# Patient Record
Sex: Female | Born: 1935 | Race: White | Hispanic: No | Smoking: Never smoker
Health system: Southern US, Community
[De-identification: ages and names within clinical notes are randomized; demographics above are authoritative.]

## PROBLEM LIST (undated history)

## (undated) DIAGNOSIS — E43 Unspecified severe protein-calorie malnutrition: Secondary | ICD-10-CM

## (undated) DIAGNOSIS — Z8719 Personal history of other diseases of the digestive system: Secondary | ICD-10-CM

## (undated) DIAGNOSIS — N289 Disorder of kidney and ureter, unspecified: Secondary | ICD-10-CM

## (undated) DIAGNOSIS — S32009A Unspecified fracture of unspecified lumbar vertebra, initial encounter for closed fracture: Secondary | ICD-10-CM

## (undated) DIAGNOSIS — K219 Gastro-esophageal reflux disease without esophagitis: Secondary | ICD-10-CM

## (undated) DIAGNOSIS — M4854XA Collapsed vertebra, not elsewhere classified, thoracic region, initial encounter for fracture: Secondary | ICD-10-CM

## (undated) DIAGNOSIS — K5900 Constipation, unspecified: Secondary | ICD-10-CM

## (undated) DIAGNOSIS — E039 Hypothyroidism, unspecified: Secondary | ICD-10-CM

## (undated) DIAGNOSIS — M545 Low back pain, unspecified: Secondary | ICD-10-CM

## (undated) DIAGNOSIS — S32020D Wedge compression fracture of second lumbar vertebra, subsequent encounter for fracture with routine healing: Secondary | ICD-10-CM

## (undated) DIAGNOSIS — D649 Anemia, unspecified: Secondary | ICD-10-CM

## (undated) DIAGNOSIS — M549 Dorsalgia, unspecified: Secondary | ICD-10-CM

## (undated) DIAGNOSIS — E871 Hypo-osmolality and hyponatremia: Secondary | ICD-10-CM

## (undated) DIAGNOSIS — E785 Hyperlipidemia, unspecified: Secondary | ICD-10-CM

## (undated) DIAGNOSIS — R4020 Unspecified coma: Secondary | ICD-10-CM

## (undated) DIAGNOSIS — N183 Chronic kidney disease, stage 3 (moderate): Secondary | ICD-10-CM

## (undated) DIAGNOSIS — F329 Major depressive disorder, single episode, unspecified: Secondary | ICD-10-CM

## (undated) DIAGNOSIS — I10 Essential (primary) hypertension: Secondary | ICD-10-CM

## (undated) DIAGNOSIS — I4891 Unspecified atrial fibrillation: Secondary | ICD-10-CM

## (undated) DIAGNOSIS — R55 Syncope and collapse: Secondary | ICD-10-CM

## (undated) DIAGNOSIS — I5041 Acute combined systolic (congestive) and diastolic (congestive) heart failure: Secondary | ICD-10-CM

## (undated) DIAGNOSIS — F419 Anxiety disorder, unspecified: Secondary | ICD-10-CM

## (undated) DIAGNOSIS — R109 Unspecified abdominal pain: Secondary | ICD-10-CM

## (undated) HISTORY — PX: ABDOMINAL HYSTERECTOMY: SHX81

## (undated) HISTORY — DX: Disorder of kidney and ureter, unspecified: N28.9

## (undated) HISTORY — DX: Chronic kidney disease, stage 3 (moderate): N18.3

## (undated) HISTORY — DX: Collapsed vertebra, not elsewhere classified, thoracic region, initial encounter for fracture: M48.54XA

## (undated) HISTORY — PX: CATARACT EXTRACTION W/ INTRAOCULAR LENS  IMPLANT, BILATERAL: SHX1307

## (undated) HISTORY — DX: Personal history of other diseases of the digestive system: Z87.19

## (undated) HISTORY — DX: Unspecified abdominal pain: R10.9

## (undated) HISTORY — DX: Unspecified severe protein-calorie malnutrition: E43

## (undated) HISTORY — DX: Unspecified atrial fibrillation: I48.91

## (undated) HISTORY — DX: Acute combined systolic (congestive) and diastolic (congestive) heart failure: I50.41

## (undated) HISTORY — DX: Constipation, unspecified: K59.00

## (undated) HISTORY — DX: Major depressive disorder, single episode, unspecified: F32.9

## (undated) HISTORY — PX: TONSILLECTOMY AND ADENOIDECTOMY: SHX28

## (undated) HISTORY — PX: BREAST LUMPECTOMY: SHX2

## (undated) HISTORY — DX: Wedge compression fracture of second lumbar vertebra, subsequent encounter for fracture with routine healing: S32.020D

---

## 1985-04-15 HISTORY — PX: ABDOMINAL HYSTERECTOMY: SHX81

## 1997-12-23 ENCOUNTER — Other Ambulatory Visit: Admission: RE | Admit: 1997-12-23 | Discharge: 1997-12-23 | Payer: Self-pay | Admitting: *Deleted

## 1998-05-03 ENCOUNTER — Inpatient Hospital Stay (HOSPITAL_COMMUNITY): Admission: EM | Admit: 1998-05-03 | Discharge: 1998-05-05 | Payer: Self-pay | Admitting: Emergency Medicine

## 1998-05-03 ENCOUNTER — Encounter: Payer: Self-pay | Admitting: Cardiology

## 1998-05-04 ENCOUNTER — Encounter: Payer: Self-pay | Admitting: Cardiology

## 1998-10-11 ENCOUNTER — Emergency Department (HOSPITAL_COMMUNITY): Admission: EM | Admit: 1998-10-11 | Discharge: 1998-10-11 | Payer: Self-pay | Admitting: Emergency Medicine

## 1999-02-09 ENCOUNTER — Other Ambulatory Visit: Admission: RE | Admit: 1999-02-09 | Discharge: 1999-02-09 | Payer: Self-pay | Admitting: *Deleted

## 1999-11-19 ENCOUNTER — Encounter: Admission: RE | Admit: 1999-11-19 | Discharge: 1999-11-19 | Payer: Self-pay | Admitting: Cardiology

## 1999-11-19 ENCOUNTER — Encounter: Payer: Self-pay | Admitting: Cardiology

## 2000-01-30 ENCOUNTER — Other Ambulatory Visit: Admission: RE | Admit: 2000-01-30 | Discharge: 2000-01-30 | Payer: Self-pay | Admitting: *Deleted

## 2000-04-22 ENCOUNTER — Emergency Department (HOSPITAL_COMMUNITY): Admission: EM | Admit: 2000-04-22 | Discharge: 2000-04-22 | Payer: Self-pay | Admitting: Emergency Medicine

## 2000-06-19 ENCOUNTER — Ambulatory Visit (HOSPITAL_COMMUNITY): Admission: RE | Admit: 2000-06-19 | Discharge: 2000-06-19 | Payer: Self-pay | Admitting: Gastroenterology

## 2000-09-12 ENCOUNTER — Encounter: Payer: Self-pay | Admitting: Gastroenterology

## 2000-09-12 ENCOUNTER — Encounter: Admission: RE | Admit: 2000-09-12 | Discharge: 2000-09-12 | Payer: Self-pay | Admitting: Gastroenterology

## 2000-09-29 ENCOUNTER — Ambulatory Visit (HOSPITAL_COMMUNITY): Admission: RE | Admit: 2000-09-29 | Discharge: 2000-09-29 | Payer: Self-pay | Admitting: Gastroenterology

## 2000-12-03 ENCOUNTER — Encounter: Admission: RE | Admit: 2000-12-03 | Discharge: 2000-12-03 | Payer: Self-pay | Admitting: Cardiology

## 2000-12-03 ENCOUNTER — Encounter: Payer: Self-pay | Admitting: Cardiology

## 2001-02-23 ENCOUNTER — Other Ambulatory Visit: Admission: RE | Admit: 2001-02-23 | Discharge: 2001-02-23 | Payer: Self-pay | Admitting: *Deleted

## 2001-03-22 ENCOUNTER — Emergency Department (HOSPITAL_COMMUNITY): Admission: EM | Admit: 2001-03-22 | Discharge: 2001-03-22 | Payer: Self-pay | Admitting: Emergency Medicine

## 2001-04-15 HISTORY — PX: COLONOSCOPY: SHX174

## 2001-12-04 ENCOUNTER — Encounter: Admission: RE | Admit: 2001-12-04 | Discharge: 2001-12-04 | Payer: Self-pay | Admitting: Cardiology

## 2001-12-04 ENCOUNTER — Encounter: Payer: Self-pay | Admitting: Cardiology

## 2001-12-09 ENCOUNTER — Encounter: Admission: RE | Admit: 2001-12-09 | Discharge: 2001-12-09 | Payer: Self-pay | Admitting: Cardiology

## 2001-12-09 ENCOUNTER — Encounter: Payer: Self-pay | Admitting: Cardiology

## 2002-08-25 ENCOUNTER — Other Ambulatory Visit: Admission: RE | Admit: 2002-08-25 | Discharge: 2002-08-25 | Payer: Self-pay

## 2002-12-14 ENCOUNTER — Encounter: Admission: RE | Admit: 2002-12-14 | Discharge: 2002-12-14 | Payer: Self-pay | Admitting: Cardiology

## 2002-12-14 ENCOUNTER — Encounter: Payer: Self-pay | Admitting: Cardiology

## 2003-05-10 ENCOUNTER — Ambulatory Visit (HOSPITAL_COMMUNITY): Admission: RE | Admit: 2003-05-10 | Discharge: 2003-05-10 | Payer: Self-pay | Admitting: Gastroenterology

## 2003-09-02 ENCOUNTER — Emergency Department (HOSPITAL_COMMUNITY): Admission: EM | Admit: 2003-09-02 | Discharge: 2003-09-02 | Payer: Self-pay | Admitting: Emergency Medicine

## 2003-12-16 ENCOUNTER — Encounter: Admission: RE | Admit: 2003-12-16 | Discharge: 2003-12-16 | Payer: Self-pay | Admitting: Internal Medicine

## 2005-01-01 ENCOUNTER — Encounter: Admission: RE | Admit: 2005-01-01 | Discharge: 2005-01-01 | Payer: Self-pay | Admitting: Cardiology

## 2006-02-04 ENCOUNTER — Inpatient Hospital Stay (HOSPITAL_COMMUNITY): Admission: EM | Admit: 2006-02-04 | Discharge: 2006-02-06 | Payer: Self-pay | Admitting: Emergency Medicine

## 2006-03-12 ENCOUNTER — Encounter: Admission: RE | Admit: 2006-03-12 | Discharge: 2006-03-12 | Payer: Self-pay | Admitting: Cardiology

## 2007-07-19 ENCOUNTER — Emergency Department (HOSPITAL_COMMUNITY): Admission: EM | Admit: 2007-07-19 | Discharge: 2007-07-19 | Payer: Self-pay | Admitting: Emergency Medicine

## 2007-10-16 ENCOUNTER — Inpatient Hospital Stay (HOSPITAL_COMMUNITY): Admission: EM | Admit: 2007-10-16 | Discharge: 2007-10-26 | Payer: Self-pay | Admitting: Emergency Medicine

## 2007-10-19 ENCOUNTER — Ambulatory Visit: Payer: Self-pay | Admitting: Vascular Surgery

## 2007-10-19 ENCOUNTER — Encounter (INDEPENDENT_AMBULATORY_CARE_PROVIDER_SITE_OTHER): Payer: Self-pay | Admitting: Internal Medicine

## 2007-10-20 ENCOUNTER — Encounter (INDEPENDENT_AMBULATORY_CARE_PROVIDER_SITE_OTHER): Payer: Self-pay | Admitting: Internal Medicine

## 2007-10-21 ENCOUNTER — Encounter (INDEPENDENT_AMBULATORY_CARE_PROVIDER_SITE_OTHER): Payer: Self-pay | Admitting: Gastroenterology

## 2010-05-05 ENCOUNTER — Encounter: Payer: Self-pay | Admitting: Cardiology

## 2010-07-27 ENCOUNTER — Inpatient Hospital Stay (HOSPITAL_COMMUNITY): Payer: Medicare Other

## 2010-07-27 ENCOUNTER — Emergency Department (HOSPITAL_COMMUNITY): Payer: Medicare Other

## 2010-07-27 ENCOUNTER — Inpatient Hospital Stay (HOSPITAL_COMMUNITY)
Admission: EM | Admit: 2010-07-27 | Discharge: 2010-07-31 | DRG: 065 | Disposition: A | Discharge status: Home / Self Care | Payer: Medicare Other | Attending: Internal Medicine | Admitting: Internal Medicine

## 2010-07-27 DIAGNOSIS — I6350 Cerebral infarction due to unspecified occlusion or stenosis of unspecified cerebral artery: Principal | ICD-10-CM | POA: Diagnosis present

## 2010-07-27 DIAGNOSIS — R209 Unspecified disturbances of skin sensation: Secondary | ICD-10-CM

## 2010-07-27 DIAGNOSIS — Z7902 Long term (current) use of antithrombotics/antiplatelets: Secondary | ICD-10-CM

## 2010-07-27 DIAGNOSIS — E785 Hyperlipidemia, unspecified: Secondary | ICD-10-CM | POA: Diagnosis present

## 2010-07-27 DIAGNOSIS — E876 Hypokalemia: Secondary | ICD-10-CM | POA: Diagnosis not present

## 2010-07-27 DIAGNOSIS — I10 Essential (primary) hypertension: Secondary | ICD-10-CM | POA: Diagnosis present

## 2010-07-27 DIAGNOSIS — E236 Other disorders of pituitary gland: Secondary | ICD-10-CM | POA: Diagnosis present

## 2010-07-27 DIAGNOSIS — K219 Gastro-esophageal reflux disease without esophagitis: Secondary | ICD-10-CM | POA: Diagnosis present

## 2010-07-27 DIAGNOSIS — Z8673 Personal history of transient ischemic attack (TIA), and cerebral infarction without residual deficits: Secondary | ICD-10-CM

## 2010-07-27 DIAGNOSIS — Z7982 Long term (current) use of aspirin: Secondary | ICD-10-CM

## 2010-07-27 LAB — URINALYSIS, ROUTINE W REFLEX MICROSCOPIC
Bilirubin Urine: NEGATIVE
Nitrite: NEGATIVE
Specific Gravity, Urine: 1.015 (ref 1.005–1.030)
Urobilinogen, UA: 1 mg/dL (ref 0.0–1.0)

## 2010-07-27 LAB — POCT CARDIAC MARKERS: Myoglobin, poc: 117 ng/mL (ref 12–200)

## 2010-07-27 LAB — COMPREHENSIVE METABOLIC PANEL
AST: 22 U/L (ref 0–37)
BUN: 14 mg/dL (ref 6–23)
CO2: 28 mEq/L (ref 19–32)
Chloride: 88 mEq/L — ABNORMAL LOW (ref 96–112)
Creatinine, Ser: 0.84 mg/dL (ref 0.4–1.2)
GFR calc non Af Amer: >60 mL/min (ref >60)
Total Bilirubin: 0.8 mg/dL (ref 0.3–1.2)

## 2010-07-27 LAB — PROTIME-INR
INR: 1.06 (ref 0.00–1.49)
Prothrombin Time: 14 seconds (ref 11.6–15.2)

## 2010-07-27 LAB — URINE MICROSCOPIC-ADD ON

## 2010-07-27 LAB — CBC
MCHC: 35.8 g/dL (ref 30.0–36.0)
Platelets: 307 10*3/uL (ref 150–400)
RDW: 13.2 % (ref 11.5–15.5)

## 2010-07-27 LAB — HEMOGLOBIN A1C: Hgb A1c MFr Bld: 5.6 % (ref <5.7)

## 2010-07-28 LAB — BASIC METABOLIC PANEL
CO2: 29 mEq/L (ref 19–32)
Calcium: 8.7 mg/dL (ref 8.4–10.5)
Creatinine, Ser: 0.72 mg/dL (ref 0.4–1.2)
GFR calc Af Amer: >60 mL/min (ref >60)
GFR calc non Af Amer: >60 mL/min (ref >60)

## 2010-07-28 LAB — GLUCOSE, CAPILLARY
Glucose-Capillary: 164 mg/dL — ABNORMAL HIGH (ref 70–99)
Glucose-Capillary: 98 mg/dL (ref 70–99)
Glucose-Capillary: 113 mg/dL — ABNORMAL HIGH (ref 70–99)
Glucose-Capillary: 100 mg/dL — ABNORMAL HIGH (ref 70–99)

## 2010-07-28 LAB — CBC
MCH: 31.6 pg (ref 26.0–34.0)
MCHC: 35.1 g/dL (ref 30.0–36.0)
Platelets: 257 10*3/uL (ref 150–400)
RDW: 13.1 % (ref 11.5–15.5)

## 2010-07-28 LAB — LIPID PANEL
Cholesterol: 111 mg/dL (ref 0–200)
HDL: 29 mg/dL — ABNORMAL LOW (ref >39)
LDL Cholesterol: 56 mg/dL (ref 0–99)
Total CHOL/HDL Ratio: 3.8 RATIO
Triglycerides: 128 mg/dL (ref <150)
VLDL: 26 mg/dL (ref 0–40)

## 2010-07-29 LAB — GLUCOSE, CAPILLARY
Glucose-Capillary: 93 mg/dL (ref 70–99)
Glucose-Capillary: 93 mg/dL (ref 70–99)

## 2010-07-29 LAB — CBC
MCH: 31.6 pg (ref 26.0–34.0)
MCV: 89.8 fL (ref 78.0–100.0)
Platelets: 286 10*3/uL (ref 150–400)
RBC: 3.74 MIL/uL — ABNORMAL LOW (ref 3.87–5.11)

## 2010-07-29 LAB — BASIC METABOLIC PANEL
BUN: 12 mg/dL (ref 6–23)
CO2: 28 mEq/L (ref 19–32)
Chloride: 95 mEq/L — ABNORMAL LOW (ref 96–112)
Creatinine, Ser: 0.64 mg/dL (ref 0.4–1.2)

## 2010-07-29 LAB — DIFFERENTIAL
Eosinophils Absolute: 0.1 10*3/uL (ref 0.0–0.7)
Lymphs Abs: 2.3 10*3/uL (ref 0.7–4.0)
Monocytes Absolute: 1.1 10*3/uL — ABNORMAL HIGH (ref 0.1–1.0)
Monocytes Relative: 13 % — ABNORMAL HIGH (ref 3–12)
Neutrophils Relative %: 58 % (ref 43–77)

## 2010-07-30 DIAGNOSIS — I359 Nonrheumatic aortic valve disorder, unspecified: Secondary | ICD-10-CM

## 2010-07-30 LAB — GLUCOSE, CAPILLARY: Glucose-Capillary: 104 mg/dL — ABNORMAL HIGH (ref 70–99)

## 2010-07-30 LAB — BASIC METABOLIC PANEL
CO2: 26 mEq/L (ref 19–32)
Chloride: 97 mEq/L (ref 96–112)
GFR calc Af Amer: >60 mL/min (ref >60)
Sodium: 131 mEq/L — ABNORMAL LOW (ref 135–145)

## 2010-07-30 LAB — CBC
Hemoglobin: 11.2 g/dL — ABNORMAL LOW (ref 12.0–15.0)
RBC: 3.54 MIL/uL — ABNORMAL LOW (ref 3.87–5.11)

## 2010-07-31 LAB — GLUCOSE, CAPILLARY
Glucose-Capillary: 96 mg/dL (ref 70–99)
Glucose-Capillary: 84 mg/dL (ref 70–99)

## 2010-07-31 LAB — BASIC METABOLIC PANEL
BUN: 12 mg/dL (ref 6–23)
Chloride: 95 mEq/L — ABNORMAL LOW (ref 96–112)
Creatinine, Ser: 0.57 mg/dL (ref 0.4–1.2)
GFR calc non Af Amer: >60 mL/min (ref >60)

## 2010-08-08 NOTE — Consult Note (Signed)
[name], [name]            ACCOUNT NO.:  [id number]  MEDICAL RECORD NO.:  [id number]           PATIENT TYPE:  I  LOCATION:  3009                         FACILITY:  [facility]  PHYSICIAN:  Thana Farr, MD    DATE OF BIRTH:  [birth year]  DATE OF CONSULTATION:  07/28/2010 DATE OF DISCHARGE:                                CONSULTATION   HISTORY:  [name] is an 77 year old female who reports that on July 25, 2010, she had 1 episode of numbness involving her left hand and left forearm, lasting a few minutes.  She then had a recurrent episode on July 26, 2010.  On July 27, 2010, the patient had 2 episodes.  They were all similar.  All lasted very short periods of time.  The patient presented for evaluation at that time.  Consult called for further recommendations.  PAST MEDICAL HISTORY:  TIA, lacunar infarct, hypertension, SIADH, hypercholesterolemia, ischemic colitis, GERD, arrhythmia.  MEDICATIONS:  Vitamin D, lorazepam, hydralazine, simvastatin, aspirin, paroxetine, amlodipine, Aciphex, digoxin, lisinopril, metoprolol.  ALLERGIES:  CODEINE.  SOCIAL HISTORY:  The patient is widowed.  She lives at assisted living facility.  She has no history of alcohol, tobacco, or illicit drug abuse.  PHYSICAL EXAMINATION:  VITAL SIGNS:  Blood pressure 154/64, heart rate 61, respiratory rate 16, T-max 97.7, O2 sat 94% on room air. NEUROLOGIC:  On mental status testing, the patient is alert and oriented x3.  Speech is fluent without aphasia.  The patient follow commands without difficulty.  Cranial nerve testing; II, visual fields grossly intact; III, IV, VI, extraocular movements intact; V and VII, smile symmetric; VIII, grossly intact;  IX and X positive gag; XI, bilateral shoulder shrug; XII, midline tongue extension.  On motor exam, the patient is 5/5 throughout.  There is normal tone and bulk.  Sensory, pinprick and light touch are intact bilaterally.  Deep tendon reflexes are  1+ throughout.  Plantars are downgoing bilaterally.  On cerebellar testing, finger-to-nose and heel-to-shin intact.  LABORATORY DATA:  Shows a cholesterol of 111, LDL of 56.  Sodium 127, potassium 4.0, chloride 88, bicarb 28, BUN and creatinine 14 and 0.84 respectively, glucose 114.  White blood cell count 10, platelet count 307, hemoglobin and hematocrit 13.9 and 38.8 respectively.  PT, INR, and PTT 14.0, 1.06, and 25 respectively.  Total bili 0.8, alk phos 73, SGOT and SGPT 22 and 15 respectively.  Protein 8.2, albumin 4.4, calcium 9.6. MRI imaging shows a punctate acute right parietal white matter infarct. MRA shows diffuse atherosclerotic changes, most prominent in the right vertebral artery.  ASSESSMENT:  [name] is an 77 year old female with an acute small right parietal infarct.  She has multiple risk factors of some small vessel disease.  This is likely the etiology of her infarct.  She presented outside the TPA window and therefore she is appropriate at this point for medical management.  PLAN: 1. We will change aspirin to Plavix 75 mg p.o. daily to use for long-     term stroke prophylaxis. 2. Consider long-term cardiac monitoring to make sure that the patient     is not intermittently having arrhythmias.  3. Echocardiogram pending.          ______________________________ Thana Farr, MD     LR/MEDQ  D:  07/28/2010  T:  07/28/2010  Job:  [id number]  Electronically Signed by Thana Farr MD on 08/08/2010 06:16:30 PM

## 2010-08-08 NOTE — Discharge Summary (Signed)
[name]            ACCOUNT NO.:  [id number]  MEDICAL RECORD NO.:  [id number]           PATIENT TYPE:  I  LOCATION:  3009                         FACILITY:  [facility]  PHYSICIAN:  Ramiro Harvest, MD    DATE OF BIRTH:  [birth year]  DATE OF ADMISSION:  07/27/2010 DATE OF DISCHARGE:                        DISCHARGE SUMMARY - REFERRING   PRIMARY CARE PHYSICIAN:  [name]. [name], MD  DISCHARGE DIAGNOSES: 1. Acute small right parietal cerebrovascular accident. 2. Hypokalemia, resolved. 3. Chronic hyponatremia felt secondary to SIADH, stable. 4. Hypertension. 5. Gastroesophageal reflux disease. 6. Hyperlipidemia. 7. History of atherosclerosis. 8. History of transient ischemic attacks. 9. Old lacunar strokes. 10.Status post hysterectomy. 11.History of ischemic colitis in July 2009. 12.History of tachycardic arrhythmia of unclear etiology for which the     patient is on digoxin. 13.History of severe hypertension.14.History of diverticulosis with colonoscopy of June 19, 2000. 15.Status post breast lump biopsy, which was benign. 16.Status post cataract surgery. 17.Status post tonsillectomy and adenoidectomy. 18.Status post total abdominal hysterectomy and bilateral salpingo-     oophorectomy.  DISCHARGE MEDICATIONS: 1. Plavix 75 mg p.o. daily. 2. Aspirin 81 mg p.o. daily x1 week, then stop. 3. Crestor 10 mg p.o. at bedtime. 4. Aciphex 20 mg p.o. daily. 5. Norvasc 2.5 mg p.o. daily. 6. Digoxin 0.125 mg p.o. daily. 7. Hydralazine 25 mg p.o. t.i.d. 8. Lisinopril 20 mg p.o. daily. 9. Ativan 0.5 mg p.o. daily p.r.n. 10.Metoprolol 100 mg p.o. t.i.d. 11.Paxil 20 mg p.o. daily. 12.Vitamin D over-the-counter 1 tablet p.o. b.i.d.  DISPOSITION AND FOLLOWUP:  The patient will be discharged home.  The patient is to follow up with PCP in 1-2 weeks post discharge and follow- up 2-D echo done during this hospitalization will need to be followed up upon.  The patient will need a  repeat BMET done to follow up on electrolytes and renal function.  The patient has been switched from aspirin to her Plavix for her stroke.  She is to continue aspirin for one more week with her Plavix overlapping and stop her aspirin.  CONSULTATIONS DONE:  A Neurology consultation was done.  The patient was seen in consultation by Dr. Thad Ranger on July 28, 2010.  PROCEDURES PERFORMED: 1. MRI, MRA of the head was done July 27, 2010 that showed acute     infarct in the right parietal white matter, atrophy and extensive     chronic small-vessel disease elsewhere throughout the brain,     diffuse intracranial medium to small vessel sclerotic change within     the branch vessels.  No correctable proximal stenosis in the     anterior circulation markedly abnormal flow in the right vertebral     artery.  This vessel either shows minimal thready antegrade flow or     there is retrograde flow in the distal small vessel.  A 2-D echo     was done on July 30, 2010, results were pending at the time of     discharge.  Carotid Dopplers were done July 27, 2010 that showed     no significant extracranial carotid artery stenosis demonstrated on     the  left.  Right ICA demonstrated 40%-59% stenosis.  Vertebrals are     patent with antegrade flow.  BRIEF ADMISSION HISTORY AND PHYSICAL:  [name] is an 77- year-old Caucasian female with history of TIA, old lacunar strokes who presented with complaints of left hand numbness beginning around 1 p.m. The patient then apparently presented to [facility] where she is sent to the ED for further evaluation.  MRI was performed in the ED that showed punctured acute infarctions in the right parietal white matter atrophy and extensive chronic small-vessel disease elsewhere throughout the brain, both internal carotid arteries were widely pain and into the brain and in the anterior middle cerebral vessels were patent without proximal  stenosis, aneurysm or vascular malformation.  There was markedly abnormal fall in the right vertebral artery vessel either shows minimal antegrade flow.  There is retrograde flow in the small distal vessel.  The patient was admitted for stroke workup.  For rest of admission history and physical, please see H and P dictated per Dr. Selena Batten of job [id number].  HOSPITAL COURSE: 1. Acute small right parietal cerebrovascular accident.  The patient     was admitted with acute stroke.  A stroke workup was undertaken.     Carotid Dopplers were obtained with results as stated above which     did show a 40%-59% right ICA stenosis.  A 2-D echo was done on     July 30, 2010 and was pending at the time of discharge and this     will need to be followed up upon per PCP.  A neurology consultation     was obtained.  The patient was seen in consultation by Dr. Thana Farr of Neurology on July 28, 2010.  At that time, it was     recommended to change the patient's aspirin to Plavix for long-term     stroke prophylaxis and the patient was monitored.  The patient     improved clinically.  During the hospitalization, she was seen by PT and OT, remained in stable condition, was maintained on aspirin.     She will be continued on aspirin for 1 week which was subsequently     be discontinued and the patient will continue on Plavix.  The     patient will be discharged in stable and improved condition to     follow up with PCP as an outpatient. 2. Hypokalemia.  The patient was noted to be hypokalemic.  During the     hospitalization, the patient's potassium was repleted and     hypokalemia had resolved by day of discharge.  The rest of the     patient's chronic medical issues have remained stable throughout     the hospitalization.  The patient will be discharged in stable and     improved condition.  VITAL SIGNS:  On day of discharge, vital signs temperature 98.0, pulse of 62, respirations 20, blood  pressure 129/63, satting 96% on room air.  DISCHARGE LABS:  Sodium 129, potassium 3.9, chloride 95, bicarb 28, glucose 91, BUN 12, creatinine 0.57, calcium of 8.8.  It has been a pleasure taking care of [name].     Ramiro Harvest, MD     DT/MEDQ  D:  07/31/2010  T:  07/31/2010  Job:  [id number]  cc:   [name]. [name], M.D. Thana Farr, MD  Electronically Signed by Ramiro Harvest MD on 08/08/2010 05:57:16 PM

## 2010-08-28 NOTE — Discharge Summary (Signed)
[name], [name]            ACCOUNT NO.:  [id number]   MEDICAL RECORD NO.:  [id number]          PATIENT TYPE:  INP   LOCATION:  1432                         [facility]ACILITY[facility]   PHYSICIAN:  [name], M.D.       DATE OF BIRTH:  December 13, 1918   DATE OF ADMISSION:  10/16/2007  DATE OF DISCHARGE:  10/26/2007                               DISCHARGE SUMMARY   PRIMARY CARE PHYSICIAN:  Dr. Murray Hodgkins.   DISCHARGE DIAGNOSES:  1. Ischemic colitis.  2. Severe hypertension stage III.  3. Hypokalemia resolved.  4. Known atherosclerosis.  5. Transient ischemic attacks.  6. Gastroesophageal reflux disease.  7. Hyperlipidemia.  8. Old lacunar strokes.  9. Syndrome of inappropriate antidiuretic hormone secretion with      chronic mild hyponatremia.  10.Status post hysterectomy.   DISCHARGE MEDICATIONS:  1. Metoprolol 100 mg by mouth three times a day.  2. Lisinopril 20 mg daily.  3. Hydralazine 25 mg three times a day.  4. Ativan 0.5 mg twice a day.  5. Aciphex 20 mg daily.  6, Reglan 5 mg three times a day as needed for nausea.  1. Tylenol 650 mg by mouth every six hours as needed for pain.  2. Zocor 40 mg at bedtime.  3. Digoxin 0.125 mg daily.  4. Refusing Aspirin   CONDITION AT DISCHARGE:  [name] transferred to [facility] skilled  nursing facility for rehabilitation prior to discharge back to her  independent living apartment.   PROCEDURES DURING THIS ADMISSION:  1. On October 17, 2007 the patient underwent CT scan of abdomen and pelvis      with intravenous contrast.  Findings of colitis and extensive      hepatic cystic disease.  2. On October 21, 2007 the patient underwent sigmoidoscopy by Dr. Dorena Cookey with findings of segmental colitis.  Pathology confirming      ischemic colitis.   CONSULTATIONS DURING THIS ADMISSION:  The patient was seen in  consultation by the [facility] gastroenterology group.   History and Physical, refer to the dictated H&P which was done on  October 16, 2007 by Dr. Vania Rea.   HOSPITAL COURSE:  For the first week of hospitalization refer to the  brief Discharge Summary dictated by Dr. Toniann Fail on October 21, 2007.  For  the course of the hospitalization from July 9 until October 26, 2007, refer  to the current dictation.  Starting October 22, 2007  [name] has  continued her recovery from the episode of ischemic colitis.  She was  continued on ciprofloxacin and Flagyl and completed 10 days of these  antibiotics.  Her hypokalemia and hypomagnesemia have resolved.  She did  not have any tachyarrhythmias, and her digoxin and metoprolol were  continued at her home doses.  The patient was also evaluated for her  right elbow pain, and it was felt that she had a small avulsion of her  tendon from the medial epicondylus.  This will need rest, icing, and  physical therapy.  [name] was offered aspirin for her extensive atherosclerotic  disease, but she did refuse that treatment.  I have explained to the  patient the risk of heart attack, stroke or bowel infarct, but she  continued to refuse her aspirin.  Her primary care physician may want to  approach this issue again.  [name]' hyponatremia has been stable, and we would also recommend  free water restriction at the skilled nursing facility.  Following this prolonged hospitalization this frail elderly woman she  has become deconditioned and weak.  For this reason she is transferred  to a skilled nursing facility today, October 26, 2007, in stable condition.      [name], M.D.  Electronically Signed     SL/MEDQ  D:  10/26/2007  T:  10/26/2007  Job:  [id number]   cc:   [name]. [name], M.D.  Fax: [phone]

## 2010-08-28 NOTE — Op Note (Signed)
[name], [name]            ACCOUNT NO.:  [id number]   MEDICAL RECORD NO.:  [id number]          PATIENT TYPE:  INP   LOCATION:  1432                         [facility]ACILITY:  [facility]   PHYSICIAN:  [name], M.D.    DATE OF BIRTH:  February 25, 1919   DATE OF PROCEDURE:  10/21/2007  DATE OF DISCHARGE:                               OPERATIVE REPORT   PROCEDURE:  Flexible sigmoidoscopy with biopsy.   INDICATIONS FOR PROCEDURE:  Abdominal pain with segmental colitis  suggested on CT scan.   PROCEDURE:  The patient was placed in the left lateral decubitus  position and placed on the pulse monitor with continuous low-flow oxygen  delivered by nasal cannula.  She was sedated with 45 mcg IV fentanyl and  4 mg IV Versed.  The Olympus video colonoscope was inserted into the  rectum and advanced to about 70 cm.  From 70-50 cm the mucosa appeared  normal.  At that point there was transition into variable intensity of  colitis characterized by adherent exudate, submucosal hemorrhage, edema,  friability and granularity with some areas somewhat resembling  pseudomembranes but for the most part appearance overall most suggestive  of ischemic colitis.  Multiple biopsies were taken.  There was a  transition zone from 30-20 cm to normal mucosa and the rectum appeared  normal.  There were a few scattered diverticula seen.  No mass or  visible suspicion of neoplasm was appreciated.  The scope was then  withdrawn and the patient returned to the recovery room in stable  condition.  She tolerated the procedure well.  There were no immediate  complications.   IMPRESSION:  Segmental colitis most consistent with ischemic colitis  greater than pseudomembranous colitis.   PLAN:  Await biopsy results.  Continue present antibiotics including  Cipro and Flagyl.           ______________________________  Everardo All. Madilyn Fireman, M.D.     JCH/MEDQ  D:  10/21/2007  T:  10/21/2007  Job:  [id number]

## 2010-08-28 NOTE — H&P (Signed)
[name], [name]            ACCOUNT NO.:  [id number]   MEDICAL RECORD NO.:  [id number]          PATIENT TYPE:  INP   LOCATION:  0101                         FACILITY:  [facility]   PHYSICIAN:  Vania Rea, M.D. DATE OF BIRTH:  [birth year]   DATE OF ADMISSION:  10/16/2007  DATE OF DISCHARGE:                              HISTORY & PHYSICAL   PRIMARY CARE PHYSICIAN:  [name]. [name], M.D.   CHIEF COMPLAINT:  Sudden onset of abdominal pain and weakness.   HISTORY OF PRESENT ILLNESS:  This is an 77 year old Caucasian lady,  resident of an assisted living facility, with a past history of  hypertension, TIA and cardiac rhythm of unclear diagnosis, who was  brought into emergency room after the sudden onset of dull abdominal  pain which caused her to fall to the floor, using her walker, and after  she called for help, the people at the home where she lives brought her  to the emergency room.  The patient denies any nausea, vomiting, or  diarrhea.  She denies any previous similar episode.  There is no history  of constipation.  There is no previous history of syncope.   In the emergency room the patient asked for bedpan and passed a small  amount of bloody, brown stool.   PAST MEDICAL HISTORY:  Hypertension.  History of TIA.  Tachy rhythm of  unclear etiology for which she takes Digoxin, GERD, hyperlipidemia.   MEDICATIONS:  Metoprolol 100 mg 3 times daily, lisinopril 20 mg daily,  hydralazine 25 mg twice daily, digoxin 0.125 mg daily.  Aspirin 325 mg  daily.  Reglan 5 mg daily.  Aciphex 20 mg daily.  Simvastatin 40 mg at  bedtime.   REVIEW OF SYSTEMS:  The patient is anorexic for some time.  She  ambulates with a walker.   FAMILY HISTORY:  Significant for hypertension.  Negative for stroke.   SOCIAL HISTORY:  She is a resident of an assisted living facility.  Denies tobacco, alcohol, or illicit drug use.   ALLERGIES:  SHE IS ALLERGIC TO CODEINE.   PHYSICAL EXAMINATION:   GENERAL:  Pleasant, thin, elderly lady, seems  somewhat apprehensive, but is in no respiratory distress.  VITAL SIGNS:  Temp was 96.9.  Pulse 72 and regular.  Respirations 20.  Blood pressure 160/62.  HEENT:  Pupils are round and equal.  Mucous membranes are pink.  There  is no lymphadenopathy, no thyromegaly, no jugular venous distension.  No  carotid bruit.  CHEST:  Clear to auscultation bilaterally.  CARDIOVASCULAR:  Regular rhythm, without murmur.  ABDOMEN:  She is tender bilaterally in the lower quadrants, especially  in the left lower colon.  EXTREMITIES:  She has no edema, 1+ pulses bilaterally.  Toes are warm.  CENTRAL NERVOUS SYSTEM:  Cranial nerves 2-12 are grossly intact.  She  has no focal neurological deficits.   LABORATORY:  Her white count is 10.8.  Hemoglobin is 13.9.  Platelets  313.  Sodium 128, potassium 3.5, chloride 94, CO2 25, BUN 21, creatinine  0.85.  Her glucose is 139.  LFTs are normal.  PT, PTT and  INR are  normal.  Her digoxin level was 0.2.   ASSESSMENT:  1. Acute abdominal pain.  2. Acute lower gastrointestinal bleed.  3. Possibility of diverticulitis.  4. Possibility of ischemic bowel disease, especially given her      cardiovascular history.  5. Hypertension, uncontrolled.  6. History of syndrome of inappropriate antidiuretic hormone.  7. Mild hypoglycemia.  8. Dehydration as evidence by elevated BUN and creatinine ratio.   PLAN:  Will check her serum lactic acid.  Will hydrate her and get a CAT  scan of the abdomen and pelvis with and without contrast. Will monitor  serial ABGs and consult the GI service.  Dr. Ewing Schlein contacted.      Vania Rea, M.D.  Electronically Signed     LC/MEDQ  D:  10/16/2007  T:  10/16/2007  Job:  [id number]   cc:   [name]. [name], M.D.  Fax: [phone]   Petra Kuba, M.D.  Fax: [phone]

## 2010-08-28 NOTE — Discharge Summary (Signed)
[name], [name]            ACCOUNT NO.:  [id number]   MEDICAL RECORD NO.:  [id number]          PATIENT TYPE:  INP   LOCATION:  1432                         [facility]AC[facility]   PHYSICIAN:  Eduard Clos, MDDATE OF BIRTH:  May 20, 1918   DATE OF ADMISSION:  10/16/2007  DATE OF DISCHARGE:                               DISCHARGE SUMMARY   This is an interim discharge summary.   COURSE IN THE HOSPITAL:  An 77 year old female with a history of  hypertension, history of TIA, history of tachyarrhythmia,  hyperlipidemia, GERD presented to the ER complaining of abdominal pain  which was sudden in onset.  In the ER the patient had a bloody stool  movement.  The patient had a CAT scan of the abdomen and pelvis done  which showed severe colitis involving the distal transverse and  descending colon.  Differential diagnosis including ischemia,  inflammatory bowel, and infectious etiologies.  The patient was placed  on n.p.o. with IV fluids.  Gastroenterology consult was obtained.  The  patient was placed on empiric antibiotics.  After adequate bowel rest,  the patient underwent sigmoidoscopy.  As per the gastroenterologist, was  felt to be having features compatible with ischemic colitis.  Biopsies  have been sent now.  At this time the patient's blood pressure also was  found to be high, for which lisinopril dose was increased and Norvasc  added.  The patient has a known history of SIADH with hyponatremia.  Her  sodium is staying around 139 at this moment.  Urine osmolarity was high.  Presently we are waiting for the patient's progress with oral diet and  oral medications and pending biopsies.  Further recommendations and  discharge medication to be dictated at the time of discharge by the  discharging MD.   FINAL DIAGNOSES:  1. Probable ischemic colitis.  2. Hyponatremia from syndrome of inappropriate antidiuretic hormone      and dehydration.  3. Hypertension, uncontrolled.  4.  Hyperlipidemia.   PLAN:  Pending biopsies and to check with the progress of the patient as  she has been just started on an oral diet, and further recommendations  will be dictated at the time of discharge by the discharging MD.      Eduard Clos, MD  Electronically Signed     ANK/MEDQ  D:  10/21/2007  T:  10/21/2007  Job:  [id number]

## 2010-08-28 NOTE — Consult Note (Signed)
NAME:  [name], [name]            ACCOUNT NO.:  [id number]   MEDICAL RECORD NO.:  [id number]          PATIENT TYPE:  INP   LOCATION:  1432                         FACILITY:  [facility]   PHYSICIAN:  Petra Kuba, M.D.    DATE OF BIRTH:  February 26, 1919   DATE OF CONSULTATION:  10/17/2007  DATE OF DISCHARGE:                                 CONSULTATION   HISTORY:  The patient is seen at the request of the [facility] service  for abdominal pain and bright red blood per rectum, which came on  suddenly.  She does have chronic constipation, which she does not take  anything for and chronic nausea.  Her last colonoscopy based on the  computer was March 2002, by Dr. Randa Evens that showed some diverticular  disease.  She had an endo x2 in 2002 and 2005, without significant  findings.  She has had some chronic nausea as well.  The pain is new for  her and she has not seen any blood previous in the bowels.  This did  come on all of a sudden.  CAT scan done last night is compatible with  ischemic colitis, which is what I thought she had based on the  discussion with the [facility] doctor last night.   PAST MEDICAL HISTORY:  1. Hypertension.  2. TIAs.  3. Tachyarrhythmias.  4. Reflux.  5. Increased lipids.   HOME MEDICATIONS:  Lopressor, lisinopril, hydralazine, digoxin, aspirin,  Reglan, Aciphex and simvastatin.   REVIEW OF SYSTEMS:  Pertinent for no obvious fever, not having any  vomiting at home, although did vomit after a little bit of Jello this  morning.   FAMILY HISTORY:  Noncontributory.   SOCIAL HISTORY:  Does not smoke or drink.  Lives in assisted living.   ALLERGIES:  CODEINE.   PHYSICAL EXAMINATION:  GENERAL:  No acute distress, lying comfortably in  bed.  VITAL SIGNS:  Stable, afebrile.  ABDOMEN:  Pertinent for her abdomen being particularly tender, but she  is softer on the right, more tender on the left.  She does have  occasional positive bowel sounds.  No rebound tenderness,  very minimal  guarding.  EXTREMITIES:  No pedal edema.  Decreased but present pulses bilaterally.   LABORATORY DATA:  Pertinent for a normal white count, normal hemoglobin,  normal BUN and creatinine, normal liver tests, normal coags.  CT scan  reviewed compatible with ischemic colitis.   ASSESSMENT:  1. Multiple medical problems with history of significant vascular      disease.  2. Probable ischemic colitis.   PLAN:  Follow CBCs and exams.  Consider a flex-sig in a few days when  the pain is better to confirm the diagnosis and surgical consult if the  patient worsens.  May slowly advance diet when she is better over the  next few days.  Dr. Randa Evens will see the patient tomorrow.  Could use  Lovenox or subcu heparin in a few days when there is no signs of any  further active bleeding for prophylaxis or possibly even restart her  aspirin at that junction.  Will follow with you.  Thank you very much.           ______________________________  Petra Kuba, M.D.     MEM/MEDQ  D:  10/17/2007  T:  10/17/2007  Job:  [id number]   cc:   Fayrene Fearing L. Malon Kindle., M.D.  Fax: [phone]   [name]. [name], M.D.  Fax: [phone]

## 2010-08-30 NOTE — H&P (Signed)
[name], [name]            ACCOUNT NO.:  [id number]  MEDICAL RECORD NO.:  [id number]           PATIENT TYPE:  I  LOCATION:  3009                         FACILITY:  [facility]  PHYSICIAN:  Massie Maroon, MD        DATE OF BIRTH:  1919-03-28  DATE OF ADMISSION:  07/27/2010 DATE OF DISCHARGE:                             HISTORY & PHYSICAL   CHIEF COMPLAINT:  Left hand numbness.  HISTORY OF PRESENT ILLNESS:  An 77 year old female with a history of TIA, old lacunar strokes, apparently complains of the left hand numbness beginning about 1:00 p.m.Marland Kitchen  She then apparently went to Urgent Care at [facility] and was sent to the ED for further evaluation.  An MRI was performed in the ED and showed punctate acute infarctions in the right parietal white matter, atrophy and extensive chronic small vessel disease elsewhere throughout the brain.  Both internal carotid arteries were widely patent into the brain and in the anterior middle cerebral vessels were patent without proximal stenosis, aneurysm, or vascular malformation.  There was markedly abnormal  fall in the right vertebral artery.  This vessel either shows minimal antegrade flow or there is retrograde flow in the small distal vessel.  The patient will be admitted for workup of CVA.  PAST MEDICAL HISTORY: 1. Old lacunar strokes. 2. TIA. 3. Severe hypertension. 4. Hypokalemia. 5. 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 6. SIADH with chronic mild hyponatremia. 7. Hyperlipidemia. 8. Tachycardic arrhythmia of unclear etiology, for which he takes     digoxin in the past. 9. Ischemic colitis. 10.GERD. 11.Hysterectomy. 12.Colonoscopy, June 19, 2000, diverticulosis. 13.EGD, September 29, 2000, normal. 14.Flexible sigmoidoscopy, October 21, 2007, segmental colitis consistent     with ischemic colitis greater than pseudomembranous colitis.  PAST SURGICAL HISTORY: 1. Breast lump biopsy - benign. 2. Cataracts. 3. T and A. 4. TAH-BSO.  SOCIAL HISTORY:   The patient lives at assisted living in a friend's home.  She is widowed.  She has 1 daughter, [name].  She does not smoke or drink.  Elijah Birk is her contact person.  FAMILY HISTORY:  Mother is deceased of unknown causes.  Father died in his 30s of blood poisoning.  ALLERGIES:  CODEINE - nausea.  MEDICATIONS:  See med rec. 1. Vitamin D 1 p.o. b.i.d. 2. Lorazepam 0.5 mg p.o. daily p.r.n. 3. Hydralazine 25 mg p.o. t.i.d. 4. Simvastatin 40 mg p.o. nightly. 5. Enteric-coated aspirin 81 mg p.o. daily. 6. Paroxetine 20 mg p.o. daily. 7. Amlodipine 2.5 mg p.o. daily. 8. Aciphex 20 mg p.o. daily. 9. Digoxin 0.125 mg p.o. daily. 10.Lisinopril 20 mg p.o. daily. 11.Metoprolol 100 mg p.o. t.i.d.  REVIEW OF SYSTEMS:  Negative for all 10 organ systems except for pertinent positives as stated above.  PHYSICAL EXAMINATION:  VITAL SIGNS:  Temperature 97.5, pulse 69, blood pressure 176/68, pulse ox 96% on room air. HEENT:  Anicteric, EOMI.  No nystagmus.  Pupils 1.5 mm, symmetric, direct, consensual, near reflexes intact.  Mucous membranes moist. NECK:  No JVD, no bruit, no thyromegaly, no adenopathy. HEART:  Regular rate and rhythm.  S1, S2.  No murmurs, gallops, or rubs.  LUNGS:  Clear to auscultation bilaterally. ABDOMEN:  Soft, nontender, nondistended.  Positive bowel sounds. EXTREMITIES:  No cyanosis, clubbing or edema. SKIN:  No rashes. LYMPH NODES:  No adenopathy. NEUROLOGIC:  Nonfocal.  Cranial nerves II through XII intact.  Reflexes 2+, symmetric, diffuse with downgoing toes bilaterally, motor strength 5/5 in all 4 extremities, pinprick intact.  LABORATORY DATA:  WBC 10.0, hemoglobin 13.9, platelet count 307, PTT 25, troponin-I is less than 0.05.  Sodium 127, potassium 4.0, BUN 14, creatinine 0.84, AST 22, ALT 15.  Urinalysis, wbc's 3-6.  Hemoglobin A1c 5.6.  INR 1.06.  ASSESSMENT AND PLAN: 1. Cerebrovascular accident:  Check carotid ultrasound, cardiac 2-D      echo. 2. Hypertension.  Continue home blood pressure medication. 3. Gastroesophageal reflux disease.  Continue PPI. 4. Hyperlipidemia:  Continue simvastatin.    Massie Maroon, MD    JYK/MEDQ  D:  07/28/2010  T:  07/28/2010  Job:  [id number]  Electronically Signed by Pearson Grippe MD on 08/30/2010 06:50:29 AM

## 2010-08-31 NOTE — Consult Note (Signed)
[name], [name]            ACCOUNT NO.:  [id number]   MEDICAL RECORD NO.:  [id number]          PATIENT TYPE:  INP   LOCATION:  4714                         FACILITY:  [facility]   PHYSICIAN:  Michael L. Reynolds, M.D.DATE OF BIRTH:  07-Jun-1918   DATE OF CONSULTATION:  02/04/2006  DATE OF DISCHARGE:                                   CONSULTATION   REQUESTING PHYSICIAN:  Dr. Eda Paschal in [place].   PRIMARY CARE PHYSICIAN:  Dr. Frederik Pear and Dr. Ronny Flurry.   REASON FOR EVALUATION:  Possible stroke.   HISTORY OF PRESENT ILLNESS:  This is the initial inpatient consultation  evaluation of this 77 year old woman with past medical history which  includes hypertension.  The patient reports that she went to bed feeling  well last night.  She then got up at some point during the night, perhaps  around midnight, and felt that something was not exactly right.  The next  morning, she was noted to have some difficulty with speech.  The patient  says that her speech was not slurred so much as she had difficulty finding  words.  She did not notice any focal numbness, weakness, or paresthesias or  any visual change, any headache, or any other alteration in her normal  neurologic functioning.  She came to the emergency room this morning and was  subsequently admitted for further consideration.  She had a CT in the  emergency room, of which I personally reviewed, which did not show acute  findings.  The patient says that she is doing better at this time.  She  thinks that her difficulty with speech has improved, although it is not  entirely resolved.  She says she has never had any event like this before.   PAST MEDICAL HISTORY:  Remarkable for:  1. Hypertension under uncertain control.  2. She has a history of some rapid heart rhythms, for which she takes      digoxin.  3. She has a hiatal hernia, for which she takes medications in the      evening.   FAMILY HISTORY:  Specifically negative  for stroke.   SOCIAL HISTORY:  She resides at [facility].  She has no smoking history.   REVIEW OF SYSTEMS:  Per HPI and the admission nursing records, which I  reviewed.   MEDICATIONS:  Prior to admission, she was taking:  1. Metoprolol.  2. Lisinopril.  3. Reglan 5 mg q.h.s.  4. Digoxin  5. AcipHex.  6. She says she was on no antiplatelet therapies, including aspirin.   PHYSICAL EXAMINATION:  VITAL SIGNS:  Temperature 98.7, blood pressure 232/97  down to 181/91, pulse of 60, respirations 20.  GENERAL:  This is a healthy-appearing woman, supine, in no evident distress.  HEENT:  Cranium is normocephalic and atraumatic.  Oropharynx is benign.  NECK:  Supple without carotid or supraclavicular bruits.  HEART:  Regular rate and rhythm without murmurs.  NEUROLOGICAL:  Mental status:  She is awake, alert, and fully oriented to  time, place and person.  Recent and remote memory are intact.  Attention  span and  concentration are appropriate.  Her speech, with very rare  exceptions, is fluent and she is able to name objects and repeat phrases  without difficulty.  Cranial nerves:  Funduscopic exam is benign.  Pupils  are equal and briskly reactive.  Extraocular movements full without  nystagmus.  Visual fields are full to confrontation.  Hearing is intact to  conversational speech.  Face, tongue, and palate move normally and  symmetrically.  Shoulder shrug strength is normal.  Motor testing:  Normal  bulk and tone.  Normal strength, no tenseness of any muscles.  Sensation is  intact to light touch and double simultaneous stimulation in all  extremities.  Coordination:  Rapid movements are performed well.  Finger-to-  nose and heel-to-shin are performed well.  Gait:  She arises easily from a  chair and her stance is normal.  She is able to walk with minimal  assistance.  Reflexes:  2+ throughout, toes are downgoing bilaterally.   LABORATORY DATA:  CBC is unremarkable.  CMET is remarkable  for a low sodium  of 125, otherwise normal chemistries.  CT of the head is personally  reviewed, and the study demonstrates moderate atrophy and white matter  disease with an old lacune in the right caudate head, but no acute findings.   IMPRESSION:  Transient aphasia, now improved.  This is very suspicious for a  left brain transient ischemic attack or stroke event.  It it much less  likely that hyponatremia or another problem could have caused this.   RECOMMENDATIONS:  Given that she has not been on aspirin to this point,  would use aspirin for stroke prophylaxis.  She needs a workup including MRI,  MRA, carotid and transcranial Dopplers, a 2-D echocardiogram and stroke  labs.  Stroke service to follow.   Thank you for this consultation.      Michael L. Thad Ranger, M.D.  Electronically Signed     MLR/MEDQ  D:  02/04/2006  T:  02/05/2006  Job:  [id number]   cc:   [name]. [name], M.D.  Cassell Clement, M.D.

## 2010-08-31 NOTE — Discharge Summary (Signed)
NAMEMARLAYA, Bridges            ACCOUNT NO.:  [id number]   MEDICAL RECORD NO.:  [id number]          PATIENT TYPE:  INP   LOCATION:  4714                         FACILITY:  [facility]   PHYSICIAN:  Lonia Blood, M.D.       DATE OF BIRTH:  1918-12-21   DATE OF ADMISSION:  02/04/2006  DATE OF DISCHARGE:  02/06/2006                                 DISCHARGE SUMMARY   PRIMARY CARE PHYSICIAN:  Dr. Murray Hodgkins.   DISCHARGE DIAGNOSES:  1. Transient aphasia,  resolved.  2. Left internal carotid artery 50% stenosis; severe bilateral external      carotid arteries stenoses  3. Sixty-five percent stenosis of the right middle cerebral artery.  4. Severe hypertension.  5. Old lacunar infarct.  6. Hyperlipidemia.  7. Syndrome of inappropriate antidiuretic hormone.  8. Moderate hyponatremia on admission with a sodium level of 126, improved      to a sodium level of 131 on discharge.  9. Status post hysterectomy.   DISCHARGE MEDICATIONS:  1. Metoprolol 100 mg by mouth twice a day.  2. Lisinopril 20 mg daily.  3. Hydralazine 25 mg twice a day.  4. Digoxin 0.125 mg daily.  5. Aciphex 20 mg daily.  6. Aspirin 325 mg daily.  7. Zocor 40 mg at bedtime.   CONDITION ON DISCHARGE:  Mr. [name] was discharged in good condition.  At  the time of discharge, she was alert, oriented with stable vital signs and  controlled blood pressure levels.  She was discharged with a completely  normal neurological exam.  The patient was instructed to follow up with her  primary care physician, Dr. Murray Hodgkins.   PROCEDURES DURING THIS ADMISSION:  1. [facility] 1007, the patient underwent computed tomography scan of      the head with chronic ischemic change findings and no acute infarct      seen.  2. October24 2007, MRI/MRA with no acute infarct findings, scattered      lacunar infarcts, old in nature, small vessel-type disease and 65%      stenosis of the right middle cerebral artery as well as 50% stenosis of      the right internal carotid artery.  3. Echocardiogram, results of which are pending at the time my dictation.   CONSULTATION:  During this admission, the patient was seen in consultation  by Dr. Thad Ranger from Neurology.Marland Kitchen   HISTORY AND PHYSICAL:  For history and physical, refer to the dictated H&P  done by Dr. Denver Faster, 2007.  Briefly, [name] is an 77-year-  old woman with history of hypertension, who presented to [facility]  Emergency Room after new onset of aphasia.  By the time the patient arrived  to the emergency room, she was actually able to speak.  Upon arrival in the  emergency room, the patient had severe hypertension with blood pressure  level measured up to 232/97.  The patient was admitted with possible TIA,  for further workup and observation.   HOSPITAL COURSE:  PROBLEM #1 - APHASIA:  [name] had sudden-onset  aphasia on October23, 2007 in the morning.  This was extremely worrisome  for a possibility of transient ischemic attack.  The patient was admitted to  [facility] and she had her blood pressure medications adjusted.  She was also started on aspirin immediately.  The patient had a full  evaluation for the possibility of an acute stroke with carotid Dopplers,  transthoracic echocardiogram, MRI and MRA of the brain.  The findings though  of the above studies did not indicate that the patient sustained a TIA,  because her symptoms were on the left side of the brain and the findings on  the MRA were on the right side of the brain.  In any case, it is difficult  to explain the patient's symptoms, but she could have had symptoms related  to worsening hyponatremia versus symptoms related to hypertensive urgency.  A consultation was provided from Dr. Thad Ranger from Neurology and he agreed  with the above assessment.  For now, a recommendation was made to continue  tight blood pressure control and to start aspirin 325 mg daily.  During this   admission, the patient was also found to have elevated LDL levels with a  fasting lipid panel indicating a total cholesterol of 191 and LDL of 123.  For this reason, the patient was started on simvastatin at the 40 mg at  bedtime.Marland Kitchen   PROBLEM #2 - HYPONATREMIA:  [name] was felt to have hyponatremia  related to SIADH with a urine osmolality of 410.  The patient received  initially intravenous fluids, which helped to correct her sodium from a  level of 125 on the day of admission to a level of 131 on the day of  discharge.      Lonia Blood, M.D.  Electronically Signed     SL/MEDQ  D:  02/06/2006  T:  02/08/2006  Job:  [id number]   cc:   [name]. [name], M.D.

## 2010-08-31 NOTE — H&P (Signed)
NAME:  [name], [name]            ACCOUNT NO.:  02/05/2006   MEDICAL RECORD NO.:  [id number]          PATIENT TYPE:  INP   LOCATION:  4714                         FACILITY:  [facility]   PHYSICIAN:  Hind I Elsaid, MD      DATE OF BIRTH:  Jul 07, 1918   DATE OF ADMISSION:  02/04/2006  DATE OF DISCHARGE:                                HISTORY & PHYSICAL   PRIMARY CARE PHYSICIAN:  [name]. [name], M.D.   ADMITTING COMPLAINT:  Difficulty findings words with slurred speech that  started at 10:00 p.m. yesterday.   HISTORY OF PRESENT ILLNESS:  She is an 77 year old female with a history of  hypertension.  She presented to the emergency room today after she noted  yesterday, according to her she went to bed last night with good health  without any complaints and then she woke up around 10:00 p.m. with  difficulty finding a word and she noticed slurred speech too.  Today the  patient is seen by the nurse and family members and they document that the  patient has difficulty findings the words and instead of saying I would  like to talk to you she would say I would like to stalk to you and also  there is documentation here at the emergency room that the patient cannot  mention the nurse.  She says, I would like to talk to the sturn instead of  saying I would like to talk to the nurse.  The nephew at the bedside, he  also noted that the patient has difficulty finding the words.  She cannot  remember her granddaughter's name.  She knows their faces but she cannot say  the name of her daughter when she could say this on Friday.  The patient  denies any headache.  Denies any loss of bowel control.  Denies any shift in  her face or drooling of saliva.  The patient denies any motor or sensory  deficits at that time.  Denies any nausea or vomiting.  Denies any blurring  of vision.   PAST MEDICAL HISTORY:  History of hypertension.   ALLERGIES:  No known drug allergies.   MEDICATIONS AT HOME:  1.  Metoprolol 100 mg p.o. t.i.d.  2. Lisinopril 20 mg p.o. daily.  3. Reglan 5 mg p.o. at bedtime.  4. Digoxin 0.125 mg p.o. daily.  5. Aciphex 20 mg p.o. daily.   PAST SURGICAL HISTORY:  Hysterectomy.   FAMILY HISTORY:  Positive for stroke and history of hypertension.   PHYSICAL EXAMINATION:  VITAL SIGNS:  On examination in the emergency room  the patient's vital signs are temperature 98.7, blood pressure was really  high 232/97, heart rate of 67, respiratory rate of 18 and pulse oximetry 95%  on room air.  GENERAL:  On general examination of the patient, the patient looked to be  alert, oriented times 2-3.  She knows where she is.  She knows her nephew  and she knows me as her physician.  HEENT:  Normocephalic, atraumatic.  Extraocular muscle movement is  completely normal.  There is no nystagmus and I noticed  that the left eye is  smaller than the right eye but, according to her, this is an old finding and  she uses eye glasses.  On examination of the face, there is mild asymmetry  of the face mainly the left eye.  As I mentioned before the left eye looks  smaller than the right eye.  There is asymmetry of the nasal labia which is  very mildly appreciated at the nasal labia fold at the left side.  The  patient can smile.  I did not appreciate any change on her smile.  All her  cranial nerves from I-XII looked to be  intact except mild nasal labia asymmetry especially at the left side.  She  can move her neck.  She can move all her extremities.  5/5.  Sensation  intact all over the body.  Reflexes are 2+ and Babinski sign equivocal.  Gait was not done because of the patient lying on the bed.  She has a little  twitching of her hand and according to her this is an old movement.  LUNGS:  Normal, regular breathing.  Equal air entry and percussion was  normal.  HEART:  S1 and S2 within normal.  No murmur or gallop.  ABDOMEN:  Soft and nontender.  Bowel sounds are normal.  EXTREMITIES:   There is a peripheral pulse and there is no lower extremity  edema.  MUSCULOSKELETAL:  There is no joint swelling and there are no rashes.  There  is no area of point tenderness.   LAB FINDINGS:  The patient has a sodium of 125, potassium of 3.8, chloride  of 90, bicarb of 27, BUN of 12 and creatinine of 0.6.  Glucose of 99.  UA  was negative.  Myoglobin is 79.3. CK-MB 1.1 which is negative. Troponin is  0.05 and calcium of 9.  Hematocrit of 43% and hemoglobin of 14.6.  A pH  venous 7.49, pCO2 39 and bicarb 30.  The patient had a head CT scan which,  other than chronic ischemic changes, there are no masses or acute ischemic  changes and there is no bleeding.   ASSESSMENT/PLAN:  1. Slurred speech with difficulty findings words, most probably expressive      aphasia.  2. Uncontrolled hypertension.  3. Hypernatremia.  4. Mild metabolic alkalosis.   For the slurred speech with difficulty finding words, since the patient has  a blood pressure of 232/97, we cannot rule out ischemic change to the speech  area of the brain.  We are going to order an MRI of the brain.  MRA of the  neck and brain and carotid duplex echo.  We will start Aggrenox 1 tablet  p.o. b.i.d.  Neurology was consulted from [facility] neurology.  We are going  to admit the patient for neurological check up every four hours for the time  being.   Uncontrolled hypertension:  Our aim is to keep the blood pressure less than  or equal to 160/90.  We will stop the patient's home medication of  Lisinopril 20 mg p.o. daily.  We will decrease the dose of the metoprolol to  100 mg p.o. b.i.d. because of the heart rate around 67 and we will start  Hydralazine 25 mg p.o. b.i.d. to be titrated accordingly.   For hyponatremia, this could also contribute to the slurred speech.  The  cause is really unclear.  This could be due to stroke or could be due to  dehydration.  We will  get the serum osmolality, urine osmolality and  urine sodium.  We are going to start the patient on normal saline at 60 mL per  hour with a replacement of potassium.   Mild metabolic alkalosis with bicarb in the ER on the first set is 70 and on  the BMP is 27.  The pH is 7.49.  The cause could be due to medications or it  could be due to contracted metabolic alkalosis from dehydration.  We cannot  rule out hyperaldosteronism with a high blood pressure.  We are going to  repeat a BMP in the morning.  If this is still metabolic alkalosis we may  need to do aldosterone level to remain level.  Deep vein thrombosis and GIT  prophylaxis.  We will wait for the neurology consultation.      Hind Bosie Helper, MD  Electronically Signed     HIE/MEDQ  D:  02/04/2006  T:  02/05/2006  Job:  [id number]

## 2010-08-31 NOTE — Procedures (Signed)
[facility]  Patient:    [name], [name]                   MRN: [id number] Proc. Date: 09/29/00 Adm. Date:  [id number] Attending:  Orland Mustard CC:         Clovis Pu Patty Sermons, M.D.   Procedure Report  PROCEDURE:  Esophagogastroduodenoscopy.  ENDOSCOPIST:  Llana Aliment. Edwards, M.D.  MEDICATIONS:  Hurricaine spray, fentanyl 50 mcg, Versed 5 mg IV.  INDICATIONS:  Epigastric pain with a negative gallbladder ultrasound.  INFORMED CONSENT:  The procedure had been explained to the patient and consent obtained.  DESCRIPTION OF PROCEDURE:  With the patient in the left lateral decubitus position, the Olympus adult video endoscope was inserted blindly into the esophagus and advanced under direct visualization.  The stomach was entered, pylorus identified and passed.  The duodenum, including the bulb and second portion were seen well and were unremarkable.  The scope was withdrawn back into the stomach and port channel was normal.  The antrum and body were seen well and were normal.  The fundus and cardia seen in the reflex view were normal. There was no significant hiatal hernia and no esophagitis.  No abnormalities seen in the distal esophagus or at the cardia.  The scope was withdrawn.  The patient tolerated the procedure well.  ASSESSMENT:  Epigastric pain with no obvious cause on endoscopy.  PLAN:  Will continue patient on current medicines.  See back in the office in 6-8 weeks. DD:  09/29/00 TD:  09/30/00 Job: [id number]

## 2010-08-31 NOTE — Procedures (Signed)
[facility]  Patient:    PLESHETTE, TOMASINI                   MRN: [id number] Proc. Date: 06/19/00 Adm. Date:  [id number] Attending:  Orland Mustard CC:         Clovis Pu Patty Sermons, M.D.   Procedure Report  PROCEDURE:  Colonoscopy.  SCOPE:  Olympus pediatric colonoscope.  MEDICATIONS:  Fentanyl 50 mcg, Versed 5 mg IV.  INDICATIONS FOR PROCEDURE:  Previous history of adenomatous colon polyps that were not adenomatous and recent heme positive stool.  DESCRIPTION OF PROCEDURE:  The procedure had been explained to the patient and consent obtained. With the patient in the left lateral decubitus position, the Olympus pediatric video colonoscope was inserted and advanced under direct visualization. The prep was excellent. We were able to advance to the cecum using abdominal pressure and position changes. The ileocecal valve was seen. The scope was withdrawn and the cecum, ascending colon, hepatic flexure, transverse colon, splenic flexure, descending and sigmoid colon were seen well upon removal. No polyps were seen. The patient did have fairly marked diverticulosis in the sigmoid colon but no polyps were seen in this area. The scope was withdrawn. The patient tolerated the procedure well and was maintained on low flow oxygen and pulse oximeter throughout the procedure.  ASSESSMENT: 1. Heme positive stool with nothing really found on colonoscopy to explain it. 2. Diverticulosis.  PLAN:  Will have the follow-up with Dr. Ronny Flurry for future colonoscopy only for positive stools or some other significant finding. DD:  06/19/00 TD:  06/20/00 Job: [id number]

## 2010-08-31 NOTE — Op Note (Signed)
NAME:  [name], [name]                      ACCOUNT NO.:  [id number]   MEDICAL RECORD NO.:  [id number]                   PATIENT TYPE:  AMB   LOCATION:  ENDO                                 FACILITY:  [facility]   PHYSICIAN:  James L. Malon Kindle., M.D.          DATE OF BIRTH:  1919/02/03   DATE OF PROCEDURE:  05/10/2003  DATE OF DISCHARGE:                                 OPERATIVE REPORT   PROCEDURE:  Esophagogastroduodenoscopy.   MEDICATIONS:  Cetacaine spray, fentanyl 25 mcg, Versed 3 mg IV.   INDICATIONS FOR PROCEDURE:  The patient has had a history of chronic  esophageal reflux symptoms. She has been on amoxicillin for a cold, had  odynophagia, I did not see any thrush in her mouth. She was having worsening  reflux and severe odynophagia and for this reason, an endoscopy is  performed.   DESCRIPTION OF PROCEDURE:  The procedure had been explained to the office  and consent obtained. With the patient in the left lateral decubitus  position, the Olympus scope was inserted and advanced.  The esophagus was  entered, the esophagus was completely normal with no evidence of thrush, no  ulceration or inflammation.  The stomach was entered, pylorus identified and  passed. The duodenum including the bulb and second portion was seen well and  was normal.  The scope was withdrawn, no ulcers in the duodenum.  The  pyloric channel was completely normal as was the antrum and body of the  stomach. The fundus and cardia were seen well in the retroflexed view and  appeared to be normal. There was a 2 cm hiatal hernia with a red distal  esophagus with a patent GE junction in fact there might have been a slight  stricture there but freely patent junction with a reddened distal esophagus.  The proximal esophagus was normal, there was no evidence of thrush,  ulcerations, erosions, etc. throughout the entire esophagus.  The scope was  withdrawn, the patient tolerated the procedure well.   ASSESSMENT:   Gastroesophageal reflux disease with no evidence of esophagitis  or thrush, 530.81.   PLAN:  1. Will add Reglan 5 mg a.c. and h.s. and have her continue on the Aciphex.  2. Will give her a reflux sheet.  3. Will see her back in the office in 4-6 weeks.                                               James L. Malon Kindle., M.D.    Waldron Session  D:  05/10/2003  T:  05/10/2003  Job:  [id number]

## 2010-11-15 ENCOUNTER — Other Ambulatory Visit: Payer: Self-pay | Admitting: Internal Medicine

## 2010-11-15 DIAGNOSIS — R51 Headache: Secondary | ICD-10-CM

## 2010-11-16 ENCOUNTER — Ambulatory Visit
Admission: RE | Admit: 2010-11-16 | Discharge: 2010-11-16 | Disposition: A | Discharge status: Home / Self Care | Payer: Medicare Other | Source: Ambulatory Visit | Attending: Internal Medicine | Admitting: Internal Medicine

## 2010-11-16 DIAGNOSIS — R51 Headache: Secondary | ICD-10-CM

## 2010-11-30 ENCOUNTER — Encounter (HOSPITAL_COMMUNITY): Payer: Self-pay | Admitting: Radiology

## 2010-11-30 ENCOUNTER — Emergency Department (HOSPITAL_COMMUNITY): Payer: Medicare Other

## 2010-11-30 ENCOUNTER — Inpatient Hospital Stay (HOSPITAL_COMMUNITY)
Admission: EM | Admit: 2010-11-30 | Discharge: 2010-12-04 | DRG: 378 | Disposition: A | Discharge status: Nursing Home (Includes ICF) | Payer: Medicare Other | Source: Ambulatory Visit | Attending: Emergency Medicine | Admitting: Emergency Medicine

## 2010-11-30 DIAGNOSIS — Y9210 Unspecified residential institution as the place of occurrence of the external cause: Secondary | ICD-10-CM | POA: Diagnosis present

## 2010-11-30 DIAGNOSIS — W010XXA Fall on same level from slipping, tripping and stumbling without subsequent striking against object, initial encounter: Secondary | ICD-10-CM | POA: Diagnosis present

## 2010-11-30 DIAGNOSIS — E785 Hyperlipidemia, unspecified: Secondary | ICD-10-CM | POA: Diagnosis present

## 2010-11-30 DIAGNOSIS — Z79899 Other long term (current) drug therapy: Secondary | ICD-10-CM

## 2010-11-30 DIAGNOSIS — K5731 Diverticulosis of large intestine without perforation or abscess with bleeding: Principal | ICD-10-CM | POA: Diagnosis present

## 2010-11-30 DIAGNOSIS — Z8673 Personal history of transient ischemic attack (TIA), and cerebral infarction without residual deficits: Secondary | ICD-10-CM

## 2010-11-30 DIAGNOSIS — I10 Essential (primary) hypertension: Secondary | ICD-10-CM | POA: Diagnosis present

## 2010-11-30 DIAGNOSIS — S1093XA Contusion of unspecified part of neck, initial encounter: Secondary | ICD-10-CM | POA: Diagnosis present

## 2010-11-30 DIAGNOSIS — D649 Anemia, unspecified: Secondary | ICD-10-CM | POA: Diagnosis present

## 2010-11-30 DIAGNOSIS — Z7902 Long term (current) use of antithrombotics/antiplatelets: Secondary | ICD-10-CM

## 2010-11-30 DIAGNOSIS — K219 Gastro-esophageal reflux disease without esophagitis: Secondary | ICD-10-CM | POA: Diagnosis present

## 2010-11-30 DIAGNOSIS — I6529 Occlusion and stenosis of unspecified carotid artery: Secondary | ICD-10-CM | POA: Diagnosis present

## 2010-11-30 DIAGNOSIS — E876 Hypokalemia: Secondary | ICD-10-CM | POA: Diagnosis present

## 2010-11-30 DIAGNOSIS — S0003XA Contusion of scalp, initial encounter: Secondary | ICD-10-CM | POA: Diagnosis present

## 2010-11-30 DIAGNOSIS — E871 Hypo-osmolality and hyponatremia: Secondary | ICD-10-CM | POA: Diagnosis present

## 2010-11-30 HISTORY — DX: Essential (primary) hypertension: I10

## 2010-11-30 LAB — COMPREHENSIVE METABOLIC PANEL
Albumin: 4 g/dL (ref 3.5–5.2)
Alkaline Phosphatase: 144 U/L — ABNORMAL HIGH (ref 39–117)
BUN: 19 mg/dL (ref 6–23)
Potassium: 3.2 mEq/L — ABNORMAL LOW (ref 3.5–5.1)
Sodium: 127 mEq/L — ABNORMAL LOW (ref 135–145)
Total Protein: 7.1 g/dL (ref 6.0–8.3)

## 2010-11-30 LAB — URINALYSIS, ROUTINE W REFLEX MICROSCOPIC
Glucose, UA: NEGATIVE mg/dL
Leukocytes, UA: NEGATIVE
Nitrite: NEGATIVE
Specific Gravity, Urine: 1.009 (ref 1.005–1.030)
pH: 7.5 (ref 5.0–8.0)

## 2010-11-30 LAB — PROTIME-INR
INR: 0.93 (ref 0.00–1.49)
Prothrombin Time: 12.7 seconds (ref 11.6–15.2)

## 2010-11-30 LAB — CBC
Hemoglobin: 12.7 g/dL (ref 12.0–15.0)
MCHC: 36.2 g/dL — ABNORMAL HIGH (ref 30.0–36.0)
RBC: 3.78 MIL/uL — ABNORMAL LOW (ref 3.87–5.11)

## 2010-11-30 LAB — URINE MICROSCOPIC-ADD ON

## 2010-11-30 LAB — LIPASE, BLOOD: Lipase: 58 U/L (ref 11–59)

## 2010-11-30 LAB — POCT I-STAT, CHEM 8
HCT: 39 % (ref 36.0–46.0)
Hemoglobin: 13.3 g/dL (ref 12.0–15.0)
Potassium: 3.4 mEq/L — ABNORMAL LOW (ref 3.5–5.1)
Sodium: 127 mEq/L — ABNORMAL LOW (ref 135–145)

## 2010-11-30 MED ORDER — IOHEXOL 300 MG/ML  SOLN
80.0000 mL | Freq: Once | INTRAMUSCULAR | Status: AC | PRN
  Administered 2010-11-30: 80 mL via INTRAVENOUS

## 2010-11-30 NOTE — H&P (Signed)
[name], [name] NO.:  [id number]  MEDICAL RECORD NO.:  [id number]  LOCATION:  [facility]                         FACILITY:  [facility]  PHYSICIAN:  [name], M.D.  DATE OF BIRTH:  11/13/1918  DATE OF ADMISSION:  11/30/2010 DATE OF DISCHARGE:                             HISTORY & PHYSICAL   PRIMARY CARE PHYSICIAN:  Anne Curt. [name], MD  PRIMARY GASTROENTEROLOGIST:  [name], GI.  CHIEF COMPLAINT:  Hematochezia and fall in the a.m. of November 30, 2010.  HISTORY OF PRESENT ILLNESS:  This is an 77 year old female.  The patient is a poor historian, appears quite confused and is unable to provide history.  History is therefore obtained from ED MD.  The patient apparently, went to the bathroom this a.m. at the facility where she resides and while there, she passed blood in the stool.  It is difficult to quantify the amount from the patient, but according to her, she did not have diarrhea and as a matter of fact, she has been constipated lately. Denies any history of previous similar episodes.  Denies abdominal pain. On getting up from the toilet, the patient slipped on the bathroom floor, suffered a fall and bumped her face.  She was brought to the emergency department and was initially seen by the trauma team.  Acute bony injuries were ruled out.  She had no internal injuries; although, she did sustain a hematoma to the right side of her face.  She was then referred to the medical service.  PAST MEDICAL HISTORY: 1. Cerebrovascular disease, status post lacunar CVAs and TIAs, also     status post right parietal CVA, April 2012. 2. Hypertension. 3. SIADH. 4. Dyslipidemia. 5. History of tachyarrhythmia. 6. History of ischemic colitis on July 2009. 7. GERD. 8. Diverticulosis. 9. Status post total abdominal hysterectomy/bilateral salpingo-     oophorectomy. 10.Status post breast lump biopsy with benign findings. 11.Status post cataract surgery. 12.Status post  tonsillectomy and adenoidectomy. 13.A 40% to 50% right ICA stenosis on carotid duplex, April 2012.  ALLERGIES:  CODEINE.  MEDICATION:  This is not complete at the present time and the patient is unable to provide information; however, it appears that she is on, 1. Aciphex. 2. Digoxin. 3. Hydralazine. 4. Lisinopril. 5. Lorazepam. 6. Metoprolol tartrate. 7. Paroxetine. 8. Plavix, doses unknown.  REVIEW OF SYSTEMS:  Unobtainable.  However, the patient does not appear to have chest pain, shortness of breath, or fever, neither does she have a cough.  As far as we can determine, she had no vomiting or diarrhea.  SOCIAL HISTORY:  The patient is a resident of [facility].  She is a former [employer] fundraiser.  She is widowed, has 1 daughter. Nonsmoker and nondrinker, has no history of drug abuse.  FAMILY HISTORY:  As far as we can determine, the patient's mother died of unknown causes.  Her father died in his 30s of "poisoning."  PHYSICAL EXAMINATION:  VITAL SIGNS:  Temperature 97.8, pulse 61 per minute regular, respiratory rate 21, BP 175/81 mmHg, and pulse oximeter 98% to 100% on room air. GENERAL:  The patient did not appear to be in obvious acute discomfort at the time of this  evaluation.  Alert, communicative, and somewhat confused, not short of breath at rest. HEENT:  The patient has bruising and hematoma right side of the face, also extending down to the right sternomastoid muscle.  No clinical pallor or jaundice.  No conjunctival injection.  Throat is clear. Visible mucous membranes appear "dry." NECK:  Supple.  JVP not seen.  The patient has bruising along the course of the right sternomastoid muscle with a palpable hematoma in the upper one-third. CHEST:  Clinically, clear to auscultation.  No wheezes or crackles. HEART:  S1 and S2 heard normal, regular.  No murmurs. ABDOMEN:  Soft.  The patient appears to have diffuse discomfort.  No palpable organomegaly.  No  palpable masses.  Bowel sounds appear normal. EXTREMITIES:  Lower extremity exam showed no pitting edema.  Palpable peripheral pulses. MUSCULOSKELETAL:  Generalized osteoarthritic changes. CENTRAL NERVOUS SYSTEM:  No focal neurologic deficits on gross examination.  LABORATORY DATA:  Investigations, CBC, WBC 9.5, hemoglobin 13.3, hematocrit 39.0, and platelets 318.  Electrolytes, sodium 127, potassium 3.4, chloride 88, CO2 of 30, BUN 20, creatinine 1.0, and glucose 117. Alkaline phosphatase 144, AST 22, and ALT 17.  Albumin 4.0.  Calcium 9.6, ionized calcium 1.09, and INR 0.93.  Urinalysis shows, wbc 0 to 2, rbc 0 to 2, and bacteria rare.  Fecal occult blood testing is positive. Abdominal/pelvic CT scan November 30, 2010, showed multiple liver cysts. No acute or traumatic findings.  There was advanced atherosclerosis of the aorta and also diverticulosis.  Chest x-ray on November 30, 2010, shows patchy right upper lobe airspace disease.  X-ray of the pelvis November 30, 2010, shows osteoarthritis of both hips, but no fracture. Maxillofacial CT scan November 30, 2010, showed large hematoma overlying the right side of the face.  There was also mild chronic sinusitis, but no fractures.  Head CT scan on November 30, 2010, shows no acute intracranial findings.  There were chronic ischemic changes in the white matter.  ASSESSMENT AND PLAN: 1. Right upper lobe pneumonia.  This must be considered healthcare     associated.  We shall therefore place the patient on broad-spectrum     antibiotic coverage with the combination of Vancomycin and Zosyn.  Because of its     location, we will also have to wonder about aspiration.  The     patient will benefit from a swallow evaluation.  2. Mechanical fall.  The patient fortunately, sustained no bony     injuries.  She does have soft tissue facial, and right sternomastoid     trauma, with hematoma.  We shall observe for now, and place on     analgesics.  3.  Hyponatremia.  The patient has a known history of mild SIADH;     however, clinically she appears "dry" at this time.  We shall     therefore manage with intravenous infusion of normal saline and     follow electrolytes.  4. Hematochezia.  The patient has not had a recent colonoscopy.     Etiology is unclear.  She is hemodynamically stable and CBC is     normal.  We shall place on clear fluids for now and consult [facility].  She has been seen in the past by Dr. Carman Ching, Dr. Vida Rigger, and Dr. Dorena Cookey.  5. Hypertension.  BP is uncontrolled, partially secondary to pain.  We     will recommence the patient on preadmission antihypertensives, when  doses are known.  For now, we shall place her on lisinopril in an     initial dose of 40 mg p.o. daily, and utilize p.r.n hydralazine.  6. History of cerebrovascular disease.  Plavix will be held     temporarily, in view of hematoma and lower GI bleed.    Further management will depend on clinical course.      [name], M.D.     CO/MEDQ  D:  11/30/2010  T:  11/30/2010  Job:  [id number]  cc:   [name], M.D. [facility] Llana Aliment. Malon Kindle., M.D.  Electronically Signed by [name] M.D. on 11/30/2010 08:17:09 PM

## 2010-12-01 LAB — BASIC METABOLIC PANEL
BUN: 15 mg/dL (ref 6–23)
Chloride: 92 mEq/L — ABNORMAL LOW (ref 96–112)
Creatinine, Ser: 0.74 mg/dL (ref 0.50–1.10)
GFR calc Af Amer: >60 mL/min (ref >60)
GFR calc non Af Amer: >60 mL/min (ref >60)
Glucose, Bld: 106 mg/dL — ABNORMAL HIGH (ref 70–99)

## 2010-12-01 LAB — CBC
HCT: 31 % — ABNORMAL LOW (ref 36.0–46.0)
Hemoglobin: 11.1 g/dL — ABNORMAL LOW (ref 12.0–15.0)
MCHC: 35.8 g/dL (ref 30.0–36.0)
MCV: 92.5 fL (ref 78.0–100.0)
RDW: 13.3 % (ref 11.5–15.5)

## 2010-12-02 ENCOUNTER — Inpatient Hospital Stay (HOSPITAL_COMMUNITY): Payer: Medicare Other

## 2010-12-02 LAB — CBC
HCT: 30.7 % — ABNORMAL LOW (ref 36.0–46.0)
MCH: 33.6 pg (ref 26.0–34.0)
MCHC: 35.8 g/dL (ref 30.0–36.0)
RDW: 13.3 % (ref 11.5–15.5)

## 2010-12-02 LAB — BASIC METABOLIC PANEL
CO2: 27 mEq/L (ref 19–32)
Chloride: 94 mEq/L — ABNORMAL LOW (ref 96–112)
Creatinine, Ser: 0.55 mg/dL (ref 0.50–1.10)
Potassium: 2.9 mEq/L — ABNORMAL LOW (ref 3.5–5.1)
Sodium: 130 mEq/L — ABNORMAL LOW (ref 135–145)

## 2010-12-02 LAB — POTASSIUM
Potassium: 3.2 mEq/L — ABNORMAL LOW (ref 3.5–5.1)
Potassium: 3.9 mEq/L (ref 3.5–5.1)

## 2010-12-02 LAB — OSMOLALITY: Osmolality: 261 mOsm/kg — ABNORMAL LOW (ref 275–300)

## 2010-12-02 LAB — OSMOLALITY, URINE: Osmolality, Ur: 405 mOsm/kg (ref 390–1090)

## 2010-12-03 ENCOUNTER — Inpatient Hospital Stay (HOSPITAL_COMMUNITY): Payer: Medicare Other

## 2010-12-03 LAB — BASIC METABOLIC PANEL
Chloride: 92 mEq/L — ABNORMAL LOW (ref 96–112)
GFR calc Af Amer: >60 mL/min (ref >60)
GFR calc non Af Amer: >60 mL/min (ref >60)
Potassium: 3.6 mEq/L (ref 3.5–5.1)
Sodium: 127 mEq/L — ABNORMAL LOW (ref 135–145)

## 2010-12-03 LAB — CBC
MCHC: 35.2 g/dL (ref 30.0–36.0)
Platelets: 244 10*3/uL (ref 150–400)
RDW: 12.9 % (ref 11.5–15.5)
WBC: 9 10*3/uL (ref 4.0–10.5)

## 2010-12-03 MED ORDER — TECHNETIUM TC 99M SULFUR COLLOID
2.0000 | Freq: Once | INTRAVENOUS | Status: AC | PRN
  Administered 2010-12-03: 2 via INTRAVENOUS

## 2010-12-04 LAB — BASIC METABOLIC PANEL
CO2: 26 mEq/L (ref 19–32)
Chloride: 90 mEq/L — ABNORMAL LOW (ref 96–112)
GFR calc Af Amer: >60 mL/min (ref >60)
Sodium: 127 mEq/L — ABNORMAL LOW (ref 135–145)

## 2010-12-04 LAB — TYPE AND SCREEN
ABO/RH(D): O POS
Antibody Screen: NEGATIVE
Unit division: 0

## 2010-12-04 NOTE — Discharge Summary (Signed)
[name], SEDLAK NO.:  [id number]  MEDICAL RECORD NO.:  [id number]  LOCATION:  3703                         FACILITY:  [facility]  PHYSICIAN:  [name], MD  DATE OF BIRTH:  [birth year]  DATE OF ADMISSION:  11/30/2010 DATE OF DISCHARGE:  12/04/2010                        DISCHARGE SUMMARY - REFERRING   PRIMARY CARE PHYSICIAN:  [name], M.D.  CONSULTANTS INVOLVED IN THE CASE: 1. GI doctor, [name], M.D. 2. [facility] physician involved in the case are Anne Holts, MD and Anne Ranger, MD and Dr. [name] (the patient     seen by me for the very first time in this admission on December 04, 2010.)  DISCHARGE DIAGNOSES: 1. Fall - no fractures seen or any intracranial hemorrhage. 2. Painless hematochezia most likely secondary to diverticular disease     (diverticulosis.) 3. Questionable right upper lobe pneumonia, this is very very     doubtful.  The patient did not complain of chest pain or shortness     of breath, no cough, no systemic symptoms.  Hematological indices     was essentially unremarkable.  Antibiotics discontinued.  One     cannot treat pneumonia just by using questionable imaging study     finding. 4. Anemia.  H and H stable. 5. Hypertension. 6. Hypokalemia. 7. Hyponatremia, acute versus chronic.  Questionable secondary to     syndrome of inappropriate ADH secretion. 8. History of dyslipidemia. 9. History of tachyarrhythmia. 10.Gastroesophageal reflux disease. 11.History of ischemic colitis.  HISTORY OF PRESENT ILLNESS:  The patient is an 77 year old Caucasian female who was admitted to the hospital on November 30, 2010 by the Dr. Isidor Bridges, with history of accidental fall at the nursing facility.  There was no premonitory symptoms prior to the onset of fall. The patient denied any chest pain, denied any shortness of breath, denied any history of blurred vision.  At the same time, the  patient was said to have complained about seeing blood in stool.  There was no associated abdominal pain.  There was no pain during defecation.  There is no history of diarrhea.  There is no history of nausea or vomiting. No fever.  No chills.  No rigor.  No dysuria or hematuria.  However, fecal occult blood test done was said to be positive.  Because of fall, the patient was admitted to rule out any fractures or intracranial hemorrhage.  MEDICATIONS:  Her preadmission med without dosages include 1. AcipHex. 2. Digoxin. 3. Hydralazine. 4. Lisinopril. 5. Lorazepam. 6. Metoprolol tartrate. 7. Paroxetine. 8. Plavix.  ALLERGIES:  CODEINE.  PAST SURGICAL HISTORY: 1. Total abdominal hysterectomy with bilateral salpingo-oophorectomy,     indication unknown. 2. Breast lumpectomy. 3. Cataract surgery. 4. Tonsillectomy. 5. Adenoidectomy.  SOCIAL HISTORY:  Negative for alcohol or tobacco use.  She is a resident of the assisted living facility.  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  FAMILY HISTORY AND REVIEW OF SYSTEMS:  Essentially documented in the initial history and physical.  PHYSICAL EXAMINATION:  At time the patient was seen by the admitting physician, VITAL SIGNS:  Temperature 97.8, pulse 61, blood pressure 175/81,  pulse ox in room air was between 98% to 100%. HEENT:  The patient has periorbital ecchymosis as well as bruising around the neck.  No subconjunctival injection seen. NECK:  No jugular venous distention.  No carotid bruit.  No lymphadenopathy, but have some bruising around the neck. CHEST:  Clear to auscultation. HEART:  Sound 1 and 2. ABDOMEN:  Soft with diffuse tenderness.  Liver, spleen, and kidney not palpable.  Bowel sounds are positive. EXTREMITIES:  No pedal edema. NEUROLOGIC:  Nonfocal. MUSCULOSKELETAL:  Arthritic changes in the knees and feet. NEUROPSYCHIATRIC:  Evaluation was unremarkable.  LAB DATA:  Chem-8 data shows sodium of 127, potassium of  3.4, chloride of 88, glucose is 417, BUN is 20, creatinine is 1.00.  Lactic acid level is 2.1.  Comprehensive metabolic panel showed sodium of 127, potassium of 3.2, chloride of 86, bicarb 30, glucose is 415, BUN is 19, creatinine 0.79.  LFTs showed total bilirubin 0.4, alkaline phosphatase is 444, AST 22, ALT 17, lipase 58.  Urinalysis unremarkable.  Urine microscopy unremarkable.  Fecal occult blood test said to be positive.  A repeat complete blood count with no differential done on December 01, 2010 showed WBC of 13.2, hemoglobin 11.1, hematocrit 31.0, MCV of 92.5 with a platelet count of 260.  Repeat complete blood count with no differential done December 01, 2010 showed WBC of 9.2, hemoglobin 11.0, hematocrit 30.7, MCV 93.9 with a platelet count of 239.  Basic metabolic panel showed sodium of 130, potassium of 2.9, chloride of 94 with a bicarb of 27, glucose is 97, BUN is 8, creatinine 0.55.  Repeat complete blood count with no differential done on December 03, 2010 showed WBC of 9.0, hemoglobin 10.7, hematocrit 30.4, MCV of 95.0 with a platelet count of 244.  Basic metabolic panel done on December 03, 2010 showed sodium of 127, potassium of 3.6, chloride of 92, bicarb is 27, glucose is 79, BUN is 10, creatinine 2.54.  Basic metabolic panel done on December 04, 2010 showed sodium of 127, potassium of 3.2, chloride of 19, bicarb is 26, glucose is 88, BUN is 11, creatinine 0.53.  IMAGING STUDIES:  Chest x-ray showed patchy right upper lobe airspace disease, may represent pneumonia or contusion, neoplasm is less likely. X-ray of the pelvis showed osteoarthritis.  No fractures seen.  X-ray of the maxillofacial showed chronic ischemic changes in the white matter, no abnormalities seen.  CT of the head chronic ischemic changes, no acute abnormalities seen.  CT of the abdomen and pelvis with contrast showed diverticulosis.  Repeat chest x-ray done on August 19, showed a persistent right upper lobe  airspace opacification which appears unchanged from prior study, borderline cardiomegaly (very doubtful).  My assessment is minimal left bibasilar atelectasis, diffuse calcification along the abdominal aorta and multiple compression fracture at the mid thoracic spine.  Gastric emptying study normal.  CT of the chest with contrast showed patchy opacification in the right upper lobe suggestive of endobronchial spread of infection such as pneumonia, although TB could have a similar appearance.  HOSPITAL COURSE:  The patient was admitted to tele bed, started on normal saline with 40 mEq of KCl to go at rate of 75 mL an hour.  The patient was placed on SCDs boots for DVT prophylaxis and Zofran for nausea and pain control was done with Tylenol as well as morphine.  The patient was started on IV vancomycin as well as Zosyn, dosing done by pharmacy for questionable right upper lobe pneumonia and blood  pressure control was done with hydralazine.  Since the patient was found to be hypokalemic, he was also given KCl 50 mEq x1 dose of subsequent KCl p.o. IV x3 doses.  Also added to the patient's regimen was Avelox 100 mg p.o. daily.  Blood pressure control was done with hydralazine 75 mg p.o. t.i.d.  The patient was however seen by me for the very first time in this admission today which is December 04, 2010.  She denied any chest pain.  She denied any shortness of breath, denied any cough. Examination of the patient clinically showed clear lung with no adventitious sounds.  Vital Signs:  Blood pressure is 160/72, pulse 68, respiratory rate is 18, temperature is 97.9, medically stable.  Plan is for the patient to be discharged back to assisted living facility.  No indications for antibiotics.  The patient will be given KCl 20 mEq p.o. x1 dose subsequently to continue with KCl p.o. at the assisted living facility.  DISCHARGE MEDICATIONS:  Medication to be taken at the facility will include the  following, 1. Demeclocycline 150 mg p.o. b.i.d. 2. Potassium chloride 20 mEq p.o. b.i.d. 3. Tramadol 25 mg one p.o. t.i.d. 4. Clonidine 0.1 mg p.o. t.i.d. 5. Omeprazole 20mg  p.o daily 6. AcipHex 20 mg one p.o. daily. 7. Enteric-coated aspirin 325 mg p.o. daily. 8. Digoxin 0.125 mg p.o. daily. 9. Hydralazine 50 mg one p.o. daily. 10.Lisinopril 40 mg one p.o. daily. 11.Lorazepam 0.5 mg one p.o. q.12 p.r.n. for anxiety. 12.Metoprolol 100 mg one p.o. t.i.d. 13.Vitamin B12.1 tab p.o daily 14.Cyanocobalamin over-the-counter one p.o. daily. 15.Vitamin D over-the-counter one p.o. b.i.d.     [name], MD    CN/MEDQ  D:  12/04/2010  T:  12/04/2010  Job:  [id number]  cc:   [name], M.D.  Electronically Signed by [name]  on 12/04/2010 08:07:40 PM

## 2011-01-06 NOTE — Consult Note (Signed)
  [name], [name]            ACCOUNT NO.:  [id number]  MEDICAL RECORD NO.:  [id number]  LOCATION:                                 FACILITY:  PHYSICIAN:  Gideon Burstein C. Madilyn Fireman, M.D.    DATE OF BIRTH:  [birth year]  DATE OF CONSULTATION:  12/01/2010 DATE OF DISCHARGE:                                CONSULTATION   REASON FOR CONSULTATION:  GI bleeding.  HISTORY OF PRESENT ILLNESS:  The patient is an 77 year old white female who apparently fell in the bathroom and was observed passed blood in her stool, although documentation of this is not clear.  She also fell and bruised her face.  She has a mild hematoma on the right side of her face.  Initial hemoglobin was 13.1.  Stool was positive for blood, but I have not seen any mention of any gross hematochezia since arriving here. Hemoglobin was 13.3 yesterday and 11.1 today.  She denies any abdominal pain and denies having any had any bowel movement overnight.  Abdominal CT scan showed diverticulosis, I cannot absolutely rule out diverticulitis.  The patient has known left-sided diverticulosis by colonoscopy in 2005 and had a bout of apparent ischemic colitis by flexible sigmoidoscopy in 2009.  PAST MEDICAL HISTORY:  History of multiple CVAs, hypertension, syndrome of inappropriate antidiuretic hormone, dyslipidemia, tachy arrhythmia, history of ischemic colitis.  SURGERIES:  Hysterectomy, BSO, tonsillectomy, adenoidectomy, cataract surgery.  MEDICATIONS:  As known AcipHex, digoxin, hydralazine, lisinopril, lorazepam, metoprolol, Paxil, Plavix.  PHYSICAL EXAM:  GENERAL:  Elderly white female with ecchymosis under her right eye in no acute distress. HEART:  Regular rate and rhythm. ABDOMEN:  Soft, nondistended with normoactive bowel sounds.  No hepatosplenomegaly, mass, or guarding.  IMPRESSION:  Apparent gastrointestinal bleeding, suspect diverticular hemorrhage, hemodynamically stable.  PLAN:  We will monitor stools and  hemoglobin to determine degree of GI bleeding, pursue endoscopic evaluation only if hemodynamically significant or persistent bleeding.  We will follow with you.          ______________________________ Everardo All. Madilyn Fireman, M.D.     [facility]/MEDQ  D:  12/01/2010  T:  12/01/2010  Job:  [id number]  Electronically Signed by Dorena Cookey M.D. on 01/06/2011 11:39:46 AM

## 2011-01-08 LAB — CBC
HCT: 37
Hemoglobin: 13.1
MCHC: 35.4
Platelets: 284
RDW: 12.9

## 2011-01-08 LAB — COMPREHENSIVE METABOLIC PANEL
Albumin: 3.5
Alkaline Phosphatase: 102
BUN: 11
Calcium: 9
Glucose, Bld: 94
Potassium: 3.6
Total Protein: 6.5

## 2011-01-08 LAB — URINALYSIS, ROUTINE W REFLEX MICROSCOPIC
Glucose, UA: NEGATIVE
Ketones, ur: NEGATIVE
Nitrite: NEGATIVE
Protein, ur: NEGATIVE
pH: 7.5

## 2011-01-08 LAB — DIFFERENTIAL
Basophils Relative: 0
Lymphocytes Relative: 25
Lymphs Abs: 1.6
Monocytes Absolute: 0.7
Monocytes Relative: 11
Neutro Abs: 4
Neutrophils Relative %: 64

## 2011-01-08 LAB — POCT CARDIAC MARKERS
CKMB, poc: <1 — ABNORMAL LOW
Operator id: 3206
Troponin i, poc: <0.05

## 2011-01-10 LAB — CBC
HCT: 27.9 — ABNORMAL LOW
HCT: 32.6 — ABNORMAL LOW
HCT: 33 — ABNORMAL LOW
HCT: 33.3 — ABNORMAL LOW
HCT: 38.9
HCT: 40.5
Hemoglobin: 11.3 — ABNORMAL LOW
Hemoglobin: 13.2
Hemoglobin: 13.9
MCHC: 33.9
MCHC: 34
MCHC: 34
MCV: 94.6
MCV: 96.3
MCV: 96.6
MCV: 97.3
MCV: 97.5
MCV: 97.7
MCV: 98
Platelets: 194
Platelets: 217
Platelets: 219
Platelets: 291
Platelets: 298
Platelets: 313
Platelets: ADEQUATE
RBC: 2.85 — ABNORMAL LOW
RBC: 3.09 — ABNORMAL LOW
RBC: 3.35 — ABNORMAL LOW
RBC: 4.19
RDW: 13.5
RDW: 14.2
RDW: 14.4
WBC: 10.8 — ABNORMAL HIGH
WBC: 12.8 — ABNORMAL HIGH
WBC: 4.9
WBC: 8.8
WBC: 9.6

## 2011-01-10 LAB — COMPREHENSIVE METABOLIC PANEL
ALT: 16
ALT: 18
AST: 17
AST: 24
Albumin: 2.4 — ABNORMAL LOW
Albumin: 2.5 — ABNORMAL LOW
Albumin: 3 — ABNORMAL LOW
Albumin: 3.6
Alkaline Phosphatase: 106
Alkaline Phosphatase: 63
BUN: 18
BUN: 18
BUN: 21
CO2: 25
Calcium: 8.3 — ABNORMAL LOW
Chloride: 101
Chloride: 94 — ABNORMAL LOW
Creatinine, Ser: 0.66
Creatinine, Ser: 0.69
Creatinine, Ser: 0.85
GFR calc Af Amer: >60
GFR calc non Af Amer: >60
GFR calc non Af Amer: >60
Glucose, Bld: 129 — ABNORMAL HIGH
Potassium: 3.5
Potassium: 4.1
Sodium: 128 — ABNORMAL LOW
Sodium: 128 — ABNORMAL LOW
Total Bilirubin: 0.7
Total Bilirubin: 0.8
Total Bilirubin: 0.9
Total Protein: 5.1 — ABNORMAL LOW
Total Protein: 5.2 — ABNORMAL LOW
Total Protein: 6.1

## 2011-01-10 LAB — BASIC METABOLIC PANEL
BUN: 2 — ABNORMAL LOW
BUN: 3 — ABNORMAL LOW
BUN: 4 — ABNORMAL LOW
BUN: 6
BUN: 9
CO2: 23
Calcium: 8.1 — ABNORMAL LOW
Calcium: 8.2 — ABNORMAL LOW
Chloride: 100
Chloride: 93 — ABNORMAL LOW
Chloride: 95 — ABNORMAL LOW
Chloride: 97
Chloride: 98
Creatinine, Ser: 0.47
Creatinine, Ser: 0.48
Creatinine, Ser: 0.54
GFR calc Af Amer: >60
GFR calc Af Amer: >60
GFR calc Af Amer: >60
GFR calc Af Amer: >60
GFR calc non Af Amer: >60
GFR calc non Af Amer: >60
GFR calc non Af Amer: >60
Glucose, Bld: 99
Potassium: 3.2 — ABNORMAL LOW
Potassium: 3.2 — ABNORMAL LOW
Potassium: 3.5
Potassium: 4.2

## 2011-01-10 LAB — OCCULT BLOOD X 1 CARD TO LAB, STOOL: Fecal Occult Bld: POSITIVE

## 2011-01-10 LAB — SODIUM, URINE, RANDOM: Sodium, Ur: 26

## 2011-01-10 LAB — SAMPLE TO BLOOD BANK

## 2011-01-10 LAB — OSMOLALITY, URINE: Osmolality, Ur: 679

## 2011-01-10 LAB — DIFFERENTIAL
Basophils Absolute: 0.1
Basophils Relative: 1
Eosinophils Relative: 0
Monocytes Absolute: 0.8
Neutro Abs: 9 — ABNORMAL HIGH

## 2011-01-10 LAB — OSMOLALITY: Osmolality: 273 — ABNORMAL LOW

## 2011-01-10 LAB — DIGOXIN LEVEL: Digoxin Level: 0.2 — ABNORMAL LOW

## 2011-01-10 LAB — MAGNESIUM: Magnesium: 1.8

## 2011-01-10 LAB — CLOSTRIDIUM DIFFICILE EIA: C difficile Toxins A+B, EIA: NEGATIVE

## 2011-01-10 LAB — PROTIME-INR: INR: 1

## 2011-01-31 ENCOUNTER — Inpatient Hospital Stay (HOSPITAL_COMMUNITY): Payer: Medicare Other

## 2011-01-31 ENCOUNTER — Inpatient Hospital Stay (HOSPITAL_COMMUNITY)
Admission: EM | Admit: 2011-01-31 | Discharge: 2011-02-02 | DRG: 378 | Disposition: A | Discharge status: Nursing Home (Includes ICF) | Payer: Medicare Other | Attending: Internal Medicine | Admitting: Internal Medicine

## 2011-01-31 DIAGNOSIS — E871 Hypo-osmolality and hyponatremia: Secondary | ICD-10-CM | POA: Diagnosis present

## 2011-01-31 DIAGNOSIS — D62 Acute posthemorrhagic anemia: Secondary | ICD-10-CM | POA: Diagnosis present

## 2011-01-31 DIAGNOSIS — I10 Essential (primary) hypertension: Secondary | ICD-10-CM | POA: Diagnosis present

## 2011-01-31 DIAGNOSIS — E785 Hyperlipidemia, unspecified: Secondary | ICD-10-CM | POA: Diagnosis present

## 2011-01-31 DIAGNOSIS — D72829 Elevated white blood cell count, unspecified: Secondary | ICD-10-CM | POA: Diagnosis present

## 2011-01-31 DIAGNOSIS — K5731 Diverticulosis of large intestine without perforation or abscess with bleeding: Principal | ICD-10-CM | POA: Diagnosis present

## 2011-01-31 LAB — COMPREHENSIVE METABOLIC PANEL
AST: 26 U/L (ref 0–37)
Albumin: 3.8 g/dL (ref 3.5–5.2)
Alkaline Phosphatase: 105 U/L (ref 39–117)
BUN: 20 mg/dL (ref 6–23)
Creatinine, Ser: 0.94 mg/dL (ref 0.50–1.10)
Potassium: 4 mEq/L (ref 3.5–5.1)
Total Protein: 7.8 g/dL (ref 6.0–8.3)

## 2011-01-31 LAB — CBC
Hemoglobin: 12.8 g/dL (ref 12.0–15.0)
MCH: 32.1 pg (ref 26.0–34.0)
Platelets: 297 10*3/uL (ref 150–400)
RBC: 3.99 MIL/uL (ref 3.87–5.11)

## 2011-01-31 LAB — URINE MICROSCOPIC-ADD ON

## 2011-01-31 LAB — URINALYSIS, ROUTINE W REFLEX MICROSCOPIC
Nitrite: NEGATIVE
Specific Gravity, Urine: 1.012 (ref 1.005–1.030)
Urobilinogen, UA: 0.2 mg/dL (ref 0.0–1.0)
pH: 6 (ref 5.0–8.0)

## 2011-01-31 LAB — DIFFERENTIAL
Basophils Absolute: 0 10*3/uL (ref 0.0–0.1)
Basophils Relative: 0 % (ref 0–1)
Monocytes Relative: 10 % (ref 3–12)
Neutro Abs: 7.6 10*3/uL (ref 1.7–7.7)
Neutrophils Relative %: 59 % (ref 43–77)

## 2011-01-31 LAB — LIPASE, BLOOD: Lipase: 16 U/L (ref 11–59)

## 2011-01-31 LAB — LACTIC ACID, PLASMA: Lactic Acid, Venous: 1 mmol/L (ref 0.5–2.2)

## 2011-01-31 LAB — PROTIME-INR
INR: 0.95 (ref 0.00–1.49)
Prothrombin Time: 12.9 seconds (ref 11.6–15.2)

## 2011-01-31 LAB — HEMOGLOBIN AND HEMATOCRIT, BLOOD: HCT: 29.6 % — ABNORMAL LOW (ref 36.0–46.0)

## 2011-01-31 MED ORDER — IOHEXOL 300 MG/ML  SOLN
80.0000 mL | Freq: Once | INTRAMUSCULAR | Status: AC | PRN
  Administered 2011-01-31: 80 mL via INTRAVENOUS

## 2011-02-01 LAB — COMPREHENSIVE METABOLIC PANEL
ALT: 10 U/L (ref 0–35)
AST: 15 U/L (ref 0–37)
CO2: 25 mEq/L (ref 19–32)
Calcium: 7.8 mg/dL — ABNORMAL LOW (ref 8.4–10.5)
Chloride: 103 mEq/L (ref 96–112)
GFR calc non Af Amer: 75 mL/min — ABNORMAL LOW (ref >90)
Sodium: 136 mEq/L (ref 135–145)

## 2011-02-01 LAB — DIFFERENTIAL
Lymphocytes Relative: 30 % (ref 12–46)
Lymphs Abs: 2.2 10*3/uL (ref 0.7–4.0)
Neutro Abs: 4.1 10*3/uL (ref 1.7–7.7)
Neutrophils Relative %: 56 % (ref 43–77)

## 2011-02-01 LAB — CBC
HCT: 26.8 % — ABNORMAL LOW (ref 36.0–46.0)
Hemoglobin: 9.3 g/dL — ABNORMAL LOW (ref 12.0–15.0)
MCV: 93.7 fL (ref 78.0–100.0)
Platelets: 197 10*3/uL (ref 150–400)
RBC: 2.86 MIL/uL — ABNORMAL LOW (ref 3.87–5.11)
WBC: 7.4 10*3/uL (ref 4.0–10.5)

## 2011-02-01 LAB — MRSA PCR SCREENING: MRSA by PCR: NEGATIVE

## 2011-02-01 LAB — APTT: aPTT: 30 seconds (ref 24–37)

## 2011-02-01 LAB — HEMOGLOBIN AND HEMATOCRIT, BLOOD: Hemoglobin: 9.5 g/dL — ABNORMAL LOW (ref 12.0–15.0)

## 2011-02-02 LAB — CBC
MCV: 95.3 fL (ref 78.0–100.0)
Platelets: 263 10*3/uL (ref 150–400)
RBC: 3.37 MIL/uL — ABNORMAL LOW (ref 3.87–5.11)
WBC: 6.9 10*3/uL (ref 4.0–10.5)

## 2011-02-05 NOTE — H&P (Signed)
NAMEPORSCHA, AXLEY            ACCOUNT NO.:  [id number]  MEDICAL RECORD NO.:  [id number]  LOCATION:  1438                         FACILITY:  [facility]  PHYSICIAN:  Kathlen Mody, MD       DATE OF BIRTH:  03/28/1919  DATE OF ADMISSION:  01/31/2011 DATE OF DISCHARGE:                             HISTORY & PHYSICAL   PRIMARY CARE PHYSICIAN:  [name]. [name], M.D.  GASTROENTEROLOGY:  Bernette Redbird, M.D. from [facility].  CHIEF COMPLAINT:  Patient sent from [facility] for multiple episodes of melena and mild abdominal pain.  HISTORY OF PRESENT ILLNESS:  77 year old pleasant lady with a history of questionable diverticulosis, hypertension, hyperlipidemia, GERD, was sent from [facility] for multiple episodes of melena since yesterday and mild crampy lower abdominal pain.  As per the patient, she had 2 dark bowel movements yesterday associated with mild lower quadrant abdominal pain associated with nausea and 1 episode of vomiting yesterday afternoon.  Denies any fever, chills, cough, or shortness of breath or chest pain.  This morning, she had another episode of melena, not associated with any nausea or vomiting.  In ER, she was given pain medication and her abdominal pain has completely resolved.  The patient denies any other complaints.  Denies urinary complaints.  Denies any headache or blurry vision.  She was admitted in August 2012 for a fall and hematochezia, presumed GI bleed, presumed diverticulosis but she never had any endoscopy done at that time.  Dr. Matthias Hughs was called today for further evaluation and management of her GI bleed.  REVIEW OF SYSTEMS:  See HPI, otherwise negative.  PAST MEDICAL HISTORY: 1. GERD. 2. Hypertension. 3. Hyperlipidemia. 4. History of ischemic colitis.  PAST SURGICAL HISTORY: 1. Abdominal hysterectomy. 2. Lumpectomy. 3. Cataract surgery. 4. Tonsillectomy and adenoidectomy.  SOCIAL  HISTORY:  Lives at [facility].  Denies smoking, EtOH, or any recreational drug use.  HOME MEDICATIONS: 1. AcipHex. 2. Aspirin 325 mg. 3. Digoxin 0.125 mg every other day. 4. Lisinopril 40 mg daily. 5. Vitamin B12 1 tab daily. 6. Demeclocycline 150 mg 1 tab twice a day. 7. Lorazepam 0.5 mg twice a day. 8. Vitamin D3 one tab daily. 9. Clonidine 0.1 mg 1 tab t.i.d. 10.Mirtazapine 15 mg tab, 1 tab at bedtime. 11.Metoprolol 100 mg twice a day. 12.Hydralazine 50 mg b.i.d. 13.Mylanta q.4 hours p.r.n.  PHYSICAL EXAMINATION:  VITAL SIGNS:  She is afebrile.  Blood pressure 150/60, pulse of 69, respiratory rate 22, saturating 94% on room air. GENERAL:  She is alert, afebrile, comfortable, in no acute distress. HEENT EXAM:  Pupils reacting to light and accommodation.  Atraumatic and normocephalic.  No JVD.  No scleral icterus.  Dry mucous membranes. CARDIOVASCULAR:  S1, S2 heard.  No murmurs, rubs, or gallops. RESPIRATORY EXAM:  Chest clear to auscultation bilaterally.  No wheezing or rhonchi. ABDOMEN:  Soft, nontender, nondistended.  Bowel sounds are heard. EXTREMITIES:  No pedal edema, cyanosis, or clubbing. NEUROLOGICAL:  Alert and oriented x3.  No motor or sensory deficits.   EKG normal sinus rhythm at 65 with occasional PVCs.  PERTINENT LABORATORIES:  CBC showed a WBC count of 12.8,  hemoglobin of 12.8.  Her baseline hemoglobin is 10.7 and she looks dehydrated.  I would assume with IV fluids, her hemoglobin would drop at least 2 g. INR 0.95.  Lipase normal.  Comprehensive metabolic panel significant for a sodium of 133.  Lactic acid normal.  Urinalysis shows small leukocytes, rare bacteria.  DIAGNOSTIC STUDIES:  None.  ASSESSMENT AND PLAN: 1. This is an 77 year old lady with a history of hypertension,     hyperlipidemia, diverticular bleed in less than 2 months, comes     back for melena and mild lower quadrant abdominal pain.  The     patient will be  admitted to [facility] for closer monitoring.  Even     though her abdominal pain is resolved at this time, we will get a     CT abdomen and pelvis with contrast to evaluate her diverticular     disease.  We will put her on clear liquid diet.  Gastroenterology     consult from Dr. Matthias Hughs was already called.  We will put her on IV     Protonix 40 mg daily.  Can be changed it to p.o. later on tomorrow. 2. Monitor H and H more closely.  Start her on IV fluids normal saline     at 50 mL/h. 3. Hypertension.  Blood pressure is slightly elevated.  We will     restart her home medications in a.m. her medications are     lisinopril, clonidine, metoprolol, and hydralazine. 4. Hyponatremia.  The patient has a history of mild SIADH, but the     patient also appears to be slightly dehydrated.  We will hydrate     her gently and repeat electrolytes in a.m. 5. Leukocytosis.  We will get a chest x-ray.  The patient is afebrile.     I would not treat her with antibiotics just for     a WBC count of 12.8.  We will get a chest x-ray.  Her urine appears     to be clean.  We will get a CT abdomen and pelvis, and if she has     diverticulitis, we will start her on IV antibiotics as needed.  For     DVT prophylaxis, SCDs.          ______________________________ Kathlen Mody, MD     VA/MEDQ  D:  01/31/2011  T:  01/31/2011  Job:  [id number]  Electronically Signed by Kathlen Mody MD on 02/05/2011 03:03:30 PM

## 2011-02-05 NOTE — Discharge Summary (Signed)
[name], Bridges            ACCOUNT NO.:  [id number]  MEDICAL RECORD NO.:  [id number]  LOCATION:  1438                         FACILITY:  [facility]  PHYSICIAN:  Hollice Espy, M.D.DATE OF BIRTH:  07-22-1918  DATE OF ADMISSION:  01/31/2011 DATE OF DISCHARGE:  02/02/2011                              DISCHARGE SUMMARY   ATTENDING PHYSICIAN:  Hollice Espy, M.D.  PRIMARY CARE PHYSICIAN:  Dr. [name]. Green  CONSULTANTS ON CASE:  Dr. Shirley Friar, [facility].  DISCHARGE DIAGNOSES: 1. Diverticular bleeding. 2. Anemia secondary to acute blood loss, anemia of chronic disease. 3. Malignant hypertension. 4. Hyperlipidemia. 5. Leukocytosis felt to be stress margination, now normalized. 6. Dehydration. Please note that all diagnoses were present on admission.  DISCHARGE MEDICATIONS:  The patient will continue all of her previous medicines without any changes. 1. Aciphex 20 p.o. daily. 2. Aspirin 325 p.o. daily. 3. Clonidine 0.1 p.o. t.i.d. 4. Demeclocycline 150 p.o. b.i.d. 5. Digoxin 0.125 p.o. daily. 6. Hydralazine 50 p.o. b.i.d. 7. Lisinopril 40 p.o. daily. 8. Lorazepam 0.5 p.o. b.i.d. 9. Metoprolol 100 p.o. b.i.d. 10.Remeron 15 mg p.o. at q.h.s. 11.Vitamin B12 500 mcg p.o. daily. 12.Vitamin D3 1000 mg p.o. b.i.d.  HOSPITAL COURSE:  The patient is an 77 year old white female with past medical history of hypertension and previous diverticular bleed, who presented to the emergency room on January 31, 2011, with multiple episodes of melena and mild abdominal pain.  She was noted to have a mildly elevated white blood cell count, and a hemoglobin of 12.8.  She was slightly dehydrated and normally her baseline hemoglobin is around 10.7, some of this was felt to be secondary to hemoconcentration.  She was treated with IV fluids.  Her lactic acid level was normal.  This did not look to be a GI bleed.  Serial H and H were followed, all of which remained stable.   [facility] was consulted, who has seen the patient before in the past.  They felt likely that this is diverticular bleed requiring observation and no colonoscopy.  However, an EGD was performed, which was unremarkable and therefore, it was certainly felt to be diverticular in nature.  By hospital day 2, the patient was feeling better.  Her hemoglobin was stable.  Her blood pressures had started to elevate, came up to the 150s and then higher, until as high as 200.  The patient has a history of labile hypertension.  Once she was cleared from a bleeding standpoint, was restarted on her p.o. medications, and since then has been doing well.  She is currently going back to her baseline.  She has had no further episodes of rectal bleeding, and she has been [facility], 2012.  Her disposition is improved.  Activity was slowly increased.  DISCHARGE DIET:  Heart healthy diet.  She will follow up with Dr. Murray Hodgkins in 2 weeks' time.     Hollice Espy, M.D.     SKK/MEDQ  D:  02/02/2011  T:  02/02/2011  Job:  [id number]  cc:   Bernette Redbird, M.D. Fax: [phone]  [name]. [name], M.D. Fax: [phone]  Electronically Signed by  Virginia Rochester M.D. on 02/05/2011 10:21:25 AM

## 2011-02-14 NOTE — Consult Note (Signed)
NAMEKESSLER, KOPINSKI            ACCOUNT NO.:  [id number]  MEDICAL RECORD NO.:  [id number]  LOCATION:  1438                         [facility]ACILITY:  [facility]  PHYSICIAN:  Shirley Friar, MDDATE OF BIRTH:  1918/05/23  DATE OF CONSULTATION: DATE OF DISCHARGE:                                CONSULTATION   INDICATION:  GI bleed.  HISTORY OF PRESENT ILLNESS:  Ms. Bey is an 77 year old white female who was sent from assisted living today because of 2 days of dark stool. According to the ER report, was noted as black stool for 2 days.  She denies any black, tarry, or malodorous stool and states her stool is a lot darker than normal, but is unable to tell me any other details in that.  She denies any red blood appearance to it and denies any hematemesis.  She did have an episode of vomiting shortly after eating 2 days ago, but denies any hematemesis or coffee-ground emesis.  She does take a daily regular aspirin 325 mg, but denies any other NSAIDs.  She denies any abdominal pain.  Two days ago, she had several episodes of dark stool.  Yesterday, she had 1 or 2 episodes and today she had a small amount.  In August, she was in the hospital for rectal bleeding that was thought to be diverticular in origin.  She was managed with conservative care and a colonoscopy was not attempted.  Her bleeding was thought to be diverticular in origin and she responded to supportive care and the bleeding resolved.  PAST MEDICAL HISTORY: 1. Atrial fibrillation. 2. Acid reflux. 3. Angina. 4. Anxiety. 5. Depression. 6. Hiatal hernia. 7. Hypertension. 8. Stroke. 9. Scoliosis.  MEDICINES:  Home medications, Aciphex, aspirin, digoxin, lisinopril, vitamin B12, demeclocycline, lorazepam, vitamin D3, clonidine, mirtazapine, metoprolol, and hydralazine.  Doses listed in hospital record.  ALLERGIES:  CODEINE.  FAMILY HISTORY:  Noncontributory.  SOCIAL HISTORY:  Lives in assisted living.  Denies  alcohol, tobacco, or drugs.  REVIEW OF SYSTEMS:  Negative from a GI standpoint as stated above.  PHYSICAL EXAMINATION:  VITAL SIGNS:  Temperature 98.6, pulse 69, blood pressure 156/54. GENERAL:  Elderly, frail, in no acute distress, alert. ABDOMEN:  Generalized tenderness with guarding, mild distention, positive bowel sounds.  LABORATORY DATA:  White blood count 12.8, hemoglobin 12.8, platelet count 297.  INR 0.95.  Additional labs noted and reviewed in hospital records.  IMPRESSION:  An 77 year old white female with 2 days of dark stool.  She denies it being black, tarry, or malodorous.  Questionable whether this is a true GI blood loss or not.  Recommend supportive care, clear liquid diet, NPO after midnight, and plan to do upper endoscopy as planned with Dr. Matthias Hughs tomorrow.  I do not think she needs a colonoscopy based on this presentation.  If her abdominal pain persists, may need imaging of her abdomen.  Recommend following her hemoglobin and hematocrit closely and continue IV fluid hydration.     Shirley Friar, MD     VCS/MEDQ  D:  01/31/2011  T:  01/31/2011  Job:  [id number]  cc:   Bernette Redbird, M.D. Fax: [phone]  Electronically Signed by Charlott Rakes MD on 02/14/2011 10:48:11  AM

## 2011-04-12 ENCOUNTER — Other Ambulatory Visit: Payer: Self-pay | Admitting: Internal Medicine

## 2011-04-12 DIAGNOSIS — R51 Headache: Secondary | ICD-10-CM

## 2011-04-15 ENCOUNTER — Ambulatory Visit
Admission: RE | Admit: 2011-04-15 | Discharge: 2011-04-15 | Disposition: A | Discharge status: Home / Self Care | Payer: Medicare Other | Source: Ambulatory Visit | Attending: Internal Medicine | Admitting: Internal Medicine

## 2011-04-15 DIAGNOSIS — R51 Headache: Secondary | ICD-10-CM

## 2011-04-19 ENCOUNTER — Ambulatory Visit
Admission: RE | Admit: 2011-04-19 | Discharge: 2011-04-19 | Disposition: A | Discharge status: Home / Self Care | Payer: Medicare Other | Source: Ambulatory Visit | Attending: Internal Medicine | Admitting: Internal Medicine

## 2011-04-19 DIAGNOSIS — R51 Headache: Secondary | ICD-10-CM

## 2012-03-25 ENCOUNTER — Other Ambulatory Visit: Payer: Self-pay | Admitting: Internal Medicine

## 2012-03-25 DIAGNOSIS — M549 Dorsalgia, unspecified: Secondary | ICD-10-CM

## 2012-03-25 DIAGNOSIS — R110 Nausea: Secondary | ICD-10-CM

## 2012-03-25 DIAGNOSIS — R109 Unspecified abdominal pain: Secondary | ICD-10-CM

## 2012-03-30 ENCOUNTER — Ambulatory Visit
Admission: RE | Admit: 2012-03-30 | Discharge: 2012-03-30 | Disposition: A | Discharge status: Home / Self Care | Payer: Medicare Other | Source: Ambulatory Visit | Attending: Internal Medicine | Admitting: Internal Medicine

## 2012-03-30 DIAGNOSIS — R109 Unspecified abdominal pain: Secondary | ICD-10-CM

## 2012-03-30 DIAGNOSIS — R110 Nausea: Secondary | ICD-10-CM

## 2012-03-30 DIAGNOSIS — M549 Dorsalgia, unspecified: Secondary | ICD-10-CM

## 2012-03-30 MED ORDER — IOHEXOL 300 MG/ML  SOLN
100.0000 mL | Freq: Once | INTRAMUSCULAR | Status: AC | PRN
  Administered 2012-03-30: 100 mL via INTRAVENOUS

## 2012-04-06 ENCOUNTER — Encounter (HOSPITAL_COMMUNITY): Payer: Self-pay | Admitting: *Deleted

## 2012-04-06 ENCOUNTER — Emergency Department (HOSPITAL_COMMUNITY): Payer: Medicare Other

## 2012-04-06 ENCOUNTER — Other Ambulatory Visit: Payer: Self-pay

## 2012-04-06 ENCOUNTER — Inpatient Hospital Stay (HOSPITAL_COMMUNITY)
Admission: EM | Admit: 2012-04-06 | Discharge: 2012-04-11 | DRG: 070 | Disposition: A | Discharge status: Skilled Nursing Facility | Payer: Medicare Other | Attending: Internal Medicine | Admitting: Internal Medicine

## 2012-04-06 ENCOUNTER — Inpatient Hospital Stay (HOSPITAL_COMMUNITY): Payer: Medicare Other

## 2012-04-06 DIAGNOSIS — G9341 Metabolic encephalopathy: Principal | ICD-10-CM | POA: Diagnosis present

## 2012-04-06 DIAGNOSIS — I4891 Unspecified atrial fibrillation: Secondary | ICD-10-CM | POA: Diagnosis present

## 2012-04-06 DIAGNOSIS — Z66 Do not resuscitate: Secondary | ICD-10-CM | POA: Diagnosis present

## 2012-04-06 DIAGNOSIS — Z8719 Personal history of other diseases of the digestive system: Secondary | ICD-10-CM

## 2012-04-06 DIAGNOSIS — N189 Chronic kidney disease, unspecified: Secondary | ICD-10-CM | POA: Diagnosis present

## 2012-04-06 DIAGNOSIS — G9340 Encephalopathy, unspecified: Secondary | ICD-10-CM | POA: Diagnosis present

## 2012-04-06 DIAGNOSIS — K859 Acute pancreatitis without necrosis or infection, unspecified: Secondary | ICD-10-CM | POA: Diagnosis present

## 2012-04-06 DIAGNOSIS — N179 Acute kidney failure, unspecified: Secondary | ICD-10-CM | POA: Diagnosis present

## 2012-04-06 DIAGNOSIS — R4182 Altered mental status, unspecified: Secondary | ICD-10-CM | POA: Diagnosis present

## 2012-04-06 DIAGNOSIS — D72829 Elevated white blood cell count, unspecified: Secondary | ICD-10-CM | POA: Diagnosis present

## 2012-04-06 DIAGNOSIS — R9431 Abnormal electrocardiogram [ECG] [EKG]: Secondary | ICD-10-CM | POA: Diagnosis present

## 2012-04-06 DIAGNOSIS — I10 Essential (primary) hypertension: Secondary | ICD-10-CM | POA: Diagnosis present

## 2012-04-06 DIAGNOSIS — R4020 Unspecified coma: Secondary | ICD-10-CM

## 2012-04-06 DIAGNOSIS — E871 Hypo-osmolality and hyponatremia: Secondary | ICD-10-CM | POA: Diagnosis present

## 2012-04-06 DIAGNOSIS — R55 Syncope and collapse: Secondary | ICD-10-CM | POA: Diagnosis present

## 2012-04-06 DIAGNOSIS — E876 Hypokalemia: Secondary | ICD-10-CM | POA: Diagnosis present

## 2012-04-06 HISTORY — DX: Hypo-osmolality and hyponatremia: E87.1

## 2012-04-06 HISTORY — DX: Personal history of other diseases of the digestive system: Z87.19

## 2012-04-06 HISTORY — DX: Syncope and collapse: R55

## 2012-04-06 HISTORY — DX: Hypothyroidism, unspecified: E03.9

## 2012-04-06 HISTORY — DX: Unspecified fracture of unspecified lumbar vertebra, initial encounter for closed fracture: S32.009A

## 2012-04-06 HISTORY — DX: Low back pain, unspecified: M54.50

## 2012-04-06 HISTORY — DX: Unspecified coma: R40.20

## 2012-04-06 HISTORY — DX: Low back pain: M54.5

## 2012-04-06 HISTORY — DX: Unspecified atrial fibrillation: I48.91

## 2012-04-06 LAB — CBC WITH DIFFERENTIAL/PLATELET
Basophils Relative: 0 % (ref 0–1)
Eosinophils Absolute: 0 10*3/uL (ref 0.0–0.7)
HCT: 46.7 % — ABNORMAL HIGH (ref 36.0–46.0)
Hemoglobin: 15.8 g/dL — ABNORMAL HIGH (ref 12.0–15.0)
MCH: 30.9 pg (ref 26.0–34.0)
MCHC: 33.8 g/dL (ref 30.0–36.0)
Monocytes Absolute: 0.8 10*3/uL (ref 0.1–1.0)
Monocytes Relative: 6 % (ref 3–12)

## 2012-04-06 LAB — CBC
MCH: 30.2 pg (ref 26.0–34.0)
MCHC: 34.4 g/dL (ref 30.0–36.0)
RDW: 16.7 % — ABNORMAL HIGH (ref 11.5–15.5)

## 2012-04-06 LAB — DIGOXIN LEVEL: Digoxin Level: 1 ng/mL (ref 0.8–2.0)

## 2012-04-06 LAB — COMPREHENSIVE METABOLIC PANEL
AST: 64 U/L — ABNORMAL HIGH (ref 0–37)
Albumin: 3.1 g/dL — ABNORMAL LOW (ref 3.5–5.2)
Chloride: 95 mEq/L — ABNORMAL LOW (ref 96–112)
Creatinine, Ser: 1.65 mg/dL — ABNORMAL HIGH (ref 0.50–1.10)
Potassium: 4.6 mEq/L (ref 3.5–5.1)
Total Bilirubin: 0.5 mg/dL (ref 0.3–1.2)
Total Protein: 6.3 g/dL (ref 6.0–8.3)

## 2012-04-06 LAB — URINALYSIS, MICROSCOPIC ONLY
Glucose, UA: NEGATIVE mg/dL
Hgb urine dipstick: NEGATIVE
Ketones, ur: NEGATIVE mg/dL
Protein, ur: >300 mg/dL — AB

## 2012-04-06 LAB — CREATININE, SERUM
Creatinine, Ser: 2.05 mg/dL — ABNORMAL HIGH (ref 0.50–1.10)
GFR calc non Af Amer: 20 mL/min — ABNORMAL LOW (ref >90)

## 2012-04-06 LAB — MRSA PCR SCREENING: MRSA by PCR: NEGATIVE

## 2012-04-06 MED ORDER — DEXTROSE-NACL 5-0.45 % IV SOLN: INTRAVENOUS | Status: DC

## 2012-04-06 MED ORDER — ENOXAPARIN SODIUM 30 MG/0.3ML ~~LOC~~ SOLN
30.0000 mg | SUBCUTANEOUS | Status: DC
  Administered 2012-04-06 – 2012-04-07 (×2): 30 mg via SUBCUTANEOUS
  Filled 2012-04-06 (×3): qty 0.3

## 2012-04-06 MED ORDER — LORAZEPAM 0.5 MG PO TABS
0.5000 mg | ORAL_TABLET | Freq: Four times a day (QID) | ORAL | Status: DC | PRN
  Administered 2012-04-08: 0.5 mg via ORAL
  Filled 2012-04-06: qty 1

## 2012-04-06 MED ORDER — MAGNESIUM HYDROXIDE 400 MG/5ML PO SUSP: 30.0000 mL | Freq: Every day | ORAL | Status: DC | PRN

## 2012-04-06 MED ORDER — ACETAMINOPHEN 650 MG RE SUPP
650.0000 mg | Freq: Two times a day (BID) | RECTAL | Status: DC
  Administered 2012-04-06 – 2012-04-08 (×4): 650 mg via RECTAL
  Filled 2012-04-06 (×4): qty 1

## 2012-04-06 MED ORDER — ACETAMINOPHEN 325 MG PO TABS
650.0000 mg | ORAL_TABLET | Freq: Four times a day (QID) | ORAL | Status: DC | PRN
  Administered 2012-04-08: 650 mg via ORAL
  Filled 2012-04-06: qty 2

## 2012-04-06 MED ORDER — METOPROLOL TARTRATE 100 MG PO TABS
100.0000 mg | ORAL_TABLET | Freq: Two times a day (BID) | ORAL | Status: DC
  Administered 2012-04-06: 100 mg via ORAL
  Filled 2012-04-06 (×3): qty 1

## 2012-04-06 MED ORDER — SODIUM CHLORIDE 0.9 % IV SOLN: INTRAVENOUS | Status: DC

## 2012-04-06 MED ORDER — PANTOPRAZOLE SODIUM 40 MG IV SOLR
40.0000 mg | Freq: Two times a day (BID) | INTRAVENOUS | Status: DC
  Administered 2012-04-06 – 2012-04-09 (×6): 40 mg via INTRAVENOUS
  Filled 2012-04-06 (×7): qty 40

## 2012-04-06 MED ORDER — DEXTROSE-NACL 5-0.9 % IV SOLN
INTRAVENOUS | Status: DC
  Administered 2012-04-06 – 2012-04-07 (×2): via INTRAVENOUS

## 2012-04-06 MED ORDER — SODIUM CHLORIDE 0.9 % IJ SOLN
3.0000 mL | Freq: Two times a day (BID) | INTRAMUSCULAR | Status: DC
  Administered 2012-04-06 – 2012-04-11 (×6): 3 mL via INTRAVENOUS

## 2012-04-06 MED ORDER — ACETAMINOPHEN 650 MG RE SUPP
650.0000 mg | Freq: Four times a day (QID) | RECTAL | Status: DC | PRN
  Filled 2012-04-06: qty 1

## 2012-04-06 MED ORDER — DIGOXIN 0.0625 MG HALF TABLET
0.0625 mg | ORAL_TABLET | Freq: Every morning | ORAL | Status: DC
  Filled 2012-04-06: qty 1

## 2012-04-06 NOTE — Progress Notes (Signed)
CSW confirmed with [facility] that patient is a resident there. CSW unable to complete assessment with pt and pt caregiver at this time, pt currently receiving care from rn   .Catha Gosselin, Theresia Majors  [phone] .04/06/2012

## 2012-04-06 NOTE — ED Notes (Signed)
Per caregiver pt was at [facility] for a follow up when she siad she wanted to lay down and couldn't wait. Pt laid down and went unresponsive. Per caregiver pt had her eyes open but was not responding at all. Pt now is moaning and is asking for help.

## 2012-04-06 NOTE — ED Notes (Signed)
ZOX:[id number]<AV> Expected date:04/06/12<BR> Expected time:11:34 AM<BR> Means of arrival:Ambulance<BR> Comments:<BR> 89yoF/unresponsive/VS WNL

## 2012-04-06 NOTE — ED Provider Notes (Signed)
History     CSN: [id number]  Arrival date & time 04/06/12  1152   First MD Initiated Contact with Patient 04/06/12 1209      Chief Complaint  Patient presents with  . Altered Mental Status   level V caveat due to altered mental status.  (Consider location/radiation/quality/duration/timing/severity/associated sxs/prior treatment) Patient is a 78 y.o. female presenting with altered mental status. The history is provided by the patient.  Altered Mental Status This is a new problem.   patient was brought in for altered mental status. She was asked to further pedis for evaluation of her back pain. While there she is stating that she needed to light down and become less responsive. Now she is only moaning. Patient's caregiver thinks she may have given him. She is a DO NOT RESUSCITATE. She's not been eating as much recently. Unknown fevers. Patient's caregiver states that the patient's primary care Dr. thinks that the patient may have had a stroke on Friday and maybe another day.  Past Medical History  Diagnosis Date  . Hypertension   . Hypothyroidism   . Atrial fibrillation   . Hyponatremia     hx of  . Syncope     hx syncope and collapse per paper from [facility]  . Unconscious 04/06/12    per [facility] paper -hx unconscious episodes -pt asked not to be sent to hospital for these  . Fx lumbar vertebra-closed     hx [facility]  . Lower back pain     hx of    History reviewed. No pertinent past surgical history.  History reviewed. No pertinent family history.  History  Substance Use Topics  . Smoking status: Not on file  . Smokeless tobacco: Never Used  . Alcohol Use: No    OB History    Grav Para Term Preterm Abortions TAB SAB Ect Mult Living                  Review of Systems  Unable to perform ROS: Mental status change  Constitutional: Positive for activity change and appetite change.  Gastrointestinal: Negative for vomiting and  diarrhea.  Psychiatric/Behavioral: Positive for altered mental status.    Allergies  Codeine  Home Medications   Current Outpatient Rx  Name  Route  Sig  Dispense  Refill  . ACETAMINOPHEN 650 MG RE SUPP   Rectal   Place 650 mg rectally 2 (two) times daily. For pain and discomfort         . ASPIRIN EC 81 MG PO TBEC   Oral   Take 81 mg by mouth every morning.         Marland Kitchen CASCARA SAGRADA 450 MG PO CAPS   Oral   Take 450 mg by mouth daily as needed. For constipation         . VITAMIN D 2000 UNITS PO TABS   Oral   Take 2,000 Units by mouth every morning.         Marland Kitchen CLONIDINE HCL 0.1 MG PO TABS   Oral   Take 0.1 mg by mouth 3 (three) times daily.         . DEMECLOCYCLINE HCL 150 MG PO TABS   Oral   Take 150 mg by mouth 2 (two) times daily.         Marland Kitchen DIGOXIN 0.125 MG PO TABS   Oral   Take 0.0625 mg by mouth every morning.         Marland Kitchen  HYDRALAZINE HCL 50 MG PO TABS   Oral   Take 50 mg by mouth 2 (two) times daily.         . IBUPROFEN 200 MG PO CAPS   Oral   Take 400 mg by mouth every 6 (six) hours as needed. For pain         . LISINOPRIL 40 MG PO TABS   Oral   Take 40 mg by mouth every morning.         Marland Kitchen LORAZEPAM 0.5 MG PO TABS   Oral   Take 0.5 mg by mouth 2 (two) times daily.         Marland Kitchen LORAZEPAM 0.5 MG PO TABS   Oral   Take 0.5 mg by mouth every 6 (six) hours as needed. For restless or anxiety         . MAGNESIUM HYDROXIDE 400 MG/5ML PO SUSP   Oral   Take 30 mLs by mouth daily as needed. For constipation         . METOPROLOL TARTRATE 100 MG PO TABS   Oral   Take 100 mg by mouth 2 (two) times daily.         Marland Kitchen OMEPRAZOLE 20 MG PO CPDR   Oral   Take 20 mg by mouth every morning.         Marland Kitchen POTASSIUM CHLORIDE ER 10 MEQ PO TBCR   Oral   Take 10 mEq by mouth 2 (two) times daily.         Marland Kitchen VITAMIN B-12 500 MCG PO TABS   Oral   Take 500 mcg by mouth every morning.           BP 121/54  Pulse 72  Temp 96.5 F (35.8 C)  (Rectal)  Resp 24  SpO2 98%  Physical Exam  Constitutional: She appears well-developed.  HENT:  Head: Normocephalic.  Eyes:       Pupils are equal and react to light  Neck: Neck supple.  Cardiovascular: Normal rate.   Pulmonary/Chest: Effort normal. She has wheezes.       Harsh breath sounds throughout.  Abdominal: There is tenderness.       Diffuse tenderness to abdomen. No clear distention.  Neurological:       Patient will moan and is minimally verbal. She appears flaccid on the right. She still has some movement of her left upper extremity. Will squeeze hand to command on left. flacid lower extremity.  Skin:       Hands are cool    ED Course  Procedures (including critical care time)  Labs Reviewed  CBC WITH DIFFERENTIAL - Abnormal; Notable for the following:    WBC 13.2 (*)     Hemoglobin 15.8 (*)     HCT 46.7 (*)     RDW 17.0 (*)     Neutrophils Relative 83 (*)     Neutro Abs 11.0 (*)     Lymphocytes Relative 10 (*)     All other components within normal limits  URINALYSIS, MICROSCOPIC ONLY - Abnormal; Notable for the following:    Color, Urine AMBER (*)  BIOCHEMICALS MAY BE AFFECTED BY COLOR   APPearance TURBID (*)     Protein, ur >300 (*)     Casts HYALINE CASTS (*)  GRANULAR CAST   All other components within normal limits  COMPREHENSIVE METABOLIC PANEL - Abnormal; Notable for the following:    Sodium 132 (*)     Chloride 95 (*)  BUN 64 (*)     Creatinine, Ser 1.65 (*)     Albumin 3.1 (*)     AST 64 (*)     ALT 36 (*)     Alkaline Phosphatase 183 (*)     GFR calc non Af Amer 26 (*)     GFR calc Af Amer 30 (*)     All other components within normal limits  LIPASE, BLOOD - Abnormal; Notable for the following:    Lipase 203 (*)     All other components within normal limits  OCCULT BLOOD, POC DEVICE - Abnormal; Notable for the following:    Fecal Occult Bld POSITIVE (*)     All other components within normal limits  LACTIC ACID, PLASMA  DIGOXIN  LEVEL  TROPONIN I  URINALYSIS, ROUTINE W REFLEX MICROSCOPIC  OCCULT BLOOD X 1 CARD TO LAB, STOOL   Ct Head Wo Contrast  04/06/2012  *RADIOLOGY REPORT*  Clinical Data: Unresponsive.  Altered mental status.  CT HEAD WITHOUT CONTRAST  Technique:  Contiguous axial images were obtained from the base of the skull through the vertex without contrast.  Comparison: 04/15/2011 CT. 11/30/2010 CT.  11/16/2010 MR.  Findings: No intracranial hemorrhage.  Prominent small vessel disease type changes and remote right caudate head infarct without CT evidence of large acute infarct. Density of the basilar artery similar to prior exams.  No intracranial mass lesion detected on this unenhanced exam.  Global atrophy without hydrocephalus.  Vascular calcifications.  IMPRESSION: No intracranial hemorrhage.  Small vessel disease type changes and old right caudate infarct without CT evidence of large acute infarct.   Original Report Authenticated By: Lacy Duverney, M.D.    Dg Abd Acute W/chest  04/06/2012  *RADIOLOGY REPORT*  Clinical Data: Abdominal pain  ACUTE ABDOMEN SERIES (ABDOMEN 2 VIEW & CHEST 1 VIEW)  Comparison: CT 04/01/2012  Findings: Lungs are clear.  Heart size upper limits normal. Ectatic thoracic aorta.  No effusion.  No free air.  Extensive aortoiliac arterial calcifications.  Paucity of bowel gas. Residual oral contrast in the distal colon, opacifying multiple diverticula.  Spondylitic changes in the lumbar spine.  IMPRESSION:  1.  Nonobstructive bowel gas pattern. 2.  No free air. 3.  Sigmoid diverticula. 4.  No acute cardiopulmonary disease.   Original Report Authenticated By: D. Andria Rhein, MD      1. Pancreatitis   2. Altered mental status     Date: 04/06/2012  Rate: 72  Rhythm: normal sinus rhythm  QRS Axis: normal  Intervals: normal  ST/T Wave abnormalities: nonspecific ST/T changes  Conduction Disutrbances:LVH  Narrative Interpretation:   Old EKG Reviewed: unchanged     MDM  Patient  presents with altered mental status and syncope. His reported history of syncopal episodes. She is tender on her abdomen. Laboratory was a new pancreatitis. CT scan done in the last week showed some swelling of the pancreatic duct. No biliary problems. Patient continues to be somewhat altered. She'll be admitted to medicine.        Juliet Rude. Rubin Payor, MD 04/06/12 [id number]

## 2012-04-06 NOTE — ED Notes (Signed)
IV Team returned call- PICC line can not be placed until tomorrow.

## 2012-04-06 NOTE — H&P (Addendum)
[facility] History and Physical  [name] NWG:[id number] DOB: 1918/09/11 DOA: 04/06/2012  Referring physician:[place]. PCP: Kimber Relic, MD    Chief Complaint: AMS.  HPI: [name] is a 78 y.o. female was at orthopedic office for evaluation of back pain when patient wanted to lying down. Patient laid down and went unresponsive. History was obtain from ED records and care giver. Per care giver patient has had 2 episodes this past week where she become unresponsive. She has been mor weak and sleeping more during the days. Patient was transfer to SNF care from ALF due to deconditioning. Patient has decrease oral intake and has been complaining of back pain.  Patient answer some question. Denies abdominal pain. She doesn't know what happen. She has her eyes close. She is confuse. She denies chest pain, dyspnea.    Review of Systems: limited due to AMS.  Past Medical History  Diagnosis Date  . Hypertension   . Hypothyroidism   . Atrial fibrillation   . Hyponatremia     hx of  . Syncope     hx syncope and collapse per paper from [facility]  . Unconscious 04/06/12    per [facility] paper -hx unconscious episodes -pt asked not to be sent to hospital for these  . Fx lumbar vertebra-closed     hx of [facility]  . Lower back pain     hx of   History reviewed. No pertinent past surgical history. Social History:  does not have a smoking history on file. She has never used smokeless tobacco. She reports that she does not drink alcohol or use illicit drugs.   Allergies  Allergen Reactions  . Codeine Other (See Comments)    Unknown, MAR   Family History: Unable to provide family history.   Prior to Admission medications   Medication Sig Start Date End Date Taking? Authorizing Provider  acetaminophen (TYLENOL) 650 MG suppository Place 650 mg rectally 2 (two) times daily. For pain and discomfort   Yes Historical Provider, MD   aspirin EC 81 MG tablet Take 81 mg by mouth every morning.   Yes Historical Provider, MD  [place] 450 MG CAPS Take 450 mg by mouth daily as needed. For constipation   Yes Historical Provider, MD  Cholecalciferol (VITAMIN D) 2000 UNITS tablet Take 2,000 Units by mouth every morning.   Yes Historical Provider, MD  cloNIDine (CATAPRES) 0.1 MG tablet Take 0.1 mg by mouth 3 (three) times daily.   Yes Historical Provider, MD  demeclocycline (DECLOMYCIN) 150 MG tablet Take 150 mg by mouth 2 (two) times daily.   Yes Historical Provider, MD  digoxin (LANOXIN) 0.125 MG tablet Take 0.0625 mg by mouth every morning.   Yes Historical Provider, MD  hydrALAZINE (APRESOLINE) 50 MG tablet Take 50 mg by mouth 2 (two) times daily.   Yes Historical Provider, MD  Ibuprofen (MIDOL) 200 MG CAPS Take 400 mg by mouth every 6 (six) hours as needed. For pain   Yes Historical Provider, MD  lisinopril (PRINIVIL,ZESTRIL) 40 MG tablet Take 40 mg by mouth every morning.   Yes Historical Provider, MD  LORazepam (ATIVAN) 0.5 MG tablet Take 0.5 mg by mouth 2 (two) times daily.   Yes Historical Provider, MD  LORazepam (ATIVAN) 0.5 MG tablet Take 0.5 mg by mouth every 6 (six) hours as needed. For restless or anxiety   Yes Historical Provider, MD  magnesium hydroxide (MILK OF MAGNESIA) 400 MG/5ML suspension Take  30 mLs by mouth daily as needed. For constipation   Yes Historical Provider, MD  metoprolol (LOPRESSOR) 100 MG tablet Take 100 mg by mouth 2 (two) times daily.   Yes Historical Provider, MD  omeprazole (PRILOSEC) 20 MG capsule Take 20 mg by mouth every morning.   Yes Historical Provider, MD  potassium chloride (K-DUR) 10 MEQ tablet Take 10 mEq by mouth 2 (two) times daily.   Yes Historical Provider, MD  vitamin B-12 (CYANOCOBALAMIN) 500 MCG tablet Take 500 mcg by mouth every morning.   Yes Historical Provider, MD   Physical Exam: Filed Vitals:   04/06/12 1600 04/06/12 1616 04/06/12 1624 04/06/12 1724  BP: 121/54  121/54  153/52  Pulse: 65 72  71  Temp:   99.1 F (37.3 C) 97.6 F (36.4 C)  TempSrc:   Rectal Axillary  Resp:  24  19  Height:    5\' 5"  (1.651 m)  Weight:    87 kg (191 lb 12.8 oz)  SpO2: 96% 98%  98%    General Appearance:    Eyes close, answer some questions, no distress, appears stated age  Head:    Normocephalic, without obvious abnormality, atraumatic  Eyes:    PERRL, conjunctiva/corneas clear, EOM's intact    Ears:    Normal TM's and external ear canals, both ears  Nose:   Nares normal, septum midline, mucosa normal, no drainage    or sinus tenderness  Throat:   Lips, mucosa, and tongue dry;   Neck:   Supple, symmetrical, trachea midline, no adenopathy;    thyroid:  no enlargement/tenderness/nodules; no carotid   bruit or JVD     Lungs:     Clear to auscultation bilaterally, respirations unlabored  Chest Wall:    No tenderness or deformity   Heart:    Regular rate and rhythm, S1 and S2 normal, no murmur, rub   or gallop     Abdomen:     Soft, mild -tender, bowel sounds active all four quadrants,    no masses, no organomegaly        Extremities:   Extremities normal, atraumatic, no cyanosis or edema  Pulses:   2+ and symmetric all extremities  Skin:   Skin color, texture, turgor normal, no rashes or lesions     Neurologic:   CNII-XII intact, normal strength, sensation and reflexes    throughout    Labs on Admission:  Basic Metabolic Panel:  Lab 04/06/12 1610  NA 132*  K 4.6  CL 95*  CO2 24  GLUCOSE 95  BUN 64*  CREATININE 1.65*  CALCIUM 9.2  MG --  PHOS --   Liver Function Tests:  Lab 04/06/12 1448  AST 64*  ALT 36*  ALKPHOS 183*  BILITOT 0.5  PROT 6.3  ALBUMIN 3.1*    Lab 04/06/12 1448  LIPASE 203*  AMYLASE --   No results found for this basename: AMMONIA:5 in the last 168 hours CBC:  Lab 04/06/12 1225  WBC 13.2*  NEUTROABS 11.0*  HGB 15.8*  HCT 46.7*  MCV 91.4  PLT 235   Cardiac Enzymes:  Lab 04/06/12 1448  CKTOTAL --  CKMB  --  CKMBINDEX --  TROPONINI <0.30    BNP (last 3 results) No results found for this basename: PROBNP:3 in the last 8760 hours CBG: No results found for this basename: GLUCAP:5 in the last 168 hours  Radiological Exams on Admission: Ct Head Wo Contrast  04/06/2012  *RADIOLOGY REPORT*  Clinical Data: Unresponsive.  Altered mental status.  CT HEAD WITHOUT CONTRAST  Technique:  Contiguous axial images were obtained from the base of the skull through the vertex without contrast.  Comparison: 04/15/2011 CT. 11/30/2010 CT.  11/16/2010 MR.  Findings: No intracranial hemorrhage.  Prominent small vessel disease type changes and remote right caudate head infarct without CT evidence of large acute infarct. Density of the basilar artery similar to prior exams.  No intracranial mass lesion detected on this unenhanced exam.  Global atrophy without hydrocephalus.  Vascular calcifications.  IMPRESSION: No intracranial hemorrhage.  Small vessel disease type changes and old right caudate infarct without CT evidence of large acute infarct.   Original Report Authenticated By: Lacy Duverney, M.D.    Dg Abd Acute W/chest  04/06/2012  *RADIOLOGY REPORT*  Clinical Data: Abdominal pain  ACUTE ABDOMEN SERIES (ABDOMEN 2 VIEW & CHEST 1 VIEW)  Comparison: CT 04/01/2012  Findings: Lungs are clear.  Heart size upper limits normal. Ectatic thoracic aorta.  No effusion.  No free air.  Extensive aortoiliac arterial calcifications.  Paucity of bowel gas. Residual oral contrast in the distal colon, opacifying multiple diverticula.  Spondylitic changes in the lumbar spine.  IMPRESSION:  1.  Nonobstructive bowel gas pattern. 2.  No free air. 3.  Sigmoid diverticula. 4.  No acute cardiopulmonary disease.   Original Report Authenticated By: D. Andria Rhein, MD       Assessment/Plan Active Problems:  * No active hospital problems. *   1-Questionable Syncope/ AMS:  Admit to telemetry, cycle cardiac enzymes, EKG, CT head no acute  intracranial abnormalities.  2-AMS/Encephalopathy: In setting dehydration, mild uremia, metabolic abnormalities. IV fluids. Work up for infection. 3-Pancreatitis: Repeat lipase in am. Need to assess symptoms when patient able to provide better history. Will consider repeat CT abdomen. She had CT abdomen/pelvis 12-16 that show The pancreatic duct is somewhat more prominent than noted on CT of 2012, of questionable significance. No definite mass is seen. Mild increase LFT, check [place]. IV fluids.  4-Acute on chronic  Renal Failure: Last cr per records 0.7 2012. Likely pre renal, decrease volume, ibuprofen use, ACE. Hold nephrotoxic. IV fluids.  5-Leukocytosis: Work up for infection. Check Urine culture, blood culture. Probably related to mild pancreatitis.  Code Status: DNR. Family Communication: I spoke with daughter [name], and gave her updates. She confirm to me DNR status.  Disposition Plan: Admit for IV fluids, possible pancreatitis. Expect 3 to 4 days inpatient.   Time spent: More than 70 minutes.   Koty Anctil [facility] Pager [phone]  If 7PM-7AM, please contact night-coverage [email or web address] Password [facility] 04/06/2012, 6:22 PM

## 2012-04-06 NOTE — Progress Notes (Signed)
Clinical Social Work Department BRIEF PSYCHOSOCIAL ASSESSMENT 04/06/2012  Patient:  Anne, Bridges     Account Number:  [id number]     Admit date:  04/06/2012  Clinical Social Worker:  Doree Albee  Date/Time:  04/06/2012 04:30 PM  Referred by:  CSW  Date Referred:  04/06/2012 Referred for  SNF Placement   Other Referral:   Interview type:  Patient Other interview type:   patient private sitter    PSYCHOSOCIAL DATA Living Status:  FACILITY Admitted from facility:  [facility] Level of care:  Skilled Nursing Facility Primary support name:  [name] Primary support relationship to patient:  CHILD, ADULT Degree of support available:   moderate    [name], [id number]    [name], pt gdaughter: [phone]    CURRENT CONCERNS Current Concerns  Post-Acute Placement   Other Concerns:    SOCIAL WORK ASSESSMENT / PLAN CSW met with pt and pt private sitter at bedside to complete psychosocial assessment. Pt currently still only oriented to self. Patient is verbal however csw unable to understand patient, pt mostly moaning.    Pt private sitter states that patient has sitter every day during the day.    CSW spoke with [name] who confirmed that patient is a resident at [facility] SNF and will return when medically stable. [name] plans to visit pt in hosptial tomorrow along with pt granddaughter [name].    CSW confirmed with [facility] that patient is a resident there, and plans to return when medically stable. SNF confirmed that patient family provides private sitter for patient.    CSW will complete fl2 for MD signature and place in patient shadow chart.    Per chart review, pt is a DNR however a yellow dnr form will need to be completed by attending before patient returns to SNF. Pt only has a copy of DNR in patient chart from SNF.   Assessment/plan status:  Psychosocial Support/Ongoing  Assessment of Needs Other assessment/ plan:   Information/referral to community resources:   none identified at this time    PATIENT'S/FAMILY'S RESPONSE TO PLAN OF CARE: Pt family and pt private sitter thanked csw for concern and support. pt family are motivated for patient to return to [facility] when medically stable.      Catha Gosselin, LCSWA  [phone] .04/06/2012 1704pm

## 2012-04-06 NOTE — ED Notes (Signed)
Per EMS report pt was at [facility] where she felt bed, asked to lie down and has been unresponsive since. Per EMS pt only moans to painful stimuli. Per EMS pt is DNR.

## 2012-04-06 NOTE — ED Notes (Signed)
Patient more alert and frequently trying to pull bed linen off and get out of bed. Home sitter at the bedside.

## 2012-04-06 NOTE — Progress Notes (Signed)
Consulted with ED SW about pt Disposition is snf

## 2012-04-07 ENCOUNTER — Inpatient Hospital Stay (HOSPITAL_COMMUNITY): Payer: Medicare Other

## 2012-04-07 DIAGNOSIS — N179 Acute kidney failure, unspecified: Secondary | ICD-10-CM

## 2012-04-07 DIAGNOSIS — K859 Acute pancreatitis without necrosis or infection, unspecified: Secondary | ICD-10-CM

## 2012-04-07 DIAGNOSIS — N189 Chronic kidney disease, unspecified: Secondary | ICD-10-CM

## 2012-04-07 DIAGNOSIS — R4182 Altered mental status, unspecified: Secondary | ICD-10-CM

## 2012-04-07 DIAGNOSIS — G9340 Encephalopathy, unspecified: Secondary | ICD-10-CM

## 2012-04-07 LAB — COMPREHENSIVE METABOLIC PANEL
ALT: 29 U/L (ref 0–35)
CO2: 25 mEq/L (ref 19–32)
Calcium: 8.7 mg/dL (ref 8.4–10.5)
Chloride: 101 mEq/L (ref 96–112)
GFR calc Af Amer: 23 mL/min — ABNORMAL LOW (ref >90)
GFR calc non Af Amer: 20 mL/min — ABNORMAL LOW (ref >90)
Glucose, Bld: 125 mg/dL — ABNORMAL HIGH (ref 70–99)
Sodium: 137 mEq/L (ref 135–145)
Total Bilirubin: 0.4 mg/dL (ref 0.3–1.2)

## 2012-04-07 LAB — CBC
Hemoglobin: 12.9 g/dL (ref 12.0–15.0)
MCH: 30.1 pg (ref 26.0–34.0)
MCV: 87.4 fL (ref 78.0–100.0)
RBC: 4.28 MIL/uL (ref 3.87–5.11)

## 2012-04-07 LAB — VITAMIN B12: Vitamin B-12: >2000 pg/mL — ABNORMAL HIGH (ref 211–911)

## 2012-04-07 MED ORDER — SODIUM CHLORIDE 0.9 % IV SOLN
INTRAVENOUS | Status: DC
  Administered 2012-04-07 – 2012-04-08 (×4): via INTRAVENOUS
  Administered 2012-04-08 (×2): 150 mL/h via INTRAVENOUS

## 2012-04-07 MED ORDER — SODIUM CHLORIDE 0.9 % IV SOLN
Freq: Once | INTRAVENOUS | Status: AC
  Administered 2012-04-07: 08:00:00 via INTRAVENOUS

## 2012-04-07 MED ORDER — DIGOXIN 0.25 MG/ML IJ SOLN
0.0625 mg | Freq: Every day | INTRAMUSCULAR | Status: DC
  Administered 2012-04-07 – 2012-04-08 (×2): 0.0625 mg via INTRAVENOUS
  Administered 2012-04-09: 0.25 mg via INTRAVENOUS
  Filled 2012-04-07 (×3): qty 0.5

## 2012-04-07 MED ORDER — LIDOCAINE HCL (PF) 1 % IJ SOLN
INTRAMUSCULAR | Status: AC
  Filled 2012-04-07: qty 30

## 2012-04-07 MED ORDER — METOPROLOL TARTRATE 1 MG/ML IV SOLN
2.5000 mg | Freq: Four times a day (QID) | INTRAVENOUS | Status: DC
  Administered 2012-04-07 – 2012-04-08 (×4): 2.5 mg via INTRAVENOUS
  Filled 2012-04-07 (×7): qty 5

## 2012-04-07 MED ORDER — METOPROLOL TARTRATE 25 MG PO TABS
25.0000 mg | ORAL_TABLET | Freq: Two times a day (BID) | ORAL | Status: DC
Start: 2012-04-07 — End: 2012-04-07
  Filled 2012-04-07 (×2): qty 1

## 2012-04-07 NOTE — Procedures (Signed)
RIJV PICC 18 cm SVC RA

## 2012-04-07 NOTE — Progress Notes (Signed)
   CARE MANAGEMENT NOTE 04/07/2012  Patient:  [name], [name]   Account Number:  [id number]  Date Initiated:  04/07/2012  Documentation initiated by:  Jiles Crocker  Subjective/Objective Assessment:   ADMITTED WITH SYNCOPY     Action/Plan:   PCP: Kimber Relic, MD  PATIENT RESIDES IN A NURSING FACILITY; SOC WORKER REFERRAL PLACED   Anticipated DC Date:  04/14/2012   Anticipated DC Plan:  SKILLED NURSING FACILITY  In-house referral  Clinical Social Worker      DC Planning Services  CM consult            Status of service:  In process, will continue to follow Medicare Important Message given?  NA - LOS <3 / Initial given by admissions (If response is "NO", the following Medicare IM given date fields will be blank)  Per UR Regulation:  Reviewed for med. necessity/level of care/duration of stay  Comments:  04/07/2012- B Katoria Yetman RN,BSN,MHA

## 2012-04-07 NOTE — Progress Notes (Signed)
[facility] PROGRESS NOTE  AMEN DARGIS ZOX:[id number] DOB: 1918-08-29 DOA: 04/06/2012 PCP: Kimber Relic, MD  Assessment/Plan: 1-Questionable Syncope/ AMS:  Admit to telemetry,  cardiac enzymes are negative , EKG slightly abnormal on admission, will repeat one today. , CT head no acute intracranial abnormalities.   2-AMS/ metabolic Encephalopathy: In setting dehydration, mild uremia, metabolic abnormalities. IV fluids. Work up for infection pending. CXR and UA negative for infection. [place] abd doesn't show any acute abnormality. Blood cultures are pending.   3-Pancreatitis: Repeat lipase in am shows an increase from admission.  Will consider repeat CT abdomen. She had CT abdomen/pelvis 12-16 that show The pancreatic duct is somewhat more prominent than noted on CT of 2012, of questionable significance. No definite mass is seen. Mild increase LFT which are trending down., US abdomen doesn't show any acute abnormalities. Currently she is NPO with pain medications and IV fluids.   4-Acute on chronic Renal Failure: Last cr per records 0.7 2012. Likely pre renal and volume depletion, ibuprofen use, ACE. Hold nephrotoxic medications and [place] ABD does not show hydronephrosis. IV hydration with normal saline and will obtain urine electrolytes.  Urine shows hyaline casts.   5-Leukocytosis: Work up for infection negative.  Probably related to mild pancreatitis. Will get pro calcitonin levels.   6. Anemia : Her baseline H&H seems to be around 9 to 10. Her H&H is 12 .9 today probably from dehydration,. Will continue to monitor h&h.  Her stool for occult blood is positive, probably from extensive diverticulosis evident from last CT abdomen and plevis.   7. Atrial fibrillation: in sinus now on metoprolol and lanoxin.   8. H/o CVA in th past: on aspirin.    Code Status: DNR Family Communication: NONE AT BEDSIDE Disposition Plan: when medically stable in 2 to 3 days.    Consultants:  IR    Procedures: Pending IJ placement.  HPI/Subjective: Has low urine output overnight.   Objective: Filed Vitals:   04/06/12 2121 04/07/12 0233 04/07/12 0508 04/07/12 0700  BP: 148/59 168/59 148/60   Pulse: 72 74 68   Temp: 97.7 F (36.5 C) 97.7 F (36.5 C) 97.7 F (36.5 C)   TempSrc: Axillary Axillary Axillary   Resp: 20 18 18    Height:      Weight:    89.2 kg (196 lb 10.4 oz)  SpO2: 98% 95% 100%     Intake/Output Summary (Last 24 hours) at 04/07/12 1117 Last data filed at 04/07/12 8119  Gross per 24 hour  Intake   1200 ml  Output    200 ml  Net   1000 ml   Filed Weights   04/06/12 1724 04/07/12 0700  Weight: 87 kg (191 lb 12.8 oz) 89.2 kg (196 lb 10.4 oz)    Exam:   General:  Alert afebrile comfortable  Cardiovascular: s1s2 RRR, NO RMG  Respiratory: decreased at bases no wheezing or rhonchi  Abdomen: soft diffusely tender, NT BS+  Data Reviewed: Basic Metabolic Panel:  Lab 04/07/12 1478 04/06/12 2210 04/06/12 1448  NA 137 -- 132*  K 4.4 -- 4.6  CL 101 -- 95*  CO2 25 -- 24  GLUCOSE 125* -- 95  BUN 69* -- 64*  CREATININE 2.08* 2.05* 1.65*  CALCIUM 8.7 -- 9.2  MG -- -- --  PHOS -- -- --   Liver Function Tests:  Lab 04/07/12 0513 04/06/12 1448  AST 43* 64*  ALT 29 36*  ALKPHOS 172* 183*  BILITOT 0.4 0.5  PROT  5.9* 6.3  ALBUMIN 2.9* 3.1*    Lab 04/07/12 0513 04/06/12 1448  LIPASE 253* 203*  AMYLASE -- --   No results found for this basename: AMMONIA:5 in the last 168 hours CBC:  Lab 04/07/12 0513 04/06/12 2210 04/06/12 1225  WBC 12.7* 15.1* 13.2*  NEUTROABS -- -- 11.0*  HGB 12.9 13.0 15.8*  HCT 37.4 37.8 46.7*  MCV 87.4 87.7 91.4  PLT 243 268 235   Cardiac Enzymes:  Lab 04/07/12 0513 04/06/12 2210 04/06/12 1448  CKTOTAL -- -- --  CKMB -- -- --  CKMBINDEX -- -- --  TROPONINI <0.30 <0.30 <0.30   BNP (last 3 results) No results found for this basename: PROBNP:3 in the last 8760 hours CBG: No results found for this basename:  GLUCAP:5 in the last 168 hours  Recent Results (from the past 240 hour(s))  MRSA PCR SCREENING     Status: Normal   Collection Time   04/06/12  5:51 PM      Component Value Range Status Comment   MRSA by PCR NEGATIVE  NEGATIVE Final      Studies: Ct Head Wo Contrast  04/06/2012  *RADIOLOGY REPORT*  Clinical Data: Unresponsive.  Altered mental status.  CT HEAD WITHOUT CONTRAST  Technique:  Contiguous axial images were obtained from the base of the skull through the vertex without contrast.  Comparison: 04/15/2011 CT. 11/30/2010 CT.  11/16/2010 MR.  Findings: No intracranial hemorrhage.  Prominent small vessel disease type changes and remote right caudate head infarct without CT evidence of large acute infarct. Density of the basilar artery similar to prior exams.  No intracranial mass lesion detected on this unenhanced exam.  Global atrophy without hydrocephalus.  Vascular calcifications.  IMPRESSION: No intracranial hemorrhage.  Small vessel disease type changes and old right caudate infarct without CT evidence of large acute infarct.   Original Report Authenticated By: Lacy Duverney, M.D.    US Abdomen Complete  04/07/2012  *RADIOLOGY REPORT*  Clinical Data:  Elevated LFTs, increased lipase  COMPLETE ABDOMINAL ULTRASOUND  Comparison:  CT abdomen pelvis dated 03/30/2012  Findings:  Gallbladder:  No gallstones, gallbladder wall thickening, or pericholecystic fluid.  Negative sonographic Murphy's sign.  Common bile duct:  Poorly visualized.  Measures 3.5 mm.  Liver:  1.8 x 1.2 x 1.8 cm cyst in the left hepatic lobe. Heterogeneous hepatic parenchyma with multiple scattered hyperechoic foci.  IVC:  Appears normal.  Pancreas:  Poorly visualized due to overlying bowel gas.  Spleen:  Measures 6.5 cm.  Right Kidney:  Measures 10.3 cm.  Echogenic renal parenchyma, likely reflecting medical renal disease.  No mass or hydronephrosis.  Left Kidney:  Measures 9.8 cm.  Echogenic renal parenchyma, likely reflecting  medical renal disease.  No mass or hydronephrosis.  Abdominal aorta:  No aneurysm identified.  Atherosclerosis.  IMPRESSION: Pancreas is poorly visualized due to overlying bowel gas.  1.8 cm left hepatic lobe cyst.  Mildly heterogeneous hepatic parenchyma, nonspecific.  Echogenic renal parenchyma, suggesting medical renal disease.  No hydronephrosis.   Original Report Authenticated By: Charline Bills, M.D.    Dg Abd Acute W/chest  04/06/2012  *RADIOLOGY REPORT*  Clinical Data: Abdominal pain  ACUTE ABDOMEN SERIES (ABDOMEN 2 VIEW & CHEST 1 VIEW)  Comparison: CT 04/01/2012  Findings: Lungs are clear.  Heart size upper limits normal. Ectatic thoracic aorta.  No effusion.  No free air.  Extensive aortoiliac arterial calcifications.  Paucity of bowel gas. Residual oral contrast in the distal colon, opacifying multiple diverticula.  Spondylitic changes in the lumbar spine.  IMPRESSION:  1.  Nonobstructive bowel gas pattern. 2.  No free air. 3.  Sigmoid diverticula. 4.  No acute cardiopulmonary disease.   Original Report Authenticated By: D. Andria Rhein, MD     Scheduled Meds:   . acetaminophen  650 mg Rectal BID  . digoxin  0.0625 mg Intravenous Daily  . enoxaparin (LOVENOX) injection  30 mg Subcutaneous Q24H  . metoprolol  2.5 mg Intravenous Q6H  . pantoprazole (PROTONIX) IV  40 mg Intravenous Q12H  . sodium chloride  3 mL Intravenous Q12H   Continuous Infusions:   . sodium chloride 150 mL/hr at 04/07/12 1054    Active Problems:  Altered mental status  Pancreatitis  Acute on chronic renal failure  Encephalopathy       [facility]  [facility] Pager [phone]. If 8PM-8AM, please contact night-coverage at [email or web address], password [facility] 04/07/2012, 11:17 AM  LOS: 1 day

## 2012-04-07 NOTE — Progress Notes (Signed)
Pt had 1.68 pause and 1.73 pause on heart monitor, baseline rhythm NSR HR 70's. VSS. Pt resting, asymptomatic. NP notified via text page.  Will continue to monitor. Newman Nip [place]

## 2012-04-07 NOTE — Progress Notes (Signed)
INITIAL NUTRITION ASSESSMENT  DOCUMENTATION CODES Per approved criteria  -Obesity Unspecified   INTERVENTION: - Diet advancement per MD - RD to monitor plan of care  NUTRITION DIAGNOSIS: Inability to eat related to inability to eat as evidenced by NPO.   Goal: Advance diet as tolerated to regular diet  Monitor:  Weights, labs, diet advancement  Reason for Assessment: Nutrition risk   78 y.o. female  Admitting Dx: Altered mental status  ASSESSMENT: Pt asleep during visit, caregiver present at bedside. She reports pt has not eaten anything in the past 2 days. She reports even before then she was just consuming small amounts of food throughout the day and has tried giving her Ensure but she will only take small sips. She reports pt has not had an appetite in the past 2-3 weeks. She denies that pt has any problems chewing/swallowing. Caregiver not sure of pt's usual weight.   Height: Ht Readings from Last 1 Encounters:  04/06/12 5\' 5"  (1.651 m)    Weight: Wt Readings from Last 1 Encounters:  04/07/12 196 lb 10.4 oz (89.2 kg)    Ideal Body Weight: 125 lb  % Ideal Body Weight: 157  Wt Readings from Last 10 Encounters:  04/07/12 196 lb 10.4 oz (89.2 kg)    Usual Body Weight: Unable to assess  % Usual Body Weight: Unable to assess  BMI:  Body mass index is 32.72 kg/(m^2). Class I obesity  Estimated Nutritional Needs: Kcal: 1400-1600 Protein: 55-70g Fluid: > 1.6L  Skin: Stage 1 pressure ulcer on buttocks  Diet Order: NPO  EDUCATION NEEDS: -No education needs identified at this time   Intake/Output Summary (Last 24 hours) at 04/07/12 1106 Last data filed at 04/07/12 0728  Gross per 24 hour  Intake   1200 ml  Output    200 ml  Net   1000 ml    Last BM: 12/24  Labs:   Lab 04/07/12 0513 04/06/12 2210 04/06/12 1448  NA 137 -- 132*  K 4.4 -- 4.6  CL 101 -- 95*  CO2 25 -- 24  BUN 69* -- 64*  CREATININE 2.08* 2.05* 1.65*  CALCIUM 8.7 -- 9.2  MG --  -- --  PHOS -- -- --  GLUCOSE 125* -- 95    CBG (last 3)  No results found for this basename: GLUCAP:3 in the last 72 hours  Scheduled Meds:   . acetaminophen  650 mg Rectal BID  . digoxin  0.0625 mg Oral q morning - 10a  . enoxaparin (LOVENOX) injection  30 mg Subcutaneous Q24H  . metoprolol  25 mg Oral BID  . pantoprazole (PROTONIX) IV  40 mg Intravenous Q12H  . sodium chloride  3 mL Intravenous Q12H    Continuous Infusions:   . sodium chloride 150 mL/hr at 04/07/12 1054    Past Medical History  Diagnosis Date  . Hypertension   . Hypothyroidism   . Atrial fibrillation   . Hyponatremia     hx of  . Syncope     hx syncope and collapse per paper from [facility]  . Unconscious 04/06/12    per [facility] home paper -hx unconscious episodes -pt asked not to be sent to hospital for these  . Fx lumbar vertebra-closed     hx of [facility]  . Lower back pain     hx of    History reviewed. No pertinent past surgical history.   Levon Hedger MS, RD, LDN [phone] Pager [phone] After  Hours Pager

## 2012-04-08 LAB — LIPASE, BLOOD: Lipase: 230 U/L — ABNORMAL HIGH (ref 11–59)

## 2012-04-08 LAB — URINE CULTURE
Colony Count: NO GROWTH
Culture: NO GROWTH

## 2012-04-08 LAB — BASIC METABOLIC PANEL
Calcium: 8.2 mg/dL — ABNORMAL LOW (ref 8.4–10.5)
Creatinine, Ser: 0.97 mg/dL (ref 0.50–1.10)
GFR calc Af Amer: 57 mL/min — ABNORMAL LOW (ref >90)

## 2012-04-08 MED ORDER — HYDRALAZINE HCL 25 MG PO TABS
25.0000 mg | ORAL_TABLET | Freq: Three times a day (TID) | ORAL | Status: DC
  Administered 2012-04-08 – 2012-04-11 (×8): 25 mg via ORAL
  Filled 2012-04-08 (×11): qty 1

## 2012-04-08 MED ORDER — POTASSIUM CHLORIDE 20 MEQ/15ML (10%) PO LIQD
40.0000 meq | Freq: Once | ORAL | Status: AC
  Administered 2012-04-08: 40 meq via ORAL
  Filled 2012-04-08: qty 30

## 2012-04-08 MED ORDER — HYDRALAZINE HCL 20 MG/ML IJ SOLN
10.0000 mg | Freq: Once | INTRAMUSCULAR | Status: AC
  Administered 2012-04-08: 10 mg via INTRAVENOUS
  Filled 2012-04-08: qty 1

## 2012-04-08 MED ORDER — METOPROLOL TARTRATE 25 MG PO TABS
25.0000 mg | ORAL_TABLET | Freq: Two times a day (BID) | ORAL | Status: DC
  Administered 2012-04-08 – 2012-04-09 (×3): 25 mg via ORAL
  Filled 2012-04-08 (×5): qty 1

## 2012-04-08 MED ORDER — ENOXAPARIN SODIUM 40 MG/0.4ML ~~LOC~~ SOLN
40.0000 mg | SUBCUTANEOUS | Status: DC
  Administered 2012-04-08 – 2012-04-09 (×2): 40 mg via SUBCUTANEOUS
  Filled 2012-04-08 (×3): qty 0.4

## 2012-04-08 MED ORDER — HYDRALAZINE HCL 20 MG/ML IJ SOLN
10.0000 mg | Freq: Three times a day (TID) | INTRAMUSCULAR | Status: DC | PRN
  Administered 2012-04-08: 10 mg via INTRAVENOUS
  Filled 2012-04-08: qty 1

## 2012-04-08 MED ORDER — SODIUM CHLORIDE 0.9 % IJ SOLN
10.0000 mL | INTRAMUSCULAR | Status: DC | PRN
  Administered 2012-04-08 – 2012-04-09 (×3): 10 mL
  Administered 2012-04-10: 20 mL
  Administered 2012-04-10: 10 mL
  Administered 2012-04-11: 20 mL

## 2012-04-08 MED ORDER — HYDRALAZINE HCL 25 MG PO TABS: 25.0000 mg | ORAL_TABLET | Freq: Three times a day (TID) | ORAL | Status: DC

## 2012-04-08 MED ORDER — HYDRALAZINE HCL 20 MG/ML IJ SOLN: 10.0000 mg | Freq: Four times a day (QID) | INTRAMUSCULAR | Status: DC | PRN

## 2012-04-08 MED ORDER — SODIUM CHLORIDE 0.9 % IJ SOLN
10.0000 mL | Freq: Two times a day (BID) | INTRAMUSCULAR | Status: DC
  Administered 2012-04-11: 10 mL

## 2012-04-08 MED ORDER — AMLODIPINE BESYLATE 5 MG PO TABS
5.0000 mg | ORAL_TABLET | Freq: Every day | ORAL | Status: DC
  Administered 2012-04-08 – 2012-04-11 (×4): 5 mg via ORAL
  Filled 2012-04-08 (×4): qty 1

## 2012-04-08 NOTE — Progress Notes (Signed)
Patient saturated with urine twice during night shift.  Exchanged foley this am for a 16 french foley cath.  Genitale very swollen and difficult to place cath.  Tubing kept curling as if patient had a fistula.  After foley place urine still leaked around cath.  Cleaned patient and will continue to monitor patient.  Notified MD on call this am of patient's high blood pressure; 210/90 manual. Patient had no complaint's of pain or headache and has slept all night. New orders placed for Hydralazine.  Will continue to monitor patient.

## 2012-04-08 NOTE — Progress Notes (Signed)
[facility] PROGRESS NOTE  GENIYAH EISCHEID NWG:[id number] DOB: [birth year] DOA: 04/06/2012 PCP: Kimber Relic, MD  Assessment/Plan: 1-Questionable Syncope/ AMS:  Admit to telemetry,  cardiac enzymes are negative , EKG slightly abnormal on admission,, CT head no acute intracranial abnormalities.   2-AMS/ metabolic Encephalopathy: In setting dehydration, mild uremia, metabolic abnormalities. IV fluids. Work up for infection pending. CXR and UA negative for infection. [place] abd doesn't show any acute abnormality. Blood cultures are pending.   3-Pancreatitis: Repeat lipase in am shows an increase from admission.  Will consider repeat CT abdomen. She had CT abdomen/pelvis 12-16 that show The pancreatic duct is somewhat more prominent than noted on CT of 2012, of questionable significance. No definite mass is seen. Mild increase LFT which are trending down., US abdomen doesn't show any acute abnormalities. Repeat lipase levels ordered and if there is improvement wills tart her on clear liquid diet.  4-Acute on chronic Renal Failure: Last cr per records 0.7 2012. Likely pre renal and volume depletion, ibuprofen use, ACE. Hold nephrotoxic medications and [place] ABD does not show hydronephrosis. IV hydration with normal saline and will obtain urine electrolytes.  Urine shows hyaline casts.   5-Leukocytosis: Work up for infection negative.  Probably related to mild pancreatitis. Will get pro calcitonin levels.   6. Anemia : Her baseline H&H seems to be around 9 to 10. Her H&H is 12 .9 today probably from dehydration,. Will continue to monitor h&h.  Her stool for occult blood is positive, probably from extensive diverticulosis evident from last CT abdomen and plevis.   7. Atrial fibrillation: in sinus now on metoprolol and lanoxin.   8. H/o CVA in th past: on aspirin.    Code Status: DNR Family Communication: NONE AT BEDSIDE Disposition Plan: when medically stable in 2 to 3 days.     Consultants:  IR   Procedures: IJ placement.  HPI/Subjective: Has low urine output overnight.   Objective: Filed Vitals:   04/08/12 0500 04/08/12 0650 04/08/12 0703 04/08/12 0823  BP:  221/86 210/90 166/75  Pulse:  97  103  Temp:      TempSrc:      Resp:      Height:      Weight: 89.1 kg (196 lb 6.9 oz)     SpO2:        Intake/Output Summary (Last 24 hours) at 04/08/12 0925 Last data filed at 04/08/12 5784  Gross per 24 hour  Intake   3675 ml  Output    700 ml  Net   2975 ml   Filed Weights   04/06/12 1724 04/07/12 0700 04/08/12 0500  Weight: 87 kg (191 lb 12.8 oz) 89.2 kg (196 lb 10.4 oz) 89.1 kg (196 lb 6.9 oz)    Exam:   General:  Alert afebrile comfortable  Cardiovascular: s1s2 RRR, NO RMG  Respiratory: decreased at bases no wheezing or rhonchi  Abdomen: soft diffusely tender, NT BS+  Data Reviewed: Basic Metabolic Panel:  Lab 04/07/12 6962 04/06/12 2210 04/06/12 1448  NA 137 -- 132*  K 4.4 -- 4.6  CL 101 -- 95*  CO2 25 -- 24  GLUCOSE 125* -- 95  BUN 69* -- 64*  CREATININE 2.08* 2.05* 1.65*  CALCIUM 8.7 -- 9.2  MG -- -- --  PHOS -- -- --   Liver Function Tests:  Lab 04/07/12 0513 04/06/12 1448  AST 43* 64*  ALT 29 36*  ALKPHOS 172* 183*  BILITOT 0.4 0.5  PROT 5.9* 6.3  ALBUMIN 2.9* 3.1*    Lab 04/07/12 0513 04/06/12 1448  LIPASE 253* 203*  AMYLASE -- --   No results found for this basename: AMMONIA:5 in the last 168 hours CBC:  Lab 04/07/12 0513 04/06/12 2210 04/06/12 1225  WBC 12.7* 15.1* 13.2*  NEUTROABS -- -- 11.0*  HGB 12.9 13.0 15.8*  HCT 37.4 37.8 46.7*  MCV 87.4 87.7 91.4  PLT 243 268 235   Cardiac Enzymes:  Lab 04/07/12 0513 04/06/12 2210 04/06/12 1448  CKTOTAL -- -- --  CKMB -- -- --  CKMBINDEX -- -- --  TROPONINI <0.30 <0.30 <0.30   BNP (last 3 results) No results found for this basename: PROBNP:3 in the last 8760 hours CBG: No results found for this basename: GLUCAP:5 in the last 168 hours  Recent  Results (from the past 240 hour(s))  MRSA PCR SCREENING     Status: Normal   Collection Time   04/06/12  5:51 PM      Component Value Range Status Comment   MRSA by PCR NEGATIVE  NEGATIVE Final   CULTURE, BLOOD (ROUTINE X 2)     Status: Normal (Preliminary result)   Collection Time   04/06/12 10:10 PM      Component Value Range Status Comment   Specimen Description BLOOD LEFT ARM   Final    Special Requests BOTTLES DRAWN AEROBIC AND ANAEROBIC 1.5CC   Final    Culture  Setup Time 04/07/2012 01:21   Final    Culture     Final    Value:        BLOOD CULTURE RECEIVED NO GROWTH TO DATE CULTURE WILL BE HELD FOR 5 DAYS BEFORE ISSUING A FINAL NEGATIVE REPORT   Report Status PENDING   Incomplete   CULTURE, BLOOD (ROUTINE X 2)     Status: Normal (Preliminary result)   Collection Time   04/06/12 10:20 PM      Component Value Range Status Comment   Specimen Description BLOOD RIGHT HAND   Final    Special Requests BOTTLES DRAWN AEROBIC ONLY 1CC   Final    Culture  Setup Time 04/07/2012 01:21   Final    Culture     Final    Value:        BLOOD CULTURE RECEIVED NO GROWTH TO DATE CULTURE WILL BE HELD FOR 5 DAYS BEFORE ISSUING A FINAL NEGATIVE REPORT   Report Status PENDING   Incomplete      Studies: Ct Abdomen Pelvis Wo Contrast  04/07/2012  *RADIOLOGY REPORT*  Clinical Data: Pancreatitis with rising lipase.  CT ABDOMEN AND PELVIS WITHOUT CONTRAST  Technique:  Multidetector CT imaging of the abdomen and pelvis was performed following the standard protocol without intravenous contrast.  Comparison: 04/06/2012 ultrasound.  03/30/2012 CT.  Findings: Motion degraded exam.  Previously noted pancreatic duct prominence not appreciated on the current noncontrast motion degraded examination.  No obvious surrounding inflammation.  Evaluation of portions of bowel limited secondary to under distension.  This particularly limits evaluation of the colon and stomach.  Prominent sigmoid diverticula with muscular  hypertrophy.  Evaluation of solid abdominal viscera is limited by lack of IV contrast.  Taking this limitation into account no worrisome hepatic, splenic, renal or adrenal masses noted.  Prominent atherosclerotic type changes. Coronary artery calcification.  Calcification of the aorta with aneurysmal dilation and prominent ectasia unchanged. Calcified narrowed and ectatic common iliac arteries. Aortic branch vessel calcification.  Acute/subacute deformity L1 once again noted.  Remote compression fractures/Schmorl's node  deformities without change.  Foley catheter in place with decompressed bladder.  Cardiomegaly.  Minimal atelectatic changes dependent aspect of the lungs.  IMPRESSION: No inflammation seen surrounding the pancreas although evaluation limited by motion, artifact from overlying arms and lack of contrast.  Evaluation of portions of bowel limited secondary to under distension.  This particularly limits evaluation of the colon and stomach.  Prominent sigmoid diverticula with muscular hypertrophy.  Prominent atherosclerotic type changes aorta, aortic branch vessels and coronary arteries.  No significant change acute/subacute L1 compression fracture.  Mild retropulsion posterior-superior aspect.   Original Report Authenticated By: Lacy Duverney, M.D.    Ct Head Wo Contrast  04/06/2012  *RADIOLOGY REPORT*  Clinical Data: Unresponsive.  Altered mental status.  CT HEAD WITHOUT CONTRAST  Technique:  Contiguous axial images were obtained from the base of the skull through the vertex without contrast.  Comparison: 04/15/2011 CT. 11/30/2010 CT.  11/16/2010 MR.  Findings: No intracranial hemorrhage.  Prominent small vessel disease type changes and remote right caudate head infarct without CT evidence of large acute infarct. Density of the basilar artery similar to prior exams.  No intracranial mass lesion detected on this unenhanced exam.  Global atrophy without hydrocephalus.  Vascular calcifications.   IMPRESSION: No intracranial hemorrhage.  Small vessel disease type changes and old right caudate infarct without CT evidence of large acute infarct.   Original Report Authenticated By: Lacy Duverney, M.D.    US Abdomen Complete  04/07/2012  *RADIOLOGY REPORT*  Clinical Data:  Elevated LFTs, increased lipase  COMPLETE ABDOMINAL ULTRASOUND  Comparison:  CT abdomen pelvis dated 03/30/2012  Findings:  Gallbladder:  No gallstones, gallbladder wall thickening, or pericholecystic fluid.  Negative sonographic Murphy's sign.  Common bile duct:  Poorly visualized.  Measures 3.5 mm.  Liver:  1.8 x 1.2 x 1.8 cm cyst in the left hepatic lobe. Heterogeneous hepatic parenchyma with multiple scattered hyperechoic foci.  IVC:  Appears normal.  Pancreas:  Poorly visualized due to overlying bowel gas.  Spleen:  Measures 6.5 cm.  Right Kidney:  Measures 10.3 cm.  Echogenic renal parenchyma, likely reflecting medical renal disease.  No mass or hydronephrosis.  Left Kidney:  Measures 9.8 cm.  Echogenic renal parenchyma, likely reflecting medical renal disease.  No mass or hydronephrosis.  Abdominal aorta:  No aneurysm identified.  Atherosclerosis.  IMPRESSION: Pancreas is poorly visualized due to overlying bowel gas.  1.8 cm left hepatic lobe cyst.  Mildly heterogeneous hepatic parenchyma, nonspecific.  Echogenic renal parenchyma, suggesting medical renal disease.  No hydronephrosis.   Original Report Authenticated By: Charline Bills, M.D.    Ir Fluoro Guide Cv Line Right  04/07/2012  *RADIOLOGY REPORT*  Clinical Data: Infection.  Atrial fibrillation.  PICC LINE PLACEMENT WITH ULTRASOUND AND FLUOROSCOPIC  GUIDANCE  Fluoroscopy Time: 0.5 minutes.  The right neck was prepped with chlorhexidine, draped in the usual sterile fashion using maximum barrier technique (cap and mask, sterile gown, sterile gloves, large sterile sheet, hand hygiene and cutaneous antisepsis) and infiltrated locally with 1% Lidocaine.  Ultrasound demonstrated  patency of the right internal jugular vein, and this was documented with an image.  Under real-time ultrasound guidance, this vein was accessed with a 21 gauge micropuncture needle and image documentation was performed.  The needle was exchanged over a guidewire for a peel-away sheath through which a five [place] double lumen PICC trimmed to 18 cm was advanced, positioned with its tip at the lower SVC/right atrial junction. Fluoroscopy during the procedure and fluoro spot radiograph confirms  appropriate catheter position.  The catheter was flushed, secured to the skin with Prolene sutures, and covered with a sterile dressing.  Complications:  None  IMPRESSION: Successful right internal jugular PICC line placement with ultrasound and fluoroscopic guidance.  The catheter is ready for use.   Original Report Authenticated By: Jolaine Click, M.D.    Ir US Guide Vasc Access Right  04/07/2012  *RADIOLOGY REPORT*  Clinical Data: Infection.  Atrial fibrillation.  PICC LINE PLACEMENT WITH ULTRASOUND AND FLUOROSCOPIC  GUIDANCE  Fluoroscopy Time: 0.5 minutes.  The right neck was prepped with chlorhexidine, draped in the usual sterile fashion using maximum barrier technique (cap and mask, sterile gown, sterile gloves, large sterile sheet, hand hygiene and cutaneous antisepsis) and infiltrated locally with 1% Lidocaine.  Ultrasound demonstrated patency of the right internal jugular vein, and this was documented with an image.  Under real-time ultrasound guidance, this vein was accessed with a 21 gauge micropuncture needle and image documentation was performed.  The needle was exchanged over a guidewire for a peel-away sheath through which a five [place] double lumen PICC trimmed to 18 cm was advanced, positioned with its tip at the lower SVC/right atrial junction. Fluoroscopy during the procedure and fluoro spot radiograph confirms appropriate catheter position.  The catheter was flushed, secured to the skin with Prolene sutures,  and covered with a sterile dressing.  Complications:  None  IMPRESSION: Successful right internal jugular PICC line placement with ultrasound and fluoroscopic guidance.  The catheter is ready for use.   Original Report Authenticated By: Jolaine Click, M.D.    Dg Abd Acute W/chest  04/06/2012  *RADIOLOGY REPORT*  Clinical Data: Abdominal pain  ACUTE ABDOMEN SERIES (ABDOMEN 2 VIEW & CHEST 1 VIEW)  Comparison: CT 04/01/2012  Findings: Lungs are clear.  Heart size upper limits normal. Ectatic thoracic aorta.  No effusion.  No free air.  Extensive aortoiliac arterial calcifications.  Paucity of bowel gas. Residual oral contrast in the distal colon, opacifying multiple diverticula.  Spondylitic changes in the lumbar spine.  IMPRESSION:  1.  Nonobstructive bowel gas pattern. 2.  No free air. 3.  Sigmoid diverticula. 4.  No acute cardiopulmonary disease.   Original Report Authenticated By: D. Andria Rhein, MD     Scheduled Meds:    . acetaminophen  650 mg Rectal BID  . digoxin  0.0625 mg Intravenous Daily  . enoxaparin (LOVENOX) injection  30 mg Subcutaneous Q24H  . metoprolol  2.5 mg Intravenous Q6H  . pantoprazole (PROTONIX) IV  40 mg Intravenous Q12H  . sodium chloride  10-40 mL Intracatheter Q12H  . sodium chloride  3 mL Intravenous Q12H   Continuous Infusions:    . sodium chloride 150 mL/hr at 04/08/12 0415    Active Problems:  Altered mental status  Pancreatitis  Acute on chronic renal failure  Encephalopathy       [facility]  [facility] Pager [phone]. If 8PM-8AM, please contact night-coverage at [email or web address], password [facility] 04/08/2012, 9:25 AM  LOS: 2 days

## 2012-04-09 LAB — BASIC METABOLIC PANEL
BUN: 22 mg/dL (ref 6–23)
Chloride: 102 mEq/L (ref 96–112)
Creatinine, Ser: 0.7 mg/dL (ref 0.50–1.10)
GFR calc Af Amer: 85 mL/min — ABNORMAL LOW (ref >90)

## 2012-04-09 LAB — CBC
MCH: 30.4 pg (ref 26.0–34.0)
MCHC: 34.3 g/dL (ref 30.0–36.0)
Platelets: 266 10*3/uL (ref 150–400)

## 2012-04-09 LAB — MAGNESIUM: Magnesium: 1.2 mg/dL — ABNORMAL LOW (ref 1.5–2.5)

## 2012-04-09 MED ORDER — LORAZEPAM 0.5 MG PO TABS
0.5000 mg | ORAL_TABLET | Freq: Two times a day (BID) | ORAL | Status: DC
  Administered 2012-04-09 – 2012-04-11 (×5): 0.5 mg via ORAL
  Filled 2012-04-09 (×5): qty 1

## 2012-04-09 MED ORDER — MAGNESIUM SULFATE 40 MG/ML IJ SOLN
2.0000 g | Freq: Once | INTRAMUSCULAR | Status: AC
  Administered 2012-04-09: 2 g via INTRAVENOUS
  Filled 2012-04-09: qty 50

## 2012-04-09 MED ORDER — PANTOPRAZOLE SODIUM 40 MG PO TBEC
40.0000 mg | DELAYED_RELEASE_TABLET | Freq: Every day | ORAL | Status: DC
  Administered 2012-04-10 – 2012-04-11 (×2): 40 mg via ORAL
  Filled 2012-04-09 (×3): qty 1

## 2012-04-09 MED ORDER — POTASSIUM CHLORIDE 10 MEQ/100ML IV SOLN
10.0000 meq | INTRAVENOUS | Status: AC
  Administered 2012-04-09 (×4): 10 meq via INTRAVENOUS
  Filled 2012-04-09 (×4): qty 100

## 2012-04-09 MED ORDER — DIGOXIN 0.0625 MG HALF TABLET
0.0625 mg | ORAL_TABLET | Freq: Every day | ORAL | Status: DC
  Administered 2012-04-10 – 2012-04-11 (×2): 0.0625 mg via ORAL
  Filled 2012-04-09 (×3): qty 1

## 2012-04-09 NOTE — Progress Notes (Signed)
[facility] PROGRESS NOTE  LESTER PLATAS EXB:[id number] DOB: 01-31-1919 DOA: 04/06/2012 PCP: Kimber Relic, MD  Assessment/Plan: 1-Questionable Syncope/ AMS:  Admit to telemetry,  cardiac enzymes are negative , EKG slightly abnormal on admission,, CT head no acute intracranial abnormalities.   2-AMS/ metabolic Encephalopathy: In setting dehydration, mild uremia, metabolic abnormalities. IV fluids. Work up for infection pending. CXR and UA negative for infection. [place] abd doesn't show any acute abnormality. Blood cultures are pending.   3-Pancreatitis: Repeat lipase in am shows an increase from admission.  Will consider repeat CT abdomen. She had CT abdomen/pelvis 12-16 that show The pancreatic duct is somewhat more prominent than noted on CT of 2012, of questionable significance. No definite mass is seen. Mild increase LFT which are trending down., US abdomen doesn't show any acute abnormalities. Repeat lipase levels show an improvement , and we have started her on clear liquid diet, we will advance her diet as tolerated.  4-Acute on chronic Renal Failure: resolved. Last cr per records 0.7 2012. Likely pre renal and volume depletion, ibuprofen use, ACE. Hold nephrotoxic medications and [place] ABD does not show hydronephrosis. IV hydration with normal saline and will obtain urine electrolytes.  Urine shows hyaline casts.   5-Leukocytosis: Work up for infection negative.  Probably related to mild pancreatitis.   6. Anemia : Her baseline H&H seems to be around 9 to 10. Her H&H is 12 .9 today probably from dehydration,. Will continue to monitor h&h.  Her stool for occult blood is positive, probably from extensive diverticulosis evident from last CT abdomen and plevis.   7. Atrial fibrillation: in sinus now on metoprolol and lanoxin.   8. H/o CVA in th past: on aspirin.   9. Hypokalemia: replete as needed. Magnesium levels is low. Will be repleted as needed.   Code Status: DNR Family  Communication: daughter at bedside Disposition Plan: when medically stable in 2 to 3 days.    Consultants:  IR   Procedures: IJ placement.  HPI/Subjective: Comfortable, communicating, and reports her abd pain is resolved.   Objective: Filed Vitals:   04/08/12 1802 04/08/12 2112 04/09/12 0452 04/09/12 1427  BP: 190/70 158/64 154/75 152/63  Pulse: 85 100 110 105  Temp:  98 F (36.7 C) 97.5 F (36.4 C) 97.8 F (36.6 C)  TempSrc:  Oral Oral Oral  Resp:  20 18 18   Height:      Weight:   92.8 kg (204 lb 9.4 oz)   SpO2:  97% 97% 98%    Intake/Output Summary (Last 24 hours) at 04/09/12 1546 Last data filed at 04/09/12 1427  Gross per 24 hour  Intake  892.5 ml  Output   2375 ml  Net -1482.5 ml   Filed Weights   04/07/12 0700 04/08/12 0500 04/09/12 0452  Weight: 89.2 kg (196 lb 10.4 oz) 89.1 kg (196 lb 6.9 oz) 92.8 kg (204 lb 9.4 oz)    Exam:   General:  Alert afebrile comfortable  Cardiovascular: s1s2 RRR, NO RMG  Respiratory: decreased at bases no wheezing or rhonchi  Abdomen: soft NT, [place] BS+  Data Reviewed: Basic Metabolic Panel:  Lab 04/09/12 1027 04/09/12 0500 04/08/12 0857 04/07/12 0513 04/06/12 2210 04/06/12 1448  NA -- 139 143 137 -- 132*  K -- 2.9* 3.3* 4.4 -- 4.6  CL -- 102 107 101 -- 95*  CO2 -- 23 23 25  -- 24  GLUCOSE -- 71 78 125* -- 95  BUN -- 22 40* 69* -- 64*  CREATININE -- 0.70 0.97 2.08* 2.05* 1.65*  CALCIUM -- 8.3* 8.2* 8.7 -- 9.2  MG 1.2* -- -- -- -- --  PHOS -- -- -- -- -- --   Liver Function Tests:  Lab 04/07/12 0513 04/06/12 1448  AST 43* 64*  ALT 29 36*  ALKPHOS 172* 183*  BILITOT 0.4 0.5  PROT 5.9* 6.3  ALBUMIN 2.9* 3.1*    Lab 04/09/12 1130 04/08/12 0910 04/07/12 0513 04/06/12 1448  LIPASE 88* 230* 253* 203*  AMYLASE -- -- -- --   No results found for this basename: AMMONIA:5 in the last 168 hours CBC:  Lab 04/09/12 1130 04/07/12 0513 04/06/12 2210 04/06/12 1225  WBC 10.6* 12.7* 15.1* 13.2*  NEUTROABS -- -- --  11.0*  HGB 11.4* 12.9 13.0 15.8*  HCT 33.2* 37.4 37.8 46.7*  MCV 88.5 87.4 87.7 91.4  PLT 266 243 268 235   Cardiac Enzymes:  Lab 04/07/12 0513 04/06/12 2210 04/06/12 1448  CKTOTAL -- -- --  CKMB -- -- --  CKMBINDEX -- -- --  TROPONINI <0.30 <0.30 <0.30   BNP (last 3 results) No results found for this basename: PROBNP:3 in the last 8760 hours CBG: No results found for this basename: GLUCAP:5 in the last 168 hours  Recent Results (from the past 240 hour(s))  MRSA PCR SCREENING     Status: Normal   Collection Time   04/06/12  5:51 PM      Component Value Range Status Comment   MRSA by PCR NEGATIVE  NEGATIVE Final   CULTURE, BLOOD (ROUTINE X 2)     Status: Normal (Preliminary result)   Collection Time   04/06/12 10:10 PM      Component Value Range Status Comment   Specimen Description BLOOD LEFT ARM   Final    Special Requests BOTTLES DRAWN AEROBIC AND ANAEROBIC 1.5CC   Final    Culture  Setup Time 04/07/2012 01:21   Final    Culture     Final    Value:        BLOOD CULTURE RECEIVED NO GROWTH TO DATE CULTURE WILL BE HELD FOR 5 DAYS BEFORE ISSUING A FINAL NEGATIVE REPORT   Report Status PENDING   Incomplete   CULTURE, BLOOD (ROUTINE X 2)     Status: Normal (Preliminary result)   Collection Time   04/06/12 10:20 PM      Component Value Range Status Comment   Specimen Description BLOOD RIGHT HAND   Final    Special Requests BOTTLES DRAWN AEROBIC ONLY 1CC   Final    Culture  Setup Time 04/07/2012 01:21   Final    Culture     Final    Value:        BLOOD CULTURE RECEIVED NO GROWTH TO DATE CULTURE WILL BE HELD FOR 5 DAYS BEFORE ISSUING A FINAL NEGATIVE REPORT   Report Status PENDING   Incomplete   URINE CULTURE     Status: Normal   Collection Time   04/07/12  6:55 AM      Component Value Range Status Comment   Specimen Description URINE, CATHETERIZED   Final    Special Requests NONE   Final    Culture  Setup Time 04/07/2012 13:11   Final    Colony Count NO GROWTH   Final      Culture NO GROWTH   Final    Report Status 04/08/2012 FINAL   Final      Studies: No results found.  Scheduled Meds:    .  amLODipine  5 mg Oral Daily  . digoxin  0.0625 mg Oral Daily  . enoxaparin (LOVENOX) injection  40 mg Subcutaneous Q24H  . hydrALAZINE  25 mg Oral Q8H  . LORazepam  0.5 mg Oral BID  . magnesium sulfate 1 - 4 g bolus IVPB  2 g Intravenous Once  . metoprolol tartrate  25 mg Oral BID  . pantoprazole  40 mg Oral Q0600  . potassium chloride  10 mEq Intravenous Q1 Hr x 4  . sodium chloride  10-40 mL Intracatheter Q12H  . sodium chloride  3 mL Intravenous Q12H   Continuous Infusions:    . sodium chloride 75 mL/hr at 04/08/12 2147    Active Problems:  Altered mental status  Pancreatitis  Acute on chronic renal failure  Encephalopathy       [facility]  [facility] Pager [phone]. If 8PM-8AM, please contact night-coverage at [email or web address], password [facility] 04/09/2012, 3:46 PM  LOS: 3 days

## 2012-04-09 NOTE — Progress Notes (Signed)
Called on-call physician 2 times to request order for banana bag for patient. She cannot/refuses all po meds.

## 2012-04-10 DIAGNOSIS — I10 Essential (primary) hypertension: Secondary | ICD-10-CM

## 2012-04-10 DIAGNOSIS — I359 Nonrheumatic aortic valve disorder, unspecified: Secondary | ICD-10-CM

## 2012-04-10 DIAGNOSIS — R9431 Abnormal electrocardiogram [ECG] [EKG]: Secondary | ICD-10-CM | POA: Diagnosis present

## 2012-04-10 LAB — BASIC METABOLIC PANEL
Calcium: 8.9 mg/dL (ref 8.4–10.5)
Creatinine, Ser: 0.62 mg/dL (ref 0.50–1.10)
GFR calc Af Amer: 89 mL/min — ABNORMAL LOW (ref >90)

## 2012-04-10 LAB — PROCALCITONIN: Procalcitonin: 0.11 ng/mL

## 2012-04-10 MED ORDER — ENOXAPARIN SODIUM 30 MG/0.3ML ~~LOC~~ SOLN
30.0000 mg | SUBCUTANEOUS | Status: DC
  Administered 2012-04-10: 30 mg via SUBCUTANEOUS
  Filled 2012-04-10 (×2): qty 0.3

## 2012-04-10 MED ORDER — METOPROLOL TARTRATE 50 MG PO TABS
75.0000 mg | ORAL_TABLET | Freq: Two times a day (BID) | ORAL | Status: DC
  Administered 2012-04-10 – 2012-04-11 (×2): 75 mg via ORAL
  Filled 2012-04-10 (×3): qty 1

## 2012-04-10 MED ORDER — METOPROLOL TARTRATE 50 MG PO TABS
50.0000 mg | ORAL_TABLET | Freq: Two times a day (BID) | ORAL | Status: DC
  Administered 2012-04-10: 50 mg via ORAL
  Filled 2012-04-10 (×2): qty 1

## 2012-04-10 NOTE — Progress Notes (Signed)
[facility] PROGRESS NOTE  AARINI SLEE WUJ:[id number] DOB: Sep 11, 1918 DOA: 04/06/2012 PCP: Kimber Relic, MD  Assessment/Plan: 1-Questionable Syncope/ AMS:  Admit to telemetry,  cardiac enzymes are negative , EKG slightly abnormal on admission,,. repeat EKG show st t wave abnormalities.she is chest pain free and echo ordered. Cardiology consulted for further recommendations  CT head no acute intracranial abnormalities.   2-AMS/ metabolic Encephalopathy: In setting dehydration, mild uremia, metabolic abnormalities. IV fluids. Work up for infection pending. CXR and UA negative for infection. [place] abd doesn't show any acute abnormality. Blood cultures are pending.   3-Pancreatitis: Repeat lipase in am shows an increase from admission.  Will consider repeat CT abdomen. She had CT abdomen/pelvis 12-16 that show The pancreatic duct is somewhat more prominent than noted on CT of 2012, of questionable significance. No definite mass is seen. Mild increase LFT which are trending down., US abdomen doesn't show any acute abnormalities. Repeat lipase levels show an improvement , and we have started her on clear liquid diet, we will advance her diet as tolerated.  4-Acute on chronic Renal Failure: resolved. Last cr per records 0.7 2012. Likely pre renal and volume depletion, ibuprofen use, ACE. Hold nephrotoxic medications and [place] ABD does not show hydronephrosis. IV hydration with normal saline and will obtain urine electrolytes.  Urine shows hyaline casts.   5-Leukocytosis: Work up for infection negative.  Probably related to mild pancreatitis.   6. Anemia : Her baseline H&H seems to be around 9 to 10. Her H&H is 12 .9 today probably from dehydration,. Will continue to monitor h&h.  Her stool for occult blood is positive, probably from extensive diverticulosis evident from last CT abdomen and plevis.   7. Atrial fibrillation: in sinus now on metoprolol and lanoxin.   8. H/o CVA in th past: on  aspirin.   9. Hypokalemia: replete as needed. Magnesium levels is low. Will be repleted as needed.   Code Status: DNR Family Communication: daughter at bedside Disposition Plan: when medically stable in 2 to 3 days.    Consultants:  IR   Procedures: IJ placement.  HPI/Subjective: Comfortable, communicating, and reports her abd pain is resolved.   Objective: Filed Vitals:   04/09/12 2128 04/09/12 2243 04/10/12 0437 04/10/12 0500  BP: 158/72 161/70 154/75   Pulse: 114 104 103   Temp: 97.4 F (36.3 C)  97.5 F (36.4 C)   TempSrc: Oral  Oral   Resp: 18  18   Height:      Weight:    40.7 kg (89 lb 11.6 oz)  SpO2: 97%  97%     Intake/Output Summary (Last 24 hours) at 04/10/12 1242 Last data filed at 04/10/12 0900  Gross per 24 hour  Intake    280 ml  Output   1525 ml  Net  -1245 ml   Filed Weights   04/08/12 0500 04/09/12 0452 04/10/12 0500  Weight: 89.1 kg (196 lb 6.9 oz) 92.8 kg (204 lb 9.4 oz) 40.7 kg (89 lb 11.6 oz)    Exam:   General:  Alert afebrile comfortable  Cardiovascular: s1s2 RRR, NO RMG  Respiratory: decreased at bases no wheezing or rhonchi  Abdomen: soft NT, ND BS+  Data Reviewed: Basic Metabolic Panel:  Lab 04/10/12 9562 04/09/12 1130 04/09/12 0500 04/08/12 0857 04/07/12 0513 04/06/12 2210 04/06/12 1448  NA 138 -- 139 143 137 -- 132*  K 3.5 -- 2.9* 3.3* 4.4 -- 4.6  CL 100 -- 102 107 101 -- 95*  CO2 24 -- 23 23 25  -- 24  GLUCOSE 76 -- 71 78 125* -- 95  BUN 17 -- 22 40* 69* -- 64*  CREATININE 0.62 -- 0.70 0.97 2.08* 2.05* --  CALCIUM 8.9 -- 8.3* 8.2* 8.7 -- 9.2  MG -- 1.2* -- -- -- -- --  PHOS -- -- -- -- -- -- --   Liver Function Tests:  Lab 04/07/12 0513 04/06/12 1448  AST 43* 64*  ALT 29 36*  ALKPHOS 172* 183*  BILITOT 0.4 0.5  PROT 5.9* 6.3  ALBUMIN 2.9* 3.1*    Lab 04/10/12 0455 04/09/12 1130 04/08/12 0910 04/07/12 0513 04/06/12 1448  LIPASE 126* 88* 230* 253* 203*  AMYLASE -- -- -- -- --   No results found for this  basename: AMMONIA:5 in the last 168 hours CBC:  Lab 04/09/12 1130 04/07/12 0513 04/06/12 2210 04/06/12 1225  WBC 10.6* 12.7* 15.1* 13.2*  NEUTROABS -- -- -- 11.0*  HGB 11.4* 12.9 13.0 15.8*  HCT 33.2* 37.4 37.8 46.7*  MCV 88.5 87.4 87.7 91.4  PLT 266 243 268 235   Cardiac Enzymes:  Lab 04/07/12 0513 04/06/12 2210 04/06/12 1448  CKTOTAL -- -- --  CKMB -- -- --  CKMBINDEX -- -- --  TROPONINI <0.30 <0.30 <0.30   BNP (last 3 results) No results found for this basename: PROBNP:3 in the last 8760 hours CBG: No results found for this basename: GLUCAP:5 in the last 168 hours  Recent Results (from the past 240 hour(s))  MRSA PCR SCREENING     Status: Normal   Collection Time   04/06/12  5:51 PM      Component Value Range Status Comment   MRSA by PCR NEGATIVE  NEGATIVE Final   CULTURE, BLOOD (ROUTINE X 2)     Status: Normal (Preliminary result)   Collection Time   04/06/12 10:10 PM      Component Value Range Status Comment   Specimen Description BLOOD LEFT ARM   Final    Special Requests BOTTLES DRAWN AEROBIC AND ANAEROBIC 1.5CC   Final    Culture  Setup Time 04/07/2012 01:21   Final    Culture     Final    Value:        BLOOD CULTURE RECEIVED NO GROWTH TO DATE CULTURE WILL BE HELD FOR 5 DAYS BEFORE ISSUING A FINAL NEGATIVE REPORT   Report Status PENDING   Incomplete   CULTURE, BLOOD (ROUTINE X 2)     Status: Normal (Preliminary result)   Collection Time   04/06/12 10:20 PM      Component Value Range Status Comment   Specimen Description BLOOD RIGHT HAND   Final    Special Requests BOTTLES DRAWN AEROBIC ONLY 1CC   Final    Culture  Setup Time 04/07/2012 01:21   Final    Culture     Final    Value:        BLOOD CULTURE RECEIVED NO GROWTH TO DATE CULTURE WILL BE HELD FOR 5 DAYS BEFORE ISSUING A FINAL NEGATIVE REPORT   Report Status PENDING   Incomplete   URINE CULTURE     Status: Normal   Collection Time   04/07/12  6:55 AM      Component Value Range Status Comment    Specimen Description URINE, CATHETERIZED   Final    Special Requests NONE   Final    Culture  Setup Time 04/07/2012 13:11   Final    Colony Count NO GROWTH  Final    Culture NO GROWTH   Final    Report Status 04/08/2012 FINAL   Final      Studies: No results found.  Scheduled Meds:    . amLODipine  5 mg Oral Daily  . digoxin  0.0625 mg Oral Daily  . enoxaparin (LOVENOX) injection  30 mg Subcutaneous Q24H  . hydrALAZINE  25 mg Oral Q8H  . LORazepam  0.5 mg Oral BID  . metoprolol tartrate  50 mg Oral BID  . pantoprazole  40 mg Oral Q0600  . sodium chloride  10-40 mL Intracatheter Q12H  . sodium chloride  3 mL Intravenous Q12H   Continuous Infusions:    Active Problems:  Altered mental status  Pancreatitis  Acute on chronic renal failure  Encephalopathy       Jamee Pacholski  [facility] Pager [phone]. If 8PM-8AM, please contact night-coverage at [email or web address], password [email or web address] 04/10/2012, 12:42 PM  LOS: 4 days

## 2012-04-10 NOTE — Progress Notes (Signed)
Patient set to discharge back to [facility] tomorrow if medically stable. CSW sent preliminary D/C Summary to facility. Weekend CSW, Marchelle Folks (cell#: [phone]) to facilitate discharge tomorrow. Weekend contact @ [facility] is Duwayne Heck (ph#: [phone] extension [phone] or [phone]) for discharge & report.   Unice Bailey, LCSW [facility] Clinical Social Worker cell #: [phone]

## 2012-04-10 NOTE — Progress Notes (Signed)
  Echocardiogram 2D Echocardiogram has been performed.  [name] 04/10/2012, 1:26 PM

## 2012-04-10 NOTE — Discharge Summary (Signed)
Physician Discharge Summary  [name] ZOX:[id number] DOB: [birth year] DOA: 04/06/2012  PCP: Kimber Relic, MD  Admit date: 04/06/2012 Discharge date: 04/11/2012   Discharge Diagnoses:  Active Problems:  Altered mental status  Pancreatitis  Acute on chronic renal failure  Encephalopathy  Essential hypertension  Abnormal ECG     Diet recommendation: low sodium diet  Filed Weights   04/09/12 0452 04/10/12 0500 04/11/12 0503  Weight: 92.8 kg (204 lb 9.4 oz) 40.7 kg (89 lb 11.6 oz) 39.69 kg (87 lb 8 oz)    History of present illness:  [name] is a 78 y.o. female was at orthopedic office for evaluation of back pain when patient wanted to lying down. Patient laid down and went unresponsive. History was obtain from ED records and care giver. Per care giver patient has had 2 episodes this past week where she become unresponsive. She has been mor weak and sleeping more during the days. Patient was transfer to SNF care from ALF due to deconditioning. On arrival she was found to be in metabolic encephalopathy, acute pancreatitis, and acute renal failure.    Hospital Course:   1-Questionable Syncope/ AMS:  Admit to telemetry, cardiac enzymes are negative , EKG slightly abnormal on admission,, repeat EKG show st t wave abnormalities.she is chest pain free and echo done showed good LVEF, and grade 1 diastolic dysfunction. Cardiology consulted, recommended repleting the electrolytes and increasing the dose of the metoprolol and restarting her antihypertensives.  CT head no acute intracranial abnormalities.  2-AMS/ metabolic Encephalopathy: In setting dehydration, mild uremia, metabolic abnormalities. IV fluids. Work up for infection negative. CXR and UA negative for infection. [place] abd doesn't show any acute abnormality. Blood cultures are negative so far.   3-Pancreatitis: Repeat lipase in am shows an increase from admission.  She had CT abdomen/pelvis 12-16 that show The  pancreatic duct is somewhat more prominent than noted on CT of 2012, of questionable significance. No definite mass is seen. Mild increase LFT which are trending down., US abdomen doesn't show any acute abnormalities. Repeat lipase levels show an improvement , and we have started her on clear liquid diet, we will advance her diet as tolerated. Her ct abd does not show any acute abnormalities.  4-Acute on chronic Renal Failure: resolved. Last cr per records 0.7 2012. Likely pre renal and volume depletion, ibuprofen use, ACE. Hold nephrotoxic medications and [place] ABD does not show hydronephrosis. IV hydration with normal saline and will obtain urine electrolytes. Urine shows hyaline casts.  5-Leukocytosis: Work up for infection negative. Probably related to mild pancreatitis.  6. Anemia : Her baseline H&H seems to be around 9 to 10. Her H&H is 12 .9 today probably from dehydration,.Her stool for occult blood is positive, probably from extensive diverticulosis evident from last CT abdomen and plevis. We will hold aspirin  7. Atrial fibrillation: in sinus now on metoprolol and lanoxin.  8. H/o CVA in th past: on aspirin at home which is being held for anemia and for positive blood in stool.  9. Hypokalemia and hypomagnesemia will be repleted before discharge. Recommend checking bmp in am to make sure her electrolytes are within normal limits.    Procedures:  IJ placement  Consultations:  IR  cardiology  Discharge Exam: Filed Vitals:   04/10/12 2204 04/10/12 2249 04/11/12 0503 04/11/12 0635  BP: 139/67  152/70 165/76  Pulse: 106 108 86   Temp: 97.5 F (36.4 C)  97.4 F (36.3 C)   TempSrc:  Oral  Oral   Resp: 18  18   Height:      Weight:   39.69 kg (87 lb 8 oz)   SpO2: 97%  98%    General: Alert afebrile comfortable  Cardiovascular: s1s2 RRR, NO RMG  Respiratory: decreased at bases no wheezing or rhonchi  Abdomen: soft NT, ND BS+    Discharge Instructions      Discharge Orders     Future Orders Please Complete By Expires   Diet - low sodium heart healthy      Discharge instructions      Comments:   Please get a BMP in am to make sure potassium is within normal limits.  Follow upw ith  PCP as needed.   Activity as tolerated - No restrictions          Medication List     As of 04/11/2012 11:09 AM    STOP taking these medications         aspirin EC 81 MG tablet      lisinopril 40 MG tablet   Commonly known as: PRINIVIL,ZESTRIL      MIDOL 200 MG Caps   Generic drug: Ibuprofen      TAKE these medications         acetaminophen 650 MG suppository   Commonly known as: TYLENOL   Place 650 mg rectally 2 (two) times daily. For pain and discomfort      [place] 450 MG Caps   Take 450 mg by mouth daily as needed. For constipation      cloNIDine 0.1 MG tablet   Commonly known as: CATAPRES   Take 1 tablet (0.1 mg total) by mouth daily.      demeclocycline 150 MG tablet   Commonly known as: DECLOMYCIN   Take 150 mg by mouth 2 (two) times daily.      digoxin 0.125 MG tablet   Commonly known as: LANOXIN   Take 0.0625 mg by mouth every morning.      hydrALAZINE 50 MG tablet   Commonly known as: APRESOLINE   Take 50 mg by mouth 2 (two) times daily.      LORazepam 0.5 MG tablet   Commonly known as: ATIVAN   Take 0.5 mg by mouth 2 (two) times daily.      magnesium hydroxide 400 MG/5ML suspension   Commonly known as: MILK OF MAGNESIA   Take 30 mLs by mouth daily as needed. For constipation      metoprolol tartrate 25 MG tablet   Commonly known as: LOPRESSOR   Take 3 tablets (75 mg total) by mouth 2 (two) times daily.      omeprazole 20 MG capsule   Commonly known as: PRILOSEC   Take 20 mg by mouth every morning.      potassium chloride 10 MEQ tablet   Commonly known as: K-DUR   Take 10 mEq by mouth 2 (two) times daily.      vitamin B-12 500 MCG tablet   Commonly known as: CYANOCOBALAMIN   Take 500 mcg by mouth every morning.       Vitamin D 2000 UNITS tablet   Take 2,000 Units by mouth every morning.            The results of significant diagnostics from this hospitalization (including imaging, microbiology, ancillary and laboratory) are listed below for reference.    Significant Diagnostic Studies: Ct Abdomen Pelvis Wo Contrast  04/07/2012  *RADIOLOGY REPORT*  Clinical Data: Pancreatitis  with rising lipase.  CT ABDOMEN AND PELVIS WITHOUT CONTRAST  Technique:  Multidetector CT imaging of the abdomen and pelvis was performed following the standard protocol without intravenous contrast.  Comparison: 04/06/2012 ultrasound.  03/30/2012 CT.  Findings: Motion degraded exam.  Previously noted pancreatic duct prominence not appreciated on the current noncontrast motion degraded examination.  No obvious surrounding inflammation.  Evaluation of portions of bowel limited secondary to under distension.  This particularly limits evaluation of the colon and stomach.  Prominent sigmoid diverticula with muscular hypertrophy.  Evaluation of solid abdominal viscera is limited by lack of IV contrast.  Taking this limitation into account no worrisome hepatic, splenic, renal or adrenal masses noted.  Prominent atherosclerotic type changes. Coronary artery calcification.  Calcification of the aorta with aneurysmal dilation and prominent ectasia unchanged. Calcified narrowed and ectatic common iliac arteries. Aortic branch vessel calcification.  Acute/subacute deformity L1 once again noted.  Remote compression fractures/Schmorl's node deformities without change.  Foley catheter in place with decompressed bladder.  Cardiomegaly.  Minimal atelectatic changes dependent aspect of the lungs.  IMPRESSION: No inflammation seen surrounding the pancreas although evaluation limited by motion, artifact from overlying arms and lack of contrast.  Evaluation of portions of bowel limited secondary to under distension.  This particularly limits evaluation of the  colon and stomach.  Prominent sigmoid diverticula with muscular hypertrophy.  Prominent atherosclerotic type changes aorta, aortic branch vessels and coronary arteries.  No significant change acute/subacute L1 compression fracture.  Mild retropulsion posterior-superior aspect.   Original Report Authenticated By: Lacy Duverney, M.D.    Ct Head Wo Contrast  04/06/2012  *RADIOLOGY REPORT*  Clinical Data: Unresponsive.  Altered mental status.  CT HEAD WITHOUT CONTRAST  Technique:  Contiguous axial images were obtained from the base of the skull through the vertex without contrast.  Comparison: 04/15/2011 CT. 11/30/2010 CT.  11/16/2010 MR.  Findings: No intracranial hemorrhage.  Prominent small vessel disease type changes and remote right caudate head infarct without CT evidence of large acute infarct. Density of the basilar artery similar to prior exams.  No intracranial mass lesion detected on this unenhanced exam.  Global atrophy without hydrocephalus.  Vascular calcifications.  IMPRESSION: No intracranial hemorrhage.  Small vessel disease type changes and old right caudate infarct without CT evidence of large acute infarct.   Original Report Authenticated By: Lacy Duverney, M.D.    Ct Lumbar Spine Wo Contrast  03/30/2012  *RADIOLOGY REPORT*  Clinical Data: Back pain.  No history of surgery.  Severe back pain.  CT LUMBAR SPINE WITHOUT CONTRAST  Technique:  Multidetector CT imaging of the lumbar spine was performed without intravenous contrast administration. Multiplanar CT image reconstructions were also generated.  Comparison: CT 01/31/2011.  Findings: Incidental imaging of the abdomen appears unchanged compared to prior exam, better evaluated on prior abdominal CT. There is a levoconvex lumbar scoliosis with the apex at L4-L5. Dextroconvex curvature apex is at L1-L2.  Chronic L3 compression fracture is present with 50% loss of maximal central vertebral body height.  Chronic L2 compression fractures present  with 25% loss vertebral body height.  Minimal retropulsion of both L2 and L3.  Both of these fractures were present on the prior CT 01/31/2011.  There has been interval development of an L1 compression fracture with sclerosis of the vertebral body.  This may represent acute on chronic fracture as sclerosis is typically associated with healing. The fracture planes remain crisp and visible.  Paravertebral phlegmon is present, also supporting acute/subacute injury.  There is  no gross epidural hematoma.  There is mild retropulsion, measuring about 5 mm.  Similar to the level below, T12 is partially visualized but appears to demonstrate superior endplate compression fracture which is also new compared to the prior CT.  Paravertebral phlegmon is present, compatible with acute or subacute injury.  T12-L1:  Mild central stenosis associated with retropulsion of L1. Partially calcified disc.  Central canal and foramina appear adequately patent.  L1-L2:  Partially calcified disc.  Shallow broad-based posterior disc bulging.  Central canal appears adequately patent.  Foramina patent.  L2-L3:  Mild central stenosis associated with facet hypertrophy, broad-based posterior disc bulging and minimal retropulsion of L3. Foramina patent.  L3-L4:  Moderate bilateral facet arthrosis.  Mild central stenosis associated with broad-based posterior disc bulging.  Disc is degenerated and partially calcified.  Foramina patent.  L4-L5:  Disc degeneration and calcification.  Disc collapse is asymmetrically worse on the right.  Moderate right foraminal stenosis associated with endplate spurring and bulging disc.  Mild to moderate bilateral facet arthrosis.  Calcification of the ligamentum flavum.  Central stenosis is mild.  L5-S1:  Relatively preserved disc height.  Mild facet arthrosis. Central canal and foramina appear adequately patent.  IMPRESSION: 1.  Acute or subacute L1 compression fracture with about 30% loss of vertebral body height and  mild retropulsion.  Very mild central stenosis associated with retropulsion. 2.  T12 is partially visualized and also demonstrates loss of vertebral body height with paravertebral phlegmon compatible with acute / subacute compression fracture.  Loss of height is estimated at 25% although incompletely visualized. 3.  Degenerative disease as described above, most pronounced at L4- L5. 4.  Chronic to L2 and L3 the superior endplate compression fractures are unchanged compared to CT 01/31/2011.   Original Report Authenticated By: Andreas Newport, M.D.    US Abdomen Complete  04/07/2012  *RADIOLOGY REPORT*  Clinical Data:  Elevated LFTs, increased lipase  COMPLETE ABDOMINAL ULTRASOUND  Comparison:  CT abdomen pelvis dated 03/30/2012  Findings:  Gallbladder:  No gallstones, gallbladder wall thickening, or pericholecystic fluid.  Negative sonographic Murphy's sign.  Common bile duct:  Poorly visualized.  Measures 3.5 mm.  Liver:  1.8 x 1.2 x 1.8 cm cyst in the left hepatic lobe. Heterogeneous hepatic parenchyma with multiple scattered hyperechoic foci.  IVC:  Appears normal.  Pancreas:  Poorly visualized due to overlying bowel gas.  Spleen:  Measures 6.5 cm.  Right Kidney:  Measures 10.3 cm.  Echogenic renal parenchyma, likely reflecting medical renal disease.  No mass or hydronephrosis.  Left Kidney:  Measures 9.8 cm.  Echogenic renal parenchyma, likely reflecting medical renal disease.  No mass or hydronephrosis.  Abdominal aorta:  No aneurysm identified.  Atherosclerosis.  IMPRESSION: Pancreas is poorly visualized due to overlying bowel gas.  1.8 cm left hepatic lobe cyst.  Mildly heterogeneous hepatic parenchyma, nonspecific.  Echogenic renal parenchyma, suggesting medical renal disease.  No hydronephrosis.   Original Report Authenticated By: Charline Bills, M.D.    Ct Abdomen Pelvis W Contrast  03/30/2012  *RADIOLOGY REPORT*  Clinical Data: Severe back pain and abdomen pain, nausea, decreased appetite  CT  ABDOMEN AND PELVIS WITH CONTRAST  Technique:  Multidetector CT imaging of the abdomen and pelvis was performed following the standard protocol during bolus administration of intravenous contrast.  Contrast: OMNIPAQUE IOHEXOL 300 MG/ML  SOLN  Comparison: CT abdomen pelvis of 01/31/2011  Findings: Chronic changes at the lung bases are stable, and there is a calcified granuloma deep in the  posterior left lower lobe sulcus.  Low attenuation structures are scattered throughout both the right and left lobes of liver which appear stable and most consistent with hepatic cysts.  No intrahepatic ductal dilatation is seen.  The gallbladder is unremarkable with no calcified gallstones noted.  The pancreas is atrophic in appearance.  The pancreatic duct is somewhat more prominent than noted previously and a subtle pancreatic lesion would be difficult to exclude on the current study.  The pancreatic duct was minimally prominent on the prior CT from 2012.  The adrenal glands are stable with a probable incidental small left adrenal adenoma present, and the spleen is stable in size.  Multiple small renal cysts are noted scattered throughout both kidneys.  No definite solid renal lesion is seen. There is dense calcification of the abdominal aortic wall but no focal aneurysm is seen. The abdominal aorta is very ectatic.   No adenopathy is noted.  The urinary bladder is moderately urine distended with no abnormality noted.  The uterus has previously been resected.  No adnexal lesion is seen.  No fluid is noted within the pelvis. There are rectosigmoid colonic diverticula noted and there is a moderate amount of feces throughout the entire colon. There is diffuse degenerative change throughout the lumbar spine with partial compression deformity pole of L1 vertebral body of uncertain age.  IMPRESSION:  1.  No abdominal or pelvic mass or adenopathy. 2.  Multiple small hepatic and renal cysts. 3.  The pancreatic duct is somewhat more  prominent than noted on CT of 2012, of questionable significance.  No definite mass is seen. 4.  Rectosigmoid colonic diverticula with a moderate amount of feces throughout the entire colon. 5.  Osteopenia and partial compression deformity of the L1 vertebral body of uncertain age.   Original Report Authenticated By: Dwyane Dee, M.D.    Ir Fluoro Guide Cv Line Right  04/07/2012  *RADIOLOGY REPORT*  Clinical Data: Infection.  Atrial fibrillation.  PICC LINE PLACEMENT WITH ULTRASOUND AND FLUOROSCOPIC  GUIDANCE  Fluoroscopy Time: 0.5 minutes.  The right neck was prepped with chlorhexidine, draped in the usual sterile fashion using maximum barrier technique (cap and mask, sterile gown, sterile gloves, large sterile sheet, hand hygiene and cutaneous antisepsis) and infiltrated locally with 1% Lidocaine.  Ultrasound demonstrated patency of the right internal jugular vein, and this was documented with an image.  Under real-time ultrasound guidance, this vein was accessed with a 21 gauge micropuncture needle and image documentation was performed.  The needle was exchanged over a guidewire for a peel-away sheath through which a five [place] double lumen PICC trimmed to 18 cm was advanced, positioned with its tip at the lower SVC/right atrial junction. Fluoroscopy during the procedure and fluoro spot radiograph confirms appropriate catheter position.  The catheter was flushed, secured to the skin with Prolene sutures, and covered with a sterile dressing.  Complications:  None  IMPRESSION: Successful right internal jugular PICC line placement with ultrasound and fluoroscopic guidance.  The catheter is ready for use.   Original Report Authenticated By: Jolaine Click, M.D.    Ir US Guide Vasc Access Right  04/07/2012  *RADIOLOGY REPORT*  Clinical Data: Infection.  Atrial fibrillation.  PICC LINE PLACEMENT WITH ULTRASOUND AND FLUOROSCOPIC  GUIDANCE  Fluoroscopy Time: 0.5 minutes.  The right neck was prepped with  chlorhexidine, draped in the usual sterile fashion using maximum barrier technique (cap and mask, sterile gown, sterile gloves, large sterile sheet, hand hygiene and cutaneous antisepsis) and infiltrated locally  with 1% Lidocaine.  Ultrasound demonstrated patency of the right internal jugular vein, and this was documented with an image.  Under real-time ultrasound guidance, this vein was accessed with a 21 gauge micropuncture needle and image documentation was performed.  The needle was exchanged over a guidewire for a peel-away sheath through which a five [place] double lumen PICC trimmed to 18 cm was advanced, positioned with its tip at the lower SVC/right atrial junction. Fluoroscopy during the procedure and fluoro spot radiograph confirms appropriate catheter position.  The catheter was flushed, secured to the skin with Prolene sutures, and covered with a sterile dressing.  Complications:  None  IMPRESSION: Successful right internal jugular PICC line placement with ultrasound and fluoroscopic guidance.  The catheter is ready for use.   Original Report Authenticated By: Jolaine Click, M.D.    Dg Abd Acute W/chest  04/06/2012  *RADIOLOGY REPORT*  Clinical Data: Abdominal pain  ACUTE ABDOMEN SERIES (ABDOMEN 2 VIEW & CHEST 1 VIEW)  Comparison: CT 04/01/2012  Findings: Lungs are clear.  Heart size upper limits normal. Ectatic thoracic aorta.  No effusion.  No free air.  Extensive aortoiliac arterial calcifications.  Paucity of bowel gas. Residual oral contrast in the distal colon, opacifying multiple diverticula.  Spondylitic changes in the lumbar spine.  IMPRESSION:  1.  Nonobstructive bowel gas pattern. 2.  No free air. 3.  Sigmoid diverticula. 4.  No acute cardiopulmonary disease.   Original Report Authenticated By: D. Andria Rhein, MD     Microbiology: Recent Results (from the past 240 hour(s))  MRSA PCR SCREENING     Status: Normal   Collection Time   04/06/12  5:51 PM      Component Value Range Status  Comment   MRSA by PCR NEGATIVE  NEGATIVE Final   CULTURE, BLOOD (ROUTINE X 2)     Status: Normal (Preliminary result)   Collection Time   04/06/12 10:10 PM      Component Value Range Status Comment   Specimen Description BLOOD LEFT ARM   Final    Special Requests BOTTLES DRAWN AEROBIC AND ANAEROBIC 1.5CC   Final    Culture  Setup Time 04/07/2012 01:21   Final    Culture     Final    Value:        BLOOD CULTURE RECEIVED NO GROWTH TO DATE CULTURE WILL BE HELD FOR 5 DAYS BEFORE ISSUING A FINAL NEGATIVE REPORT   Report Status PENDING   Incomplete   CULTURE, BLOOD (ROUTINE X 2)     Status: Normal (Preliminary result)   Collection Time   04/06/12 10:20 PM      Component Value Range Status Comment   Specimen Description BLOOD RIGHT HAND   Final    Special Requests BOTTLES DRAWN AEROBIC ONLY 1CC   Final    Culture  Setup Time 04/07/2012 01:21   Final    Culture     Final    Value:        BLOOD CULTURE RECEIVED NO GROWTH TO DATE CULTURE WILL BE HELD FOR 5 DAYS BEFORE ISSUING A FINAL NEGATIVE REPORT   Report Status PENDING   Incomplete   URINE CULTURE     Status: Normal   Collection Time   04/07/12  6:55 AM      Component Value Range Status Comment   Specimen Description URINE, CATHETERIZED   Final    Special Requests NONE   Final    Culture  Setup Time 04/07/2012 13:11  Final    Colony Count NO GROWTH   Final    Culture NO GROWTH   Final    Report Status 04/08/2012 FINAL   Final      Labs: Basic Metabolic Panel:  Lab 04/11/12 1610 04/10/12 0455 04/09/12 1130 04/09/12 0500 04/08/12 0857 04/07/12 0513  NA 136 138 -- 139 143 137  K 3.1* 3.5 -- 2.9* 3.3* 4.4  CL 99 100 -- 102 107 101  CO2 27 24 -- 23 23 25   GLUCOSE 97 76 -- 71 78 125*  BUN 18 17 -- 22 40* 69*  CREATININE 0.61 0.62 -- 0.70 0.97 2.08*  CALCIUM 8.9 8.9 -- 8.3* 8.2* 8.7  MG -- -- 1.2* -- -- --  PHOS -- -- -- -- -- --   Liver Function Tests:  Lab 04/07/12 0513 04/06/12 1448  AST 43* 64*  ALT 29 36*  ALKPHOS 172*  183*  BILITOT 0.4 0.5  PROT 5.9* 6.3  ALBUMIN 2.9* 3.1*    Lab 04/10/12 0455 04/09/12 1130 04/08/12 0910 04/07/12 0513 04/06/12 1448  LIPASE 126* 88* 230* 253* 203*  AMYLASE -- -- -- -- --   No results found for this basename: AMMONIA:5 in the last 168 hours CBC:  Lab 04/09/12 1130 04/07/12 0513 04/06/12 2210 04/06/12 1225  WBC 10.6* 12.7* 15.1* 13.2*  NEUTROABS -- -- -- 11.0*  HGB 11.4* 12.9 13.0 15.8*  HCT 33.2* 37.4 37.8 46.7*  MCV 88.5 87.4 87.7 91.4  PLT 266 243 268 235   Cardiac Enzymes:  Lab 04/07/12 0513 04/06/12 2210 04/06/12 1448  CKTOTAL -- -- --  CKMB -- -- --  CKMBINDEX -- -- --  TROPONINI <0.30 <0.30 <0.30   BNP: BNP (last 3 results) No results found for this basename: PROBNP:3 in the last 8760 hours CBG: No results found for this basename: GLUCAP:5 in the last 168 hours     Signed:  Elaya Droege  [facility] 04/11/2012, 11:09 AM

## 2012-04-10 NOTE — Consult Note (Signed)
HPI:  This very nice lady was admitted with possible syncope, metabolic encephalopathy, and pancreatitis.  According to her granddaughter, she was going downhill a bit over a period of time and is markedly improved.  She has no prior cardiac history other than long standing hypertension for which she saw Dr. Patty Sermons.  She is the widow of Dr. 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  She denies any chest pain. She gets around at [facility] with a walker and cane.  She has refused to come to the hospital and has asked to remain at her facility in the past---until this admission.    Current Facility-Administered Medications  Medication Dose Route Frequency Provider Last Rate Last Dose  . acetaminophen (TYLENOL) tablet 650 mg  650 mg Oral Q6H PRN Belkys A Regalado, MD   650 mg at 04/08/12 1849   Or  . acetaminophen (TYLENOL) suppository 650 mg  650 mg Rectal Q6H PRN Belkys A Regalado, MD      . amLODipine (NORVASC) tablet 5 mg  5 mg Oral Daily Kathlen Mody, MD   5 mg at 04/10/12 1032  . digoxin (LANOXIN) tablet 0.0625 mg  0.0625 mg Oral Daily Kathlen Mody, MD   0.0625 mg at 04/10/12 1032  . enoxaparin (LOVENOX) injection 30 mg  30 mg Subcutaneous Q24H Christine E Shade, PHARMD      . hydrALAZINE (APRESOLINE) injection 10 mg  10 mg Intravenous Q6H PRN Kathlen Mody, MD      . hydrALAZINE (APRESOLINE) tablet 25 mg  25 mg Oral Q8H Kathlen Mody, MD   25 mg at 04/10/12 0612  . LORazepam (ATIVAN) tablet 0.5 mg  0.5 mg Oral BID Kathlen Mody, MD   0.5 mg at 04/10/12 1031  . magnesium hydroxide (MILK OF MAGNESIA) suspension 30 mL  30 mL Oral Daily PRN Belkys A Regalado, MD      . metoprolol (LOPRESSOR) tablet 50 mg  50 mg Oral BID Kathlen Mody, MD   50 mg at 04/10/12 1032  . pantoprazole (PROTONIX) EC tablet 40 mg  40 mg Oral Q0600 Kathlen Mody, MD   40 mg at 04/10/12 0612  . sodium chloride 0.9 % injection 10-40 mL  10-40 mL Intracatheter Q12H Kathlen Mody, MD      . sodium chloride  0.9 % injection 10-40 mL  10-40 mL Intracatheter PRN Kathlen Mody, MD   10 mL at 04/10/12 0454  . sodium chloride 0.9 % injection 3 mL  3 mL Intravenous Q12H Belkys A Regalado, MD   3 mL at 04/10/12 1032    Allergies  Allergen Reactions  . Codeine Other (See Comments)    Unknown, MAR    Past Medical History  Diagnosis Date  . Hypertension   . Hypothyroidism   . Atrial fibrillation   . Hyponatremia     hx of  . Syncope     hx syncope and collapse per paper from [facility]  . Unconscious 04/06/12    per [facility] home paper -hx unconscious episodes -pt asked not to be sent to hospital for these  . Fx lumbar vertebra-closed     hx of per friends home notes  . Lower back pain     hx of    History reviewed. No pertinent past surgical history.  Strong family history of resistant HTN.    History   Social History  . Marital Status: Widowed    Spouse Name: Gabriel Rung    Number  of Children: 1   . Years of Education: Retired Engineer, civil (consulting).  Worked with the [employer]   Occupational History  . Nurse--retired    Social History Main Topics  . Smoking status: Never Smoker   . Smokeless tobacco: Never Used  . Alcohol Use: No  . Drug Use: No  . Sexually Active:    Other Topics Concern  . Not on file   Social History Narrative  . No narrative on file    ROS: Please see the HPI.  All other systems reviewed and negative.  PHYSICAL EXAM:  BP 154/75  Pulse 103  Temp 97.5 F (36.4 C) (Oral)  Resp 18  Ht 5\' 5"  (1.651 m)  Wt 89 lb 11.6 oz (40.7 kg)  BMI 14.93 kg/m2  SpO2 97%  General: Elderly frail female laying in bed, alert, able to answer questions  (we have mutual friendships) Head:  Normocephalic and atraumatic. Neck: no JVD Lungs: Decrease BS in the bases Heart: Normal S1 and S2.  Minimal SEM.   Abdomen:  Normal bowel sounds; soft; non tender; no organomegaly Pulses: Pulses normal in all 4 extremities. Extremities: No clubbing or cyanosis. No edema. Neurologic:  Alert and oriented x 3.  EKG:  NSR. Possible old inferior MI  (prior tracings look like LAFB).  Delay R wave progression.  Cannot exclude old ASMI.  Lateral ST depression, non specific.    ASSESSMENT AND PLAN:  1.  Long standing hypertension on multiple meds prior to admission including higher dose beta blockade and clonidine 2.  Abnormal ECG but non specific 3.  Recent pancreatitis 4.  Metabolic encephalopathy now resovled.  5.  Hypokalemia yesterday   Recommendations:  1.  The ECG is abnormal but non specific.  It is being driven by a combination of metabolic issues   (K 2.9) yesterday, and sinus tachycardia secondary to beta blocker withdrawal  (and clonidine). She is not interested, nor is her granddaughter, in aggressive evaluation, which seems appropriate as she had asked not to be brought to the hospital.  I would favor increasing her metoprolol to 75mg  BID and watch the BP response and HR, and you could recheck the 12 lead again in the am.  I would not favor workup as she is not symptomatic, and she does not want.    We will follow with you.   Recommendations:

## 2012-04-11 LAB — BASIC METABOLIC PANEL
BUN: 18 mg/dL (ref 6–23)
Creatinine, Ser: 0.61 mg/dL (ref 0.50–1.10)
GFR calc Af Amer: 89 mL/min — ABNORMAL LOW (ref >90)
GFR calc non Af Amer: 77 mL/min — ABNORMAL LOW (ref >90)

## 2012-04-11 MED ORDER — CLONIDINE HCL 0.1 MG PO TABS: 0.1000 mg | ORAL_TABLET | Freq: Every day | ORAL | Status: DC

## 2012-04-11 MED ORDER — METOPROLOL TARTRATE 25 MG PO TABS: 75.0000 mg | ORAL_TABLET | Freq: Two times a day (BID) | ORAL | Status: AC

## 2012-04-11 MED ORDER — HYDRALAZINE HCL 50 MG PO TABS
50.0000 mg | ORAL_TABLET | Freq: Three times a day (TID) | ORAL | Status: DC
  Administered 2012-04-11: 50 mg via ORAL
  Filled 2012-04-11 (×3): qty 1

## 2012-04-11 MED ORDER — CLONIDINE HCL 0.1 MG PO TABS
0.1000 mg | ORAL_TABLET | Freq: Every day | ORAL | Status: DC
  Administered 2012-04-11: 0.1 mg via ORAL
  Filled 2012-04-11: qty 1

## 2012-04-11 MED ORDER — POTASSIUM CHLORIDE 10 MEQ/100ML IV SOLN
10.0000 meq | INTRAVENOUS | Status: AC
  Administered 2012-04-11 (×2): 10 meq via INTRAVENOUS
  Filled 2012-04-11 (×2): qty 100

## 2012-04-11 MED ORDER — POTASSIUM CHLORIDE CRYS ER 20 MEQ PO TBCR
40.0000 meq | EXTENDED_RELEASE_TABLET | Freq: Two times a day (BID) | ORAL | Status: DC
  Administered 2012-04-11: 40 meq via ORAL
  Filled 2012-04-11 (×2): qty 2

## 2012-04-11 NOTE — Progress Notes (Signed)
DL PICC removed from RIJ per order for d/c to SNF.  No bleeding noted and no signs of infection.  Family and sitter at bedside aware of signs of infection via teach back method.  Vaseline guaze dressing applied to site.  No ADN.

## 2012-04-11 NOTE — Progress Notes (Signed)
Spoke with Pt's cousin, Mr. Ingle.  Discussed with Mr. Ingle Pt's d/c today.    Mr. Ernestine Mcmurray questioned how CSW got his information, as he's not HCPOA.  CSW explained that his number is listed as Pt's emergency contact.  Mr. Ernestine Mcmurray stated that Pt's [place] [phone]), should be listed as Pt's emergency contact.    Mr. Ernestine Mcmurray is happy to assist with Pt but he stated that he is not the one who needs to be updated on Pt's condition.  CSW thanked Mr. Ingle for his time and the information provided.  Spoke with Mrs. Suttle.  Mrs. Suttle explained that she is actually the financial POA; her mother, Glennis Brink, is the HCPOA.  Mrs. Murelle's contact information is listed in Pt's chart.  Mrs. Suttle's cell number is [phone].  CSW thanked her for her time.  Providence Crosby, LCSWA Clinical Social Work [phone]

## 2012-04-11 NOTE — Progress Notes (Signed)
Per MD, Pt ready for d/c.  Notified RN, Pt, caretaker and facility.  Caretaker stated that she just spoke with family and notified them that Pt would be d/c'ing today.  Sent d/c summary.  Confirmed receipt of d/c summary.  Facility ready to receive Pt.  Arranged for transportation.  Providence Crosby, LCSWA Clinical Social Work [phone]

## 2012-04-13 LAB — CULTURE, BLOOD (ROUTINE X 2): Culture: NO GROWTH

## 2012-04-23 IMAGING — CT CT HEAD W/O CM
1 series · 1 of 1 positions shown · non-contrast
Comparison: 11/16/2010

CT HEAD

CLINICAL DATA: Fall

CT HEAD WITHOUT CONTRAST
CT MAXILLOFACIAL WITHOUT CONTRAST
TECHNIQUE: Multidetector CT imaging of the head and maxillofacial
structures were performed using the standard protocol without
intravenous contrast. Multiplanar CT image reconstructions of the
maxillofacial structures were also generated.

[Series 1: scout · coronal · 0.6mm · 0.98mm/px · 1 of 1 slices shown]
[im 1/1]
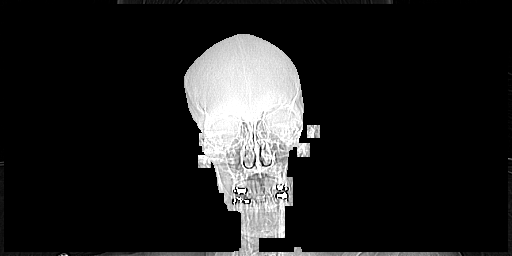

[1 of 1 positions shown; findings below may reference images not displayed]

FINDINGS: Age appropriate atrophy.  Chronic ischemia in the white
matter bilaterally.  No acute infarct.  Negative for hemorrhage or
mass lesion.  Negative for edema or fluid collection.  Negative for
skull fracture.
IMPRESSION: Chronic ischemic changes in the white matter.  No acute
abnormality.

CT MAXILLOFACIAL
FINDINGS: Large hematoma overlying the right face.

Negative for facial fracture.  Nasal bone and orbit are intact.
Mandible is intact.  Mild chronic sinusitis.

Image quality degraded by patient motion.
IMPRESSION: Large soft tissue hematoma right face.  Negative for facial
fracture.

## 2012-04-23 IMAGING — CR DG PORTABLE PELVIS
1 series · 1 of 1 positions shown · non-contrast
Comparison: None.

CLINICAL DATA: Fall

PORTABLE PELVIS

[view not recorded]
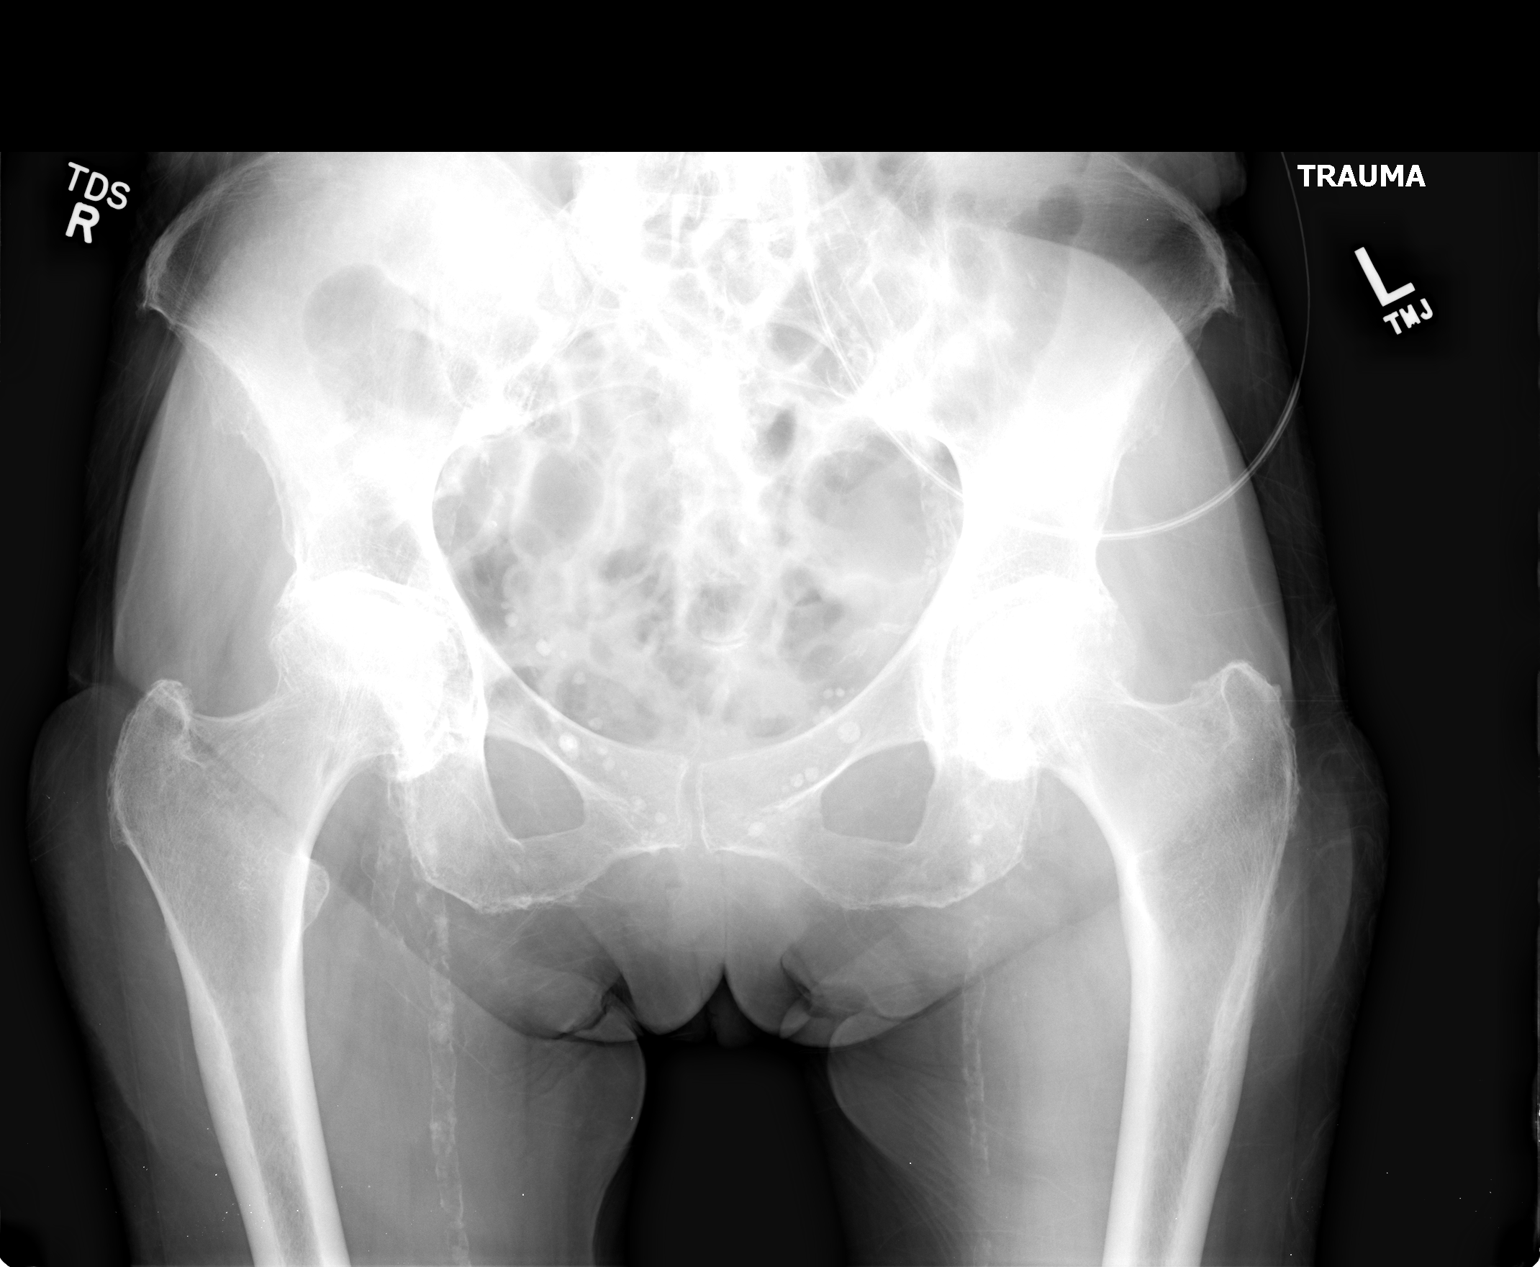

[1 of 1 positions shown; findings below may reference images not displayed]

FINDINGS: Moderate degenerative change of both hip joints with
joint space narrowing and spurring.

Negative for pelvic fracture.  Extensive atherosclerotic
calcification is present.
IMPRESSION: Osteoarthritis involving both hip joints.  Negative for pelvic
fracture.

## 2012-06-18 LAB — CBC AND DIFFERENTIAL: HCT: 28 % — AB (ref 36–46)

## 2012-06-18 LAB — HEPATIC FUNCTION PANEL
Alkaline Phosphatase: 89 U/L (ref 25–125)
Bilirubin, Total: 0.9 mg/dL

## 2012-06-18 LAB — BASIC METABOLIC PANEL
BUN: 24 mg/dL — AB (ref 4–21)
Creatinine: 0.9 mg/dL (ref 0.5–1.1)
Glucose: 104 mg/dL
Potassium: 4.1 mmol/L (ref 3.4–5.3)
Sodium: 136 mmol/L — AB (ref 137–147)

## 2012-08-17 LAB — CBC AND DIFFERENTIAL
Platelets: 200 10*3/uL (ref 150–399)
WBC: 8 10^3/mL

## 2012-08-17 LAB — BASIC METABOLIC PANEL
Creatinine: 1 mg/dL (ref 0.5–1.1)
Glucose: 111 mg/dL

## 2012-08-19 ENCOUNTER — Non-Acute Institutional Stay: Payer: Medicare Other | Admitting: Nurse Practitioner

## 2012-08-19 DIAGNOSIS — M549 Dorsalgia, unspecified: Secondary | ICD-10-CM | POA: Insufficient documentation

## 2012-08-19 DIAGNOSIS — F329 Major depressive disorder, single episode, unspecified: Secondary | ICD-10-CM

## 2012-08-19 DIAGNOSIS — I10 Essential (primary) hypertension: Secondary | ICD-10-CM

## 2012-08-19 DIAGNOSIS — K219 Gastro-esophageal reflux disease without esophagitis: Secondary | ICD-10-CM | POA: Insufficient documentation

## 2012-08-19 DIAGNOSIS — F32A Depression, unspecified: Secondary | ICD-10-CM

## 2012-08-19 DIAGNOSIS — K859 Acute pancreatitis without necrosis or infection, unspecified: Secondary | ICD-10-CM

## 2012-08-19 DIAGNOSIS — E871 Hypo-osmolality and hyponatremia: Secondary | ICD-10-CM

## 2012-08-19 HISTORY — DX: Depression, unspecified: F32.A

## 2012-08-19 NOTE — Progress Notes (Signed)
Patient ID: [name], female   DOB: [birth year], 79 y.o.   MRN: [id number]   Chief Complaint:  Chief Complaint  Patient presents with  . Medical Managment of Chronic Issues    hyponatremia, poor oral intake, nausea     HPI:   Alert and oriented, c/o poor appetite, mild nausea but no vomiting or diarrhea since 08/15/12 Problem List Items Addressed This Visit     ICD-9-CM   Pancreatitis     Hx of it, will repeat Amylase and Lipase, oral intake as tolerated for now.     Essential hypertension     Chronic elevated SBP--takes Clonidine 0.1mg  daily, Hydralazine 50mg  bid, Metoprolol 75mg  bid,     GERD (gastroesophageal reflux disease)   Back pain   Depression   Hyponatremia - Primary (Chronic)     Has been treated with Demeclocycline 150mg  bid with Na 136 06/18/12. N/V/D 08/15/12--none since then, but her oral intake remains poor Na 132 08/17/12. Will IVF D5 NS 2000cc at 75cc/hr--repeat BMP upon completion of IVF       Review of Systems: Review of Systems  Constitutional: Positive for malaise/fatigue. Negative for fever, chills, weight loss and diaphoresis.  HENT: Positive for hearing loss. Negative for congestion, sore throat, neck pain and ear discharge.   Eyes: Negative for pain, discharge and redness.  Respiratory: Negative for cough, sputum production and wheezing.   Cardiovascular: Negative for chest pain, palpitations, orthopnea, claudication, leg swelling and PND.  Gastrointestinal: Positive for nausea. Negative for heartburn, vomiting, abdominal pain, diarrhea, constipation and blood in stool.  Genitourinary: Negative for dysuria, urgency, frequency, hematuria and flank pain.  Musculoskeletal: Positive for back pain. Negative for myalgias and joint pain.  Skin: Negative for itching and rash.  Neurological: Positive for weakness. Negative for headaches.  Endo/Heme/Allergies: Negative for environmental allergies and polydipsia. Does not bruise/bleed easily.      Medications: Patient's Medications  New Prescriptions   No medications on file  Previous Medications   ACETAMINOPHEN (TYLENOL) 650 MG SUPPOSITORY    Place 650 mg rectally 2 (two) times daily. For pain and discomfort   CASCARA SAGRADA 450 MG CAPS    Take 450 mg by mouth daily as needed. For constipation   CHOLECALCIFEROL (VITAMIN D) 2000 UNITS TABLET    Take 2,000 Units by mouth every morning.   CLONIDINE (CATAPRES) 0.1 MG TABLET    Take 1 tablet (0.1 mg total) by mouth daily.   DEMECLOCYCLINE (DECLOMYCIN) 150 MG TABLET    Take 150 mg by mouth 2 (two) times daily.   DIGOXIN (LANOXIN) 0.125 MG TABLET    Take 0.0625 mg by mouth every morning.   HYDRALAZINE (APRESOLINE) 50 MG TABLET    Take 50 mg by mouth 2 (two) times daily.   LORAZEPAM (ATIVAN) 0.5 MG TABLET    Take 0.5 mg by mouth 2 (two) times daily.   MAGNESIUM HYDROXIDE (MILK OF MAGNESIA) 400 MG/5ML SUSPENSION    Take 30 mLs by mouth daily as needed. For constipation   METOPROLOL TARTRATE (LOPRESSOR) 25 MG TABLET    Take 3 tablets (75 mg total) by mouth 2 (two) times daily.   OMEPRAZOLE (PRILOSEC) 20 MG CAPSULE    Take 20 mg by mouth every morning.   POTASSIUM CHLORIDE (K-DUR) 10 MEQ TABLET    Take 10 mEq by mouth 2 (two) times daily.   VITAMIN B-12 (CYANOCOBALAMIN) 500 MCG TABLET    Take 500 mcg by mouth every morning.  Modified Medications   No medications  on file  Discontinued Medications   No medications on file     Physical Exam: Physical Exam  Constitutional: She is oriented to person, place, and time. She appears well-developed and well-nourished. No distress.  HENT:  Head: Normocephalic and atraumatic.  Mouth/Throat: No oropharyngeal exudate.  Eyes: Conjunctivae and EOM are normal. Pupils are equal, round, and reactive to light. Right eye exhibits no discharge. Left eye exhibits no discharge.  Neck: Normal range of motion. Neck supple. No JVD present. No thyromegaly present.  Cardiovascular: Normal rate, regular  rhythm and normal heart sounds.   No murmur heard. Pulmonary/Chest: Effort normal and breath sounds normal. No respiratory distress. She has no wheezes. She has no rales.  Abdominal: Soft. Bowel sounds are normal. She exhibits no distension. There is no tenderness.  Musculoskeletal: She exhibits no edema and no tenderness.  Lymphadenopathy:    She has no cervical adenopathy.  Neurological: She is alert and oriented to person, place, and time. She has normal reflexes. She displays normal reflexes. No cranial nerve deficit. She exhibits normal muscle tone. Coordination normal.  Skin: Skin is warm and dry. No rash noted. She is not diaphoretic. No erythema.  Psychiatric: Her mood appears not anxious. Her affect is not angry, not blunt, not labile and not inappropriate. Her speech is not rapid and/or pressured, not delayed, not tangential and not slurred. She is slowed. She is not agitated, not aggressive, not hyperactive, not withdrawn, not actively hallucinating and not combative. Thought content is not paranoid and not delusional. Cognition and memory are impaired. She does not exhibit a depressed mood. She is communicative. She exhibits abnormal recent memory.     Filed Vitals:   08/19/12 1138  BP: 186/78  Pulse: 80  Temp: 98 F (36.7 C)  TempSrc: Tympanic  Resp: 20      Labs reviewed: Basic Metabolic Panel:  Recent Labs  65/78/46 1448 04/06/12 2210  04/09/12 0500 04/09/12 1130 04/10/12 0455 04/11/12 0519 06/18/12 08/17/12  NA 132*  --   < > 139  --  138 136 136* 132*  K 4.6  --   < > 2.9*  --  3.5 3.1* 4.1 3.8  CL 95*  --   < > 102  --  100 99  --   --   CO2 24  --   < > 23  --  24 27  --   --   GLUCOSE 95  --   < > 71  --  76 97  --   --   BUN 64*  --   < > 22  --  17 18 24* 34*  CREATININE 1.65* 2.05*  < > 0.70  --  0.62 0.61 0.9 1.0  CALCIUM 9.2  --   < > 8.3*  --  8.9 8.9  --   --   MG  --   --   --   --  1.2*  --   --   --   --   TSH  --  2.335  --   --   --   --    --   --   --   < > = values in this interval not displayed.  Liver Function Tests:  Recent Labs  04/06/12 1448 04/07/12 0513 06/18/12  AST 64* 43* 16  ALT 36* 29 9  ALKPHOS 183* 172* 89  BILITOT 0.5 0.4  --   PROT 6.3 5.9*  --   ALBUMIN 3.1* 2.9*  --  CBC:  Recent Labs  04/06/12 1225 04/06/12 2210 04/07/12 0513 04/09/12 1130 06/18/12 08/17/12  WBC 13.2* 15.1* 12.7* 10.6* 5.6 8.0  NEUTROABS 11.0*  --   --   --   --   --   HGB 15.8* 13.0 12.9 11.4* 9.8* 11.1*  HCT 46.7* 37.8 37.4 33.2* 28* 33*  MCV 91.4 87.7 87.4 88.5  --   --   PLT 235 268 243 266 251 200    Anemia Panel:  Recent Labs  04/06/12 2210  VITAMINB12 >2000*    Significant Diagnostic Results:     Assessment/Plan Hyponatremia Has been treated with Demeclocycline 150mg  bid with Na 136 06/18/12. N/V/D 08/15/12--none since then, but her oral intake remains poor Na 132 08/17/12. Will IVF D5 NS 2000cc at 75cc/hr--repeat BMP upon completion of IVF  Pancreatitis Hx of it, will repeat Amylase and Lipase, oral intake as tolerated for now.   Essential hypertension Chronic elevated SBP--takes Clonidine 0.1mg  daily, Hydralazine 50mg  bid, Metoprolol 75mg  bid,       Family/ staff Communication: IVF and monitoring the patient, encourage oral fluid intake.    Goals of care: SN  Labs/tests ordered Amylase, Lipase, BMP

## 2012-08-19 NOTE — Assessment & Plan Note (Signed)
Chronic elevated SBP--takes Clonidine 0.1mg  daily, Hydralazine 50mg  bid, Metoprolol 75mg  bid,

## 2012-08-19 NOTE — Assessment & Plan Note (Signed)
Hx of it, will repeat Amylase and Lipase, oral intake as tolerated for now.

## 2012-08-19 NOTE — Assessment & Plan Note (Signed)
Has been treated with Demeclocycline 150mg  bid with Na 136 06/18/12. N/V/D 08/15/12--none since then, but her oral intake remains poor Na 132 08/17/12. Will IVF D5 NS 2000cc at 75cc/hr--repeat BMP upon completion of IVF

## 2012-08-20 LAB — BASIC METABOLIC PANEL
Creatinine: 0.8 mg/dL (ref 0.5–1.1)
Potassium: 3.9 mmol/L (ref 3.4–5.3)

## 2012-08-21 LAB — BASIC METABOLIC PANEL
BUN: 11 mg/dL (ref 4–21)
Glucose: 125 mg/dL
Potassium: 4.1 mmol/L (ref 3.4–5.3)

## 2012-08-25 LAB — BASIC METABOLIC PANEL
BUN: 16 mg/dL (ref 4–21)
Creatinine: 0.8 mg/dL (ref 0.5–1.1)
Sodium: 137 mmol/L (ref 137–147)

## 2012-08-27 ENCOUNTER — Non-Acute Institutional Stay: Payer: Medicare Other | Admitting: Nurse Practitioner

## 2012-08-27 DIAGNOSIS — I10 Essential (primary) hypertension: Secondary | ICD-10-CM

## 2012-08-27 DIAGNOSIS — M549 Dorsalgia, unspecified: Secondary | ICD-10-CM

## 2012-08-27 DIAGNOSIS — E871 Hypo-osmolality and hyponatremia: Secondary | ICD-10-CM

## 2012-08-27 DIAGNOSIS — K859 Acute pancreatitis without necrosis or infection, unspecified: Secondary | ICD-10-CM

## 2012-08-27 DIAGNOSIS — F329 Major depressive disorder, single episode, unspecified: Secondary | ICD-10-CM

## 2012-08-27 NOTE — Assessment & Plan Note (Signed)
Managed with Tylenol 650mg  bid and Tramadol 50mg  qam

## 2012-08-27 NOTE — Assessment & Plan Note (Signed)
Chronic elevated SBP--takes Clonidine 0.1mg daily, Hydralazine 50mg bid, Metoprolol 75mg bid,      

## 2012-08-27 NOTE — Assessment & Plan Note (Signed)
Has been treated with Demeclocycline 150mg  bid with Na 136 06/18/12. N/V/D 08/15/12--resolved,  oral intake has returned to her baseline now,  Na 132 08/17/12 and 137 after IVF D5 NS 2000cc at 75cc/hr

## 2012-08-27 NOTE — Assessment & Plan Note (Signed)
Mirtazapine 7.5mg  nightly helped.

## 2012-08-27 NOTE — Assessment & Plan Note (Signed)
Hx of it, repeated Amylase and Lipase wnl 08/20/12, resolved N/V/d

## 2012-08-27 NOTE — Progress Notes (Signed)
Patient ID: [name], female   DOB: 03-28-1919, 79 y.o.   MRN: [id number]  Chief Complaint:  Chief Complaint  Patient presents with  . Medical Managment of Chronic Issues    hyponatremia, diarrhea     HPI:  Problem List Items Addressed This Visit   Pancreatitis     Hx of it, repeated Amylase and Lipase wnl 08/20/12, resolved N/V/d      Back pain     Managed with Tylenol 650mg  bid and Tramadol 50mg  qam    Depression     Mirtazapine 7.5mg  nightly helped.     Essential hypertension (Chronic)     Chronic elevated SBP--takes Clonidine 0.1mg  daily, Hydralazine 50mg  bid, Metoprolol 75mg  bid,       Hyponatremia - Primary (Chronic)     Has been treated with Demeclocycline 150mg  bid with Na 136 06/18/12. N/V/D 08/15/12--resolved,  oral intake has returned to her baseline now,  Na 132 08/17/12 and 137 after IVF D5 NS 2000cc at 75cc/hr         Review of Systems: Review of Systems  Constitutional: Positive for malaise/fatigue. Negative for fever, chills, weight loss and diaphoresis.  HENT: Positive for hearing loss. Negative for congestion, sore throat, neck pain and ear discharge.   Eyes: Negative for pain, discharge and redness.  Respiratory: Negative for cough, sputum production and wheezing.   Cardiovascular: Negative for chest pain, palpitations, orthopnea, claudication, leg swelling and PND.  Gastrointestinal: Negative for heartburn, vomiting, abdominal pain, diarrhea, constipation and blood in stool.  Genitourinary: Negative for dysuria, urgency, frequency, hematuria and flank pain.  Musculoskeletal: Positive for back pain. Negative for myalgias and joint pain.  Skin: Negative for itching and rash.  Neurological: Positive for weakness. Negative for headaches.  Endo/Heme/Allergies: Negative for environmental allergies and polydipsia. Does not bruise/bleed easily.     Medications: Patient's Medications  New Prescriptions   No medications on file  Previous Medications    ACETAMINOPHEN (TYLENOL) 650 MG SUPPOSITORY    Place 650 mg rectally 2 (two) times daily. For pain and discomfort   CASCARA SAGRADA 450 MG CAPS    Take 450 mg by mouth daily as needed. For constipation   CHOLECALCIFEROL (VITAMIN D) 2000 UNITS TABLET    Take 2,000 Units by mouth every morning.   CLONIDINE (CATAPRES) 0.1 MG TABLET    Take 1 tablet (0.1 mg total) by mouth daily.   DEMECLOCYCLINE (DECLOMYCIN) 150 MG TABLET    Take 150 mg by mouth 2 (two) times daily.   DIGOXIN (LANOXIN) 0.125 MG TABLET    Take 0.0625 mg by mouth every morning.   HYDRALAZINE (APRESOLINE) 50 MG TABLET    Take 50 mg by mouth 2 (two) times daily.   LORAZEPAM (ATIVAN) 0.5 MG TABLET    Take 0.5 mg by mouth 2 (two) times daily.   MAGNESIUM HYDROXIDE (MILK OF MAGNESIA) 400 MG/5ML SUSPENSION    Take 30 mLs by mouth daily as needed. For constipation   METOPROLOL TARTRATE (LOPRESSOR) 25 MG TABLET    Take 3 tablets (75 mg total) by mouth 2 (two) times daily.   OMEPRAZOLE (PRILOSEC) 20 MG CAPSULE    Take 20 mg by mouth every morning.   POTASSIUM CHLORIDE (K-DUR) 10 MEQ TABLET    Take 10 mEq by mouth 2 (two) times daily.   VITAMIN B-12 (CYANOCOBALAMIN) 500 MCG TABLET    Take 500 mcg by mouth every morning.  Modified Medications   No medications on file  Discontinued Medications   No  medications on file     Physical Exam: Physical Exam  Constitutional: She is oriented to person, place, and time. She appears well-developed and well-nourished. No distress.  HENT:  Head: Normocephalic and atraumatic.  Mouth/Throat: No oropharyngeal exudate.  Eyes: Conjunctivae and EOM are normal. Pupils are equal, round, and reactive to light. Right eye exhibits no discharge. Left eye exhibits no discharge.  Neck: Normal range of motion. Neck supple. No JVD present. No thyromegaly present.  Cardiovascular: Normal rate, regular rhythm and normal heart sounds.   No murmur heard. Pulmonary/Chest: Effort normal and breath sounds normal. No  respiratory distress. She has no wheezes. She has no rales.  Abdominal: Soft. Bowel sounds are normal. She exhibits no distension. There is no tenderness.  Musculoskeletal: She exhibits no edema and no tenderness.  Lymphadenopathy:    She has no cervical adenopathy.  Neurological: She is alert and oriented to person, place, and time. She has normal reflexes. She displays normal reflexes. No cranial nerve deficit. She exhibits normal muscle tone. Coordination normal.  Skin: Skin is warm and dry. No rash noted. She is not diaphoretic. No erythema.  Psychiatric: Her mood appears not anxious. Her affect is not angry, not blunt, not labile and not inappropriate. Her speech is not rapid and/or pressured, not delayed, not tangential and not slurred. She is slowed. She is not agitated, not aggressive, not hyperactive, not withdrawn, not actively hallucinating and not combative. Thought content is not paranoid and not delusional. Cognition and memory are impaired. She does not exhibit a depressed mood. She is communicative. She exhibits abnormal recent memory.     Filed Vitals:   08/27/12 1136  BP: 184/76  Pulse: 86  Temp: 98 F (36.7 C)  TempSrc: Tympanic  Resp: 18      Labs reviewed: Lipase  08/20/12 26  Amylase   08/20/12 66  Basic Metabolic Panel:  Recent Labs  81/19/14 1448 04/06/12 2210  04/09/12 0500 04/09/12 1130 04/10/12 0455 04/11/12 0519  08/20/12 08/21/12 08/25/12  NA 132*  --   < > 139  --  138 136  < > 135* 133* 137  K 4.6  --   < > 2.9*  --  3.5 3.1*  < > 3.9 4.1 4.6  CL 95*  --   < > 102  --  100 99  --   --   --   --   CO2 24  --   < > 23  --  24 27  --   --   --   --   GLUCOSE 95  --   < > 71  --  76 97  --   --   --   --   BUN 64*  --   < > 22  --  17 18  < > 16 11 16   CREATININE 1.65* 2.05*  < > 0.70  --  0.62 0.61  < > 0.8 0.8 0.8  CALCIUM 9.2  --   < > 8.3*  --  8.9 8.9  --   --   --   --   MG  --   --   --   --  1.2*  --   --   --   --   --   --   TSH  --   2.335  --   --   --   --   --   --   --   --   --   < > =  values in this interval not displayed.  Liver Function Tests:  Recent Labs  04/06/12 1448 04/07/12 0513 06/18/12  AST 64* 43* 16  ALT 36* 29 9  ALKPHOS 183* 172* 89  BILITOT 0.5 0.4  --   PROT 6.3 5.9*  --   ALBUMIN 3.1* 2.9*  --     CBC:  Recent Labs  04/06/12 1225 04/06/12 2210 04/07/12 0513 04/09/12 1130 06/18/12 08/17/12  WBC 13.2* 15.1* 12.7* 10.6* 5.6 8.0  NEUTROABS 11.0*  --   --   --   --   --   HGB 15.8* 13.0 12.9 11.4* 9.8* 11.1*  HCT 46.7* 37.8 37.4 33.2* 28* 33*  MCV 91.4 87.7 87.4 88.5  --   --   PLT 235 268 243 266 251 200    Anemia Panel:  Recent Labs  04/06/12 2210  VITAMINB12 >2000*    Significant Diagnostic Results:     Assessment/Plan Hyponatremia Has been treated with Demeclocycline 150mg  bid with Na 136 06/18/12. N/V/D 08/15/12--resolved,  oral intake has returned to her baseline now,  Na 132 08/17/12 and 137 after IVF D5 NS 2000cc at 75cc/hr    Essential hypertension Chronic elevated SBP--takes Clonidine 0.1mg  daily, Hydralazine 50mg  bid, Metoprolol 75mg  bid,     Pancreatitis Hx of it, repeated Amylase and Lipase wnl 08/20/12, resolved N/V/d    Depression Mirtazapine 7.5mg  nightly helped.   Back pain Managed with Tylenol 650mg  bid and Tramadol 50mg  qam      Family/ staff Communication: encourage oral fluid intake   Goals of care: AL   Labs/tests ordered none

## 2012-12-15 LAB — CBC AND DIFFERENTIAL
HCT: 30 % — AB (ref 36–46)
Hemoglobin: 10.1 g/dL — AB (ref 12.0–16.0)
Platelets: 251 10*3/uL (ref 150–399)

## 2012-12-15 LAB — BASIC METABOLIC PANEL: Sodium: 133 mmol/L — AB (ref 137–147)

## 2012-12-16 ENCOUNTER — Non-Acute Institutional Stay: Payer: Medicare Other | Admitting: Nurse Practitioner

## 2012-12-16 DIAGNOSIS — I4891 Unspecified atrial fibrillation: Secondary | ICD-10-CM

## 2012-12-16 DIAGNOSIS — I10 Essential (primary) hypertension: Secondary | ICD-10-CM

## 2012-12-16 DIAGNOSIS — E871 Hypo-osmolality and hyponatremia: Secondary | ICD-10-CM

## 2012-12-16 DIAGNOSIS — K5900 Constipation, unspecified: Secondary | ICD-10-CM

## 2012-12-16 DIAGNOSIS — F329 Major depressive disorder, single episode, unspecified: Secondary | ICD-10-CM

## 2012-12-16 DIAGNOSIS — K859 Acute pancreatitis without necrosis or infection, unspecified: Secondary | ICD-10-CM

## 2012-12-16 DIAGNOSIS — F32A Depression, unspecified: Secondary | ICD-10-CM

## 2012-12-16 DIAGNOSIS — M549 Dorsalgia, unspecified: Secondary | ICD-10-CM

## 2012-12-16 HISTORY — DX: Constipation, unspecified: K59.00

## 2012-12-16 HISTORY — DX: Unspecified atrial fibrillation: I48.91

## 2012-12-16 NOTE — Assessment & Plan Note (Signed)
Chronic elevated SBP--takes Clonidine 0.1mg  daily, Hydralazine 50mg  bid, Metoprolol 75mg  bid,

## 2012-12-16 NOTE — Assessment & Plan Note (Signed)
Relapsed, declined Ortho referral, desires better pain control--adding Butrans 25mcg/hr for 7 days in addition to Tramadol and Tylenol. Urine culture pending.

## 2012-12-16 NOTE — Assessment & Plan Note (Signed)
Mirtazapine 7.5mg  nightly helped.

## 2012-12-16 NOTE — Progress Notes (Signed)
Patient ID: [name], female   DOB: 01-16-1919, 79 y.o.   MRN: [id number] Code Status: DNR  Allergies  Allergen Reactions  . Codeine Other (See Comments)    Unknown, MAR    Chief Complaint  Patient presents with  . Medical Managment of Chronic Issues    lower back pain.     HPI: Patient is a 79 y.o. female seen in the [facility] at [facility] today for evaluation of lower back pain and other chronic medical conditions.  Problem List Items Addressed This Visit   A-fib     Rate controlled on Digoxin daily.     Back pain - Primary     Relapsed, declined Ortho referral, desires better pain control--adding Butrans 1mcg/hr for 7 days in addition to Tramadol and Tylenol. Urine culture pending.     Relevant Medications      traMADol (ULTRAM) 50 MG tablet   Depression     Mirtazapine 7.5mg  nightly helped.       Relevant Medications      mirtazapine (REMERON SOL-TAB) 15 MG disintegrating tablet   Essential hypertension (Chronic)     Chronic elevated SBP--takes Clonidine 0.1mg  daily, Hydralazine 50mg  bid, Metoprolol 75mg  bid,         Hyponatremia (Chronic)     Has been treated with Demeclocycline 150mg  bid with Na 133 12/15/12       Pancreatitis     Hx of it, wnl Amylase 71 and Lipase 28, 12/15/12, c/o nausea x1      Relevant Medications      traMADol (ULTRAM) 50 MG tablet   Unspecified constipation     Managed with MiraLax daily and MOM prn.        Review of Systems:  Review of Systems  Constitutional: Positive for malaise/fatigue. Negative for fever, chills, weight loss and diaphoresis.  HENT: Positive for hearing loss. Negative for congestion, sore throat, neck pain and ear discharge.   Eyes: Negative for pain, discharge and redness.  Respiratory: Negative for cough, sputum production and wheezing.   Cardiovascular: Negative for chest pain, palpitations, orthopnea, claudication, leg swelling and PND.  Gastrointestinal: Negative for  heartburn, vomiting, abdominal pain, diarrhea, constipation and blood in stool.  Genitourinary: Negative for dysuria, urgency, frequency, hematuria and flank pain.  Musculoskeletal: Positive for back pain. Negative for myalgias and joint pain.  Skin: Negative for itching and rash.  Neurological: Positive for weakness. Negative for headaches.  Endo/Heme/Allergies: Negative for environmental allergies and polydipsia. Does not bruise/bleed easily.     Past Medical History  Diagnosis Date  . Hypertension   . Hypothyroidism   . Atrial fibrillation   . Hyponatremia     hx of  . Syncope     hx syncope and collapse per paper from [facility]  . Unconscious 04/06/12    per [facility] paper -hx unconscious episodes -pt asked not to be sent to hospital for these  . Fx lumbar vertebra-closed     hx of [facility]  . Lower back pain     hx of   Social History:   reports that she has never smoked. She has never used smokeless tobacco. She reports that she does not drink alcohol or use illicit drugs.   Medications: Patient's Medications  New Prescriptions   No medications on file  Previous Medications   ACETAMINOPHEN (TYLENOL) 650 MG SUPPOSITORY    Place 650 mg rectally 2 (two) times daily. For pain and discomfort  CHOLECALCIFEROL (VITAMIN D) 2000 UNITS TABLET    Take 2,000 Units by mouth every morning.   CLONIDINE (CATAPRES) 0.1 MG TABLET    Take 1 tablet (0.1 mg total) by mouth daily.   DEMECLOCYCLINE (DECLOMYCIN) 150 MG TABLET    Take 150 mg by mouth 2 (two) times daily.   DIGOXIN (LANOXIN) 0.125 MG TABLET    Take 0.0625 mg by mouth every morning.   HYDRALAZINE (APRESOLINE) 50 MG TABLET    Take 50 mg by mouth 2 (two) times daily.   LORAZEPAM (ATIVAN) 0.5 MG TABLET    Take 0.5 mg by mouth 2 (two) times daily as needed.    MAGNESIUM HYDROXIDE (MILK OF MAGNESIA) 400 MG/5ML SUSPENSION    Take 30 mLs by mouth daily as needed. For constipation   METOPROLOL TARTRATE  (LOPRESSOR) 25 MG TABLET    Take 3 tablets (75 mg total) by mouth 2 (two) times daily.   MIRTAZAPINE (REMERON SOL-TAB) 15 MG DISINTEGRATING TABLET    Take 7.5 mg by mouth at bedtime.   OMEPRAZOLE (PRILOSEC) 20 MG CAPSULE    Take 20 mg by mouth every morning.   POLYETHYLENE GLYCOL (MIRALAX / GLYCOLAX) PACKET    Take 17 g by mouth daily.   POTASSIUM CHLORIDE (K-DUR) 10 MEQ TABLET    Take 10 mEq by mouth 2 (two) times daily.   TRAMADOL (ULTRAM) 50 MG TABLET    Take 50 mg by mouth daily.   VITAMIN B-12 (CYANOCOBALAMIN) 500 MCG TABLET    Take 500 mcg by mouth every morning.  Modified Medications   No medications on file  Discontinued Medications   CASCARA SAGRADA 450 MG CAPS    Take 450 mg by mouth daily as needed. For constipation     Physical Exam: Physical Exam  Constitutional: She is oriented to person, place, and time. She appears well-developed and well-nourished. No distress.  HENT:  Head: Normocephalic and atraumatic.  Mouth/Throat: No oropharyngeal exudate.  Eyes: Conjunctivae and EOM are normal. Pupils are equal, round, and reactive to light. Right eye exhibits no discharge. Left eye exhibits no discharge.  Neck: Normal range of motion. Neck supple. No JVD present. No thyromegaly present.  Cardiovascular: Normal rate, regular rhythm and normal heart sounds.   No murmur heard. Pulmonary/Chest: Effort normal and breath sounds normal. No respiratory distress. She has no wheezes. She has no rales.  Abdominal: Soft. Bowel sounds are normal. She exhibits no distension. There is no tenderness.  Musculoskeletal: She exhibits tenderness (lower back). She exhibits no edema.  Lymphadenopathy:    She has no cervical adenopathy.  Neurological: She is alert and oriented to person, place, and time. She has normal reflexes. She displays normal reflexes. No cranial nerve deficit. She exhibits normal muscle tone. Coordination normal.  Skin: Skin is warm and dry. No rash noted. She is not  diaphoretic. No erythema.  Psychiatric: Her mood appears not anxious. Her affect is not angry, not blunt, not labile and not inappropriate. Her speech is not rapid and/or pressured, not delayed, not tangential and not slurred. She is slowed. She is not agitated, not aggressive, not hyperactive, not withdrawn, not actively hallucinating and not combative. Thought content is not paranoid and not delusional. Cognition and memory are impaired. She does not exhibit a depressed mood. She is communicative. She exhibits abnormal recent memory.    Filed Vitals:   12/16/12 1336  BP: 146/78  Pulse: 78  Temp: 98.2 F (36.8 C)  TempSrc: Tympanic  Resp: 20  Labs reviewed: Basic Metabolic Panel:  Recent Labs  16/10/96 1448 04/06/12 2210  04/09/12 0500 04/09/12 1130 04/10/12 0455 04/11/12 0519  08/21/12 08/25/12 12/15/12  NA 132*  --   < > 139  --  138 136  < > 133* 137 133*  K 4.6  --   < > 2.9*  --  3.5 3.1*  < > 4.1 4.6 4.0  CL 95*  --   < > 102  --  100 99  --   --   --   --   CO2 24  --   < > 23  --  24 27  --   --   --   --   GLUCOSE 95  --   < > 71  --  76 97  --   --   --   --   BUN 64*  --   < > 22  --  17 18  < > 11 16 22*  CREATININE 1.65* 2.05*  < > 0.70  --  0.62 0.61  < > 0.8 0.8 0.9  CALCIUM 9.2  --   < > 8.3*  --  8.9 8.9  --   --   --   --   MG  --   --   --   --  1.2*  --   --   --   --   --   --   TSH  --  2.335  --   --   --   --   --   --   --   --   --   < > = values in this interval not displayed. Liver Function Tests:  Recent Labs  04/06/12 1448 04/07/12 0513 06/18/12  AST 64* 43* 16  ALT 36* 29 9  ALKPHOS 183* 172* 89  BILITOT 0.5 0.4  --   PROT 6.3 5.9*  --   ALBUMIN 3.1* 2.9*  --     Recent Labs  04/08/12 0910 04/09/12 1130 04/10/12 0455  LIPASE 230* 88* 126*   CBC:  Recent Labs  04/06/12 1225 04/06/12 2210 04/07/12 0513 04/09/12 1130 06/18/12 08/17/12 12/15/12  WBC 13.2* 15.1* 12.7* 10.6* 5.6 8.0 6.8  NEUTROABS 11.0*  --   --   --    --   --   --   HGB 15.8* 13.0 12.9 11.4* 9.8* 11.1* 10.1*  HCT 46.7* 37.8 37.4 33.2* 28* 33* 30*  MCV 91.4 87.7 87.4 88.5  --   --   --   PLT 235 268 243 266 251 200 251   Lipase 12/15/12 28  Amylase 12/15/12 71  Assessment/Plan Back pain Relapsed, declined Ortho referral, desires better pain control--adding Butrans 12mcg/hr for 7 days in addition to Tramadol and Tylenol. Urine culture pending.   Hyponatremia Has been treated with Demeclocycline 150mg  bid with Na 133 12/15/12     Pancreatitis Hx of it, wnl Amylase 71 and Lipase 28, 12/15/12, c/o nausea x1    Depression Mirtazapine 7.5mg  nightly helped.     Essential hypertension Chronic elevated SBP--takes Clonidine 0.1mg  daily, Hydralazine 50mg  bid, Metoprolol 75mg  bid,       Unspecified constipation Managed with MiraLax daily and MOM prn.   A-fib Rate controlled on Digoxin daily.     Family/ Staff Communication: observe the patient.   Goals of Care: [place]  Labs/tests ordered: urine culture pending.

## 2012-12-16 NOTE — Assessment & Plan Note (Signed)
Rate controlled on Digoxin daily.

## 2012-12-16 NOTE — Assessment & Plan Note (Signed)
Managed with MiraLax daily and MOM prn.

## 2012-12-16 NOTE — Assessment & Plan Note (Signed)
Has been treated with Demeclocycline 150mg  bid with Na 133 12/15/12

## 2012-12-16 NOTE — Assessment & Plan Note (Signed)
Hx of it, wnl Amylase 71 and Lipase 28, 12/15/12, c/o nausea x1

## 2013-01-18 ENCOUNTER — Encounter: Payer: Self-pay | Admitting: *Deleted

## 2013-02-16 LAB — CBC AND DIFFERENTIAL
Hemoglobin: 11.3 g/dL — AB (ref 12.0–16.0)
PLATELETS: 226 10*3/uL (ref 150–399)
WBC: 9.1 10^3/mL

## 2013-02-16 LAB — BASIC METABOLIC PANEL
BUN: 45 mg/dL — AB (ref 4–21)
CREATININE: 1.1 mg/dL (ref 0.5–1.1)
Glucose: 97 mg/dL
POTASSIUM: 4.5 mmol/L (ref 3.4–5.3)
SODIUM: 138 mmol/L (ref 137–147)

## 2013-06-17 ENCOUNTER — Non-Acute Institutional Stay: Payer: Medicare Other | Admitting: Internal Medicine

## 2013-06-17 VITALS — BP 100/66 | HR 76 | Ht 65.0 in | Wt 107.0 lb

## 2013-06-17 DIAGNOSIS — N179 Acute kidney failure, unspecified: Secondary | ICD-10-CM

## 2013-06-17 DIAGNOSIS — I10 Essential (primary) hypertension: Secondary | ICD-10-CM

## 2013-06-17 DIAGNOSIS — E039 Hypothyroidism, unspecified: Secondary | ICD-10-CM

## 2013-06-17 DIAGNOSIS — K219 Gastro-esophageal reflux disease without esophagitis: Secondary | ICD-10-CM

## 2013-06-17 DIAGNOSIS — M549 Dorsalgia, unspecified: Secondary | ICD-10-CM

## 2013-06-17 DIAGNOSIS — N189 Chronic kidney disease, unspecified: Secondary | ICD-10-CM

## 2013-06-17 DIAGNOSIS — D649 Anemia, unspecified: Secondary | ICD-10-CM | POA: Insufficient documentation

## 2013-06-17 DIAGNOSIS — I4891 Unspecified atrial fibrillation: Secondary | ICD-10-CM

## 2013-06-17 LAB — CBC AND DIFFERENTIAL
HEMATOCRIT: 38 % (ref 36–46)
HEMOGLOBIN: 12.8 g/dL (ref 12.0–16.0)
Platelets: 270 10*3/uL (ref 150–399)
WBC: 8.1 10^3/mL

## 2013-06-17 LAB — BASIC METABOLIC PANEL
BUN: 33 mg/dL — AB (ref 4–21)
Creatinine: 1.1 mg/dL (ref 0.5–1.1)
Glucose: 97 mg/dL
POTASSIUM: 4.3 mmol/L (ref 3.4–5.3)
SODIUM: 137 mmol/L (ref 137–147)

## 2013-06-17 NOTE — Progress Notes (Signed)
Patient ID: Anne AuMargaret J Bridges, female   DOB: [birth year], 80 y.o.   MRN: [id number]    Location:  [facility]   Place of Service: Clinic (12)    Allergies  Allergen Reactions  . Codeine Other (See Comments)    Unknown, MAR    Chief Complaint  Patient presents with  . Medical Managment of Chronic Issues    blood pressure, thyroid, A-Fib, c/o back pain    HPI:  Essential hypertension:controlled  A-fib: chronic  Back pain: has received injections by Dr. Ethelene Halamos in fall of 2014. Back is hurting again and she has scheduled another time to see him.  Anemia, unspecified: resolved  GERD (gastroesophageal reflux disease)  Acute on chronic renal failure: stable  Hypothyroidism: not on levothyroxine at this time. I think it must have dropped off her meds when hospitalized last year. Needs follow up and probably resumption of levothyroxine. Last taking 18912mcg/ d.    Medications: Patient's Medications  New Prescriptions   No medications on file  Previous Medications   ACETAMINOPHEN (TYLENOL) 325 MG TABLET    Take 650 mg by mouth every 6 (six) hours. Take 2 tablets by mouth twice daily.   ACETAMINOPHEN (TYLENOL) 650 MG SUPPOSITORY    Place 650 mg rectally 2 (two) times daily. For pain and discomfort   ASPIRIN 81 MG CHEWABLE TABLET    Chew 81 mg by mouth daily.   BRIMONIDINE (ALPHAGAN) 0.2 % OPHTHALMIC SOLUTION       CHOLECALCIFEROL (VITAMIN D) 2000 UNITS TABLET    Take 2,000 Units by mouth every morning.   CLONIDINE (CATAPRES) 0.1 MG TABLET    Take 1 tablet (0.1 mg total) by mouth daily.   DEMECLOCYCLINE (DECLOMYCIN) 150 MG TABLET    Take 150 mg by mouth 2 (two) times daily.   DIGOXIN (LANOXIN) 0.125 MG TABLET    Take 0.125 mg by mouth daily. Take 1/2 tablet every morning * check pulse daily*   FUROSEMIDE (LASIX) 20 MG TABLET    Take 60 mg by mouth daily. Take 3 tablets daily to equal 60 mg.   HYDRALAZINE (APRESOLINE) 50 MG TABLET    Take 50 mg by mouth 2 (two) times  daily.   LEVOTHYROXINE (SYNTHROID, LEVOTHROID) 112 MCG TABLET    Take 112 mcg by mouth daily. *Check pulse weekly*   LORAZEPAM (ATIVAN) 0.5 MG TABLET    Take 0.5 mg by mouth 2 (two) times daily as needed for anxiety.    MAGNESIUM HYDROXIDE (MILK OF MAGNESIA) 400 MG/5ML SUSPENSION    Take 30 mLs by mouth daily as needed. For constipation   METOPROLOL TARTRATE (LOPRESSOR) 25 MG TABLET    Take 3 tablets (75 mg total) by mouth 2 (two) times daily.   MIRTAZAPINE (REMERON SOL-TAB) 15 MG DISINTEGRATING TABLET    Take 7.5 mg by mouth at bedtime.   OMEPRAZOLE (PRILOSEC) 20 MG CAPSULE    Take 20 mg by mouth every morning. * Do Not Crush* Take on a empty stomach.   POLYETHYLENE GLYCOL (MIRALAX / GLYCOLAX) PACKET    Take 17 g by mouth daily. Fill cap to 17 gm mark, mix with 4-6 ounces of fluid and take by mouth every day.   POTASSIUM CHLORIDE (K-DUR) 10 MEQ TABLET    Take 20 mEq by mouth daily. Take with food * Do not Crush*   TRAMADOL (ULTRAM) 50 MG TABLET    Take 50 mg by mouth every 6 (six) hours as needed for pain.    VITAMIN  B-12 (CYANOCOBALAMIN) 500 MCG TABLET    Take 500 mcg by mouth every morning.  Modified Medications   No medications on file  Discontinued Medications   No medications on file     Review of Systems  Constitutional: Positive for fatigue. Negative for activity change, appetite change and unexpected weight change.  HENT: Positive for hearing loss.   Eyes: Negative.   Respiratory: Negative.   Cardiovascular: Negative.   Gastrointestinal: Negative.   Endocrine: Negative.        Hx hypothyroidism  Genitourinary: Negative.   Musculoskeletal: Positive for back pain.  Skin: Negative.   Allergic/Immunologic: Negative.   Neurological: Positive for weakness.  Hematological: Negative.   Psychiatric/Behavioral: Negative.     Filed Vitals:   06/17/13 1350  BP: 100/66  Pulse: 76  Height: 5\' 5"  (1.651 m)  Weight: 107 lb (48.535 kg)   Physical Exam  Constitutional: She is  oriented to person, place, and time. She appears well-developed and well-nourished. No distress.  HENT:  Right Ear: External ear normal.  Left Ear: External ear normal.  Nose: Nose normal.  Mouth/Throat: Oropharynx is clear and moist. No oropharyngeal exudate.  Eyes: Conjunctivae and EOM are normal. Pupils are equal, round, and reactive to light.  Neck: No JVD present. No tracheal deviation present. No thyromegaly present.  Cardiovascular: Normal rate, regular rhythm, normal heart sounds and intact distal pulses.  Exam reveals no gallop and no friction rub.   No murmur heard. Pulmonary/Chest: No respiratory distress. She has no wheezes. She has no rales.  Abdominal: She exhibits no distension and no mass. There is no tenderness.  Musculoskeletal: Normal range of motion. She exhibits tenderness (lower back). She exhibits no edema.  Lymphadenopathy:    She has no cervical adenopathy.  Neurological: She is alert and oriented to person, place, and time. No cranial nerve deficit.  Memory loss  Skin: No rash noted. No erythema. No pallor.  Psychiatric: She has a normal mood and affect. Her behavior is normal. Judgment and thought content normal.     Labs reviewed: Nursing Home on 06/17/2013  Component Date Value Ref Range Status  . Hemoglobin 02/16/2013 11.3* 12.0 - 16.0 g/dL Final  . Platelets 16/01/9603 226  150 - 399 K/L Final  . WBC 02/16/2013 9.1   Final  . Glucose 02/16/2013 97   Final  . BUN 02/16/2013 45* 4 - 21 mg/dL Final  . Creatinine 54/12/8117 1.1  0.5 - 1.1 mg/dL Final  . Potassium 14/78/2956 4.5  3.4 - 5.3 mmol/L Final  . Sodium 02/16/2013 138  137 - 147 mmol/L Final  . Hemoglobin 06/17/2013 12.8  12.0 - 16.0 g/dL Final  . HCT 21/30/8657 38  36 - 46 % Final  . Platelets 06/17/2013 270  150 - 399 K/L Final  . WBC 06/17/2013 8.1   Final  . Glucose 06/17/2013 97   Final  . BUN 06/17/2013 33* 4 - 21 mg/dL Final  . Creatinine 84/69/6295 1.1  0.5 - 1.1 mg/dL Final  .  Potassium 28/41/3244 4.3  3.4 - 5.3 mmol/L Final  . Sodium 06/17/2013 137  137 - 147 mmol/L Final    06/17/13 amylase 90  Lipase: 61  Assessment/Plan 1. Essential hypertension controlled  2. A-fib Rate controlled  3. Back pain See Dr. Ethelene Hal for repeat spinal injectioin  4. Anemia, unspecified resolved  5. GERD (gastroesophageal reflux disease) asymptomatic  6. Acute on chronic renal failure stable  7. Hypothyroidism Needs TSH

## 2013-07-20 ENCOUNTER — Encounter: Payer: Self-pay | Admitting: Internal Medicine

## 2013-10-12 LAB — BASIC METABOLIC PANEL WITH GFR
BUN: 29 mg/dL — AB (ref 4–21)
Creatinine: 1 mg/dL (ref 0.5–1.1)
Glucose: 112 mg/dL
Potassium: 3.4 mmol/L (ref 3.4–5.3)
Sodium: 135 mmol/L — AB (ref 137–147)

## 2013-10-14 ENCOUNTER — Encounter: Payer: Self-pay | Admitting: Nurse Practitioner

## 2013-10-14 ENCOUNTER — Non-Acute Institutional Stay: Payer: Medicare Other | Admitting: Nurse Practitioner

## 2013-10-14 ENCOUNTER — Other Ambulatory Visit: Payer: Self-pay | Admitting: Nurse Practitioner

## 2013-10-14 ENCOUNTER — Other Ambulatory Visit: Payer: Self-pay

## 2013-10-14 DIAGNOSIS — F32A Depression, unspecified: Secondary | ICD-10-CM

## 2013-10-14 DIAGNOSIS — E876 Hypokalemia: Secondary | ICD-10-CM

## 2013-10-14 DIAGNOSIS — F3289 Other specified depressive episodes: Secondary | ICD-10-CM

## 2013-10-14 DIAGNOSIS — F329 Major depressive disorder, single episode, unspecified: Secondary | ICD-10-CM

## 2013-10-14 DIAGNOSIS — I480 Paroxysmal atrial fibrillation: Secondary | ICD-10-CM

## 2013-10-14 DIAGNOSIS — K5900 Constipation, unspecified: Secondary | ICD-10-CM

## 2013-10-14 DIAGNOSIS — E871 Hypo-osmolality and hyponatremia: Secondary | ICD-10-CM

## 2013-10-14 DIAGNOSIS — Z8719 Personal history of other diseases of the digestive system: Secondary | ICD-10-CM

## 2013-10-14 DIAGNOSIS — I10 Essential (primary) hypertension: Secondary | ICD-10-CM

## 2013-10-14 DIAGNOSIS — M545 Low back pain, unspecified: Secondary | ICD-10-CM

## 2013-10-14 DIAGNOSIS — E039 Hypothyroidism, unspecified: Secondary | ICD-10-CM

## 2013-10-14 DIAGNOSIS — K219 Gastro-esophageal reflux disease without esophagitis: Secondary | ICD-10-CM

## 2013-10-14 DIAGNOSIS — I4891 Unspecified atrial fibrillation: Secondary | ICD-10-CM

## 2013-10-14 MED ORDER — TRAMADOL HCL 50 MG PO TABS: 50.0000 mg | ORAL_TABLET | Freq: Every day | ORAL | Status: DC

## 2013-10-14 NOTE — Assessment & Plan Note (Signed)
C/o nausea. Will dc Tylenol, Tramadol, and Ibuprofen. Zofran 4mg  with meals x 3 days then tid prn. Continue Omeprazole.

## 2013-10-14 NOTE — Assessment & Plan Note (Signed)
C/o nausea for 2-3 days. Denied abd pain. Normal Amylase and Lipase 10/12/13

## 2013-10-14 NOTE — Telephone Encounter (Signed)
RX sent to [facility] @ [phone], phone number [phone]. This is a nursing home refill request.   

## 2013-10-14 NOTE — Assessment & Plan Note (Signed)
Chronic elevated SBP--takes Clonidine 0.1mg  daily, Hydralazine 50mg  bid, Metoprolol 75mg  bid

## 2013-10-14 NOTE — Progress Notes (Signed)
Patient ID: [name], female   DOB: [birth year], 80 y.o.   MRN: [id number]    Code Status: DNR  Allergies  Allergen Reactions  . Codeine Other (See Comments)    Unknown, MAR    Chief Complaint  Patient presents with  . Medical Management of Chronic Issues  . Acute Visit    nausea, back pain, hypokalemia.     HPI: Patient is a 80 y.o. female [facility] today for evaluation of nausea, hypokalemia,  lower back pain and other chronic medical conditions.  Problem List Items Addressed This Visit   Essential hypertension - Primary (Chronic)     Chronic elevated SBP--takes Clonidine 0.1mg  daily, Hydralazine 50mg  bid, Metoprolol 75mg  bid       Hyponatremia (Chronic)     Resolved. Continue Demeclocycline.     Hx of pancreatitis     C/o nausea for 2-3 days. Denied abd pain. Normal Amylase and Lipase 10/12/13    GERD (gastroesophageal reflux disease)     C/o nausea. Will dc Tylenol, Tramadol, and Ibuprofen. Zofran 4mg  with meals x 3 days then tid prn. Continue Omeprazole.     Back pain     Pain is not adequately controlled. Dc Tylenol, Tramadol, and Ibuprofen. Add Fentanyl patch 5812mcg/hr q72h. Observe the patient. Prn Norco available to her.     Relevant Medications      fentaNYL (DURAGESIC - DOSED MCG/HR) 12 MCG/HR   Depression     Stable: sleeps well at night until her c/o nausea 2-3days ago. Weights stabilized. Continue Remeron 7.5mg  nightly. Continue Lorazepam daily and bid prn.     Unspecified constipation     Stable. Takes prn MOM and Polyethylene    A-fib     Heart rate is in control. Continue Digoxin     Hypothyroidism     TSH 1.689 06/17/13    Hypokalemia     Mild. Serum K 3.4 10/12/13. Likely related her nausea and decreased oral intake. Dc Tylenol/Tramadol/Ibuprofen and added Zofran may relieve her nausea. Repeat BMP       Review of Systems:  Review of Systems  Constitutional: Positive for malaise/fatigue. Negative for  fever, chills, weight loss and diaphoresis.  HENT: Positive for hearing loss. Negative for congestion, ear discharge and sore throat.   Eyes: Negative for pain, discharge and redness.  Respiratory: Negative for cough, sputum production and wheezing.   Cardiovascular: Negative for chest pain, palpitations, orthopnea, claudication, leg swelling and PND.  Gastrointestinal: Positive for nausea. Negative for heartburn, vomiting, abdominal pain, diarrhea, constipation and blood in stool.  Genitourinary: Negative for dysuria, urgency, frequency, hematuria and flank pain.  Musculoskeletal: Positive for back pain. Negative for joint pain, myalgias and neck pain.  Skin: Negative for itching and rash.  Neurological: Positive for weakness. Negative for headaches.  Endo/Heme/Allergies: Negative for environmental allergies and polydipsia. Does not bruise/bleed easily.     Past Medical History  Diagnosis Date  . Hypertension   . Hypothyroidism   . Atrial fibrillation   . Hyponatremia     hx of  . Syncope     [facility] syncope and collapse [facility]  . Unconscious 04/06/12    [facility] -hx unconscious episodes -pt asked not to be sent to hospital for these  . Fx lumbar vertebra-closed     [facility]  . Lower back pain     hx of   Social History:  reports that she has never smoked. She has never used smokeless tobacco. She reports that she does not drink alcohol or use illicit drugs.   Medications: Patient's Medications  New Prescriptions   No medications on file  Previous Medications   CHOLECALCIFEROL (VITAMIN D) 2000 UNITS TABLET    Take 2,000 Units by mouth every morning.   CLONIDINE (CATAPRES) 0.1 MG TABLET    Take 1 tablet (0.1 mg total) by mouth daily.   DEMECLOCYCLINE (DECLOMYCIN) 150 MG TABLET    Take 150 mg by mouth 2 (two) times daily.   DIGOXIN (LANOXIN) 0.125 MG TABLET    Take 0.125 mg by mouth daily. Take 1/2 tablet every  morning * check pulse daily*   FENTANYL (DURAGESIC - DOSED MCG/HR) 12 MCG/HR    Place 12.5 mcg onto the skin every 3 (three) days.   HYDRALAZINE (APRESOLINE) 50 MG TABLET    Take 50 mg by mouth 2 (two) times daily.   LORAZEPAM (ATIVAN) 0.5 MG TABLET    Take 0.5 mg by mouth. Take one tablet every morning; take one twice daily as needed for anxiety   MAGNESIUM HYDROXIDE (MILK OF MAGNESIA) 400 MG/5ML SUSPENSION    Take 30 mLs by mouth daily as needed. For constipation   METOPROLOL TARTRATE (LOPRESSOR) 25 MG TABLET    Take 3 tablets (75 mg total) by mouth 2 (two) times daily.   MIRTAZAPINE (REMERON SOL-TAB) 15 MG DISINTEGRATING TABLET    Take 7.5 mg by mouth at bedtime.   OMEPRAZOLE (PRILOSEC) 20 MG CAPSULE    Take 20 mg by mouth every morning. * Do Not Crush* Take on a empty stomach.   POLYETHYLENE GLYCOL (MIRALAX / GLYCOLAX) PACKET    Take 17 g by mouth daily. Fill cap to 17 gm mark, mix with 4-6 ounces of fluid and take by mouth every day.   VITAMIN B-12 (CYANOCOBALAMIN) 500 MCG TABLET    Take 500 mcg by mouth every morning.  Modified Medications   No medications on file  Discontinued Medications   ACETAMINOPHEN (TYLENOL) 325 MG TABLET    Take 325 mg by mouth. Take two tablets twice daily   TRAMADOL (ULTRAM) 50 MG TABLET    Take 50 mg by mouth every 6 (six) hours as needed for pain.      Physical Exam: Physical Exam  Constitutional: She is oriented to person, place, and time. She appears well-developed and well-nourished. No distress.  HENT:  Head: Normocephalic and atraumatic.  Mouth/Throat: No oropharyngeal exudate.  Eyes: Conjunctivae and EOM are normal. Pupils are equal, round, and reactive to light. Right eye exhibits no discharge. Left eye exhibits no discharge.  Neck: Normal range of motion. Neck supple. No JVD present. No thyromegaly present.  Cardiovascular: Normal rate, regular rhythm and normal heart sounds.   No murmur heard. Pulmonary/Chest: Effort normal and breath sounds  normal. No respiratory distress. She has no wheezes. She has no rales.  Abdominal: Soft. Bowel sounds are normal. She exhibits no distension. There is no tenderness.  Musculoskeletal: She exhibits tenderness (lower back). She exhibits no edema.  Lymphadenopathy:    She has no cervical adenopathy.  Neurological: She is alert and oriented to person, place, and time. She has normal reflexes. No cranial nerve deficit. She exhibits normal muscle tone. Coordination normal.  Skin: Skin is warm and dry. No rash noted. She is not diaphoretic. No erythema.  Psychiatric: Her mood appears not anxious. Her affect is not angry, not blunt, not labile and not inappropriate.  Her speech is not rapid and/or pressured, not delayed, not tangential and not slurred. She is slowed. She is not agitated, not aggressive, not hyperactive, not withdrawn, not actively hallucinating and not combative. Thought content is not paranoid and not delusional. Cognition and memory are impaired. She does not exhibit a depressed mood. She is communicative. She exhibits abnormal recent memory.    Filed Vitals:   10/14/13 1048  BP: 122/62  Pulse: 64  Temp: 99.8 F (37.7 C)  TempSrc: Tympanic  Resp: 20      Labs reviewed: Basic Metabolic Panel:  Recent Labs  16/10/96 06/17/13 10/12/13  NA 138 137 135*  K 4.5 4.3 3.4  BUN 45* 33* 29*  CREATININE 1.1 1.1 1.0   Liver Function Tests: No results found for this basename: AST, ALT, ALKPHOS, BILITOT, PROT, ALBUMIN,  in the last 8760 hours No results found for this basename: LIPASE, AMYLASE,  in the last 8760 hours CBC:  Recent Labs  12/15/12 02/16/13 06/17/13  WBC 6.8 9.1 8.1  HGB 10.1* 11.3* 12.8  HCT 30*  --  38  PLT 251 226 270   Lipase 12/15/12 28  Amylase 12/15/12 71  Assessment/Plan Essential hypertension Chronic elevated SBP--takes Clonidine 0.1mg  daily, Hydralazine 50mg  bid, Metoprolol 75mg  bid     Hyponatremia Resolved. Continue Demeclocycline.   Hx of  pancreatitis C/o nausea for 2-3 days. Denied abd pain. Normal Amylase and Lipase 10/12/13  GERD (gastroesophageal reflux disease) C/o nausea. Will dc Tylenol, Tramadol, and Ibuprofen. Zofran 4mg  with meals x 3 days then tid prn. Continue Omeprazole.   Back pain Pain is not adequately controlled. Dc Tylenol, Tramadol, and Ibuprofen. Add Fentanyl patch 66mcg/hr q72h. Observe the patient. Prn Norco available to her.   Depression Stable: sleeps well at night until her c/o nausea 2-3days ago. Weights stabilized. Continue Remeron 7.5mg  nightly. Continue Lorazepam daily and bid prn.   Unspecified constipation Stable. Takes prn MOM and Polyethylene  A-fib Heart rate is in control. Continue Digoxin   Hypothyroidism TSH 1.689 06/17/13  Hypokalemia Mild. Serum K 3.4 10/12/13. Likely related her nausea and decreased oral intake. Dc Tylenol/Tramadol/Ibuprofen and added Zofran may relieve her nausea. Repeat BMP    Family/ Staff Communication: observe the patient.   Goals of Care: AL  Labs/tests ordered: BMP

## 2013-10-14 NOTE — Assessment & Plan Note (Signed)
Mild. Serum K 3.4 10/12/13. Likely related her nausea and decreased oral intake. Dc Tylenol/Tramadol/Ibuprofen and added Zofran may relieve her nausea. Repeat BMP

## 2013-10-14 NOTE — Assessment & Plan Note (Addendum)
Stable: sleeps well at night until her c/o nausea 2-3days ago. Weights stabilized. Continue Remeron 7.5mg  nightly. Continue Lorazepam daily and bid prn.

## 2013-10-14 NOTE — Assessment & Plan Note (Signed)
Heart rate is in control. Continue Digoxin

## 2013-10-14 NOTE — Assessment & Plan Note (Addendum)
Pain is not adequately controlled. Dc Tylenol, Tramadol, and Ibuprofen. Add Fentanyl patch 2812mcg/hr q72h. Observe the patient. Prn Norco available to her.

## 2013-10-14 NOTE — Assessment & Plan Note (Signed)
TSH 1.689 06/17/13

## 2013-10-14 NOTE — Assessment & Plan Note (Signed)
Resolved. Continue Demeclocycline.

## 2013-10-14 NOTE — Assessment & Plan Note (Signed)
Stable. Takes prn MOM and Polyethylene

## 2013-10-21 LAB — BASIC METABOLIC PANEL
CREATININE: 1.5 mg/dL — AB (ref 0.5–1.1)
GLUCOSE: 95 mg/dL
Potassium: 4.5 mmol/L (ref 3.4–5.3)

## 2013-11-23 LAB — BASIC METABOLIC PANEL
BUN: 21 mg/dL (ref 4–21)
Creatinine: 1.3 mg/dL — AB (ref 0.5–1.1)
Glucose: 102 mg/dL
Potassium: 3.4 mmol/L (ref 3.4–5.3)
SODIUM: 137 mmol/L (ref 137–147)

## 2013-11-25 ENCOUNTER — Encounter: Payer: Self-pay | Admitting: Nurse Practitioner

## 2013-11-25 ENCOUNTER — Other Ambulatory Visit: Payer: Self-pay | Admitting: Nurse Practitioner

## 2013-11-25 DIAGNOSIS — Z8719 Personal history of other diseases of the digestive system: Secondary | ICD-10-CM

## 2013-12-02 LAB — BASIC METABOLIC PANEL
BUN: 27 mg/dL — AB (ref 4–21)
Creatinine: 1.1 mg/dL (ref 0.5–1.1)
GLUCOSE: 90 mg/dL
Potassium: 4.2 mmol/L (ref 3.4–5.3)
SODIUM: 140 mmol/L (ref 137–147)

## 2013-12-09 ENCOUNTER — Other Ambulatory Visit: Payer: Self-pay | Admitting: Nurse Practitioner

## 2013-12-23 ENCOUNTER — Non-Acute Institutional Stay: Payer: Medicare Other | Admitting: Nurse Practitioner

## 2013-12-23 ENCOUNTER — Encounter: Payer: Self-pay | Admitting: Nurse Practitioner

## 2013-12-23 VITALS — BP 152/92 | HR 72 | Temp 97.6°F | Ht 65.0 in | Wt 103.0 lb

## 2013-12-23 DIAGNOSIS — M545 Low back pain, unspecified: Secondary | ICD-10-CM

## 2013-12-23 DIAGNOSIS — K5900 Constipation, unspecified: Secondary | ICD-10-CM

## 2013-12-23 DIAGNOSIS — Z299 Encounter for prophylactic measures, unspecified: Secondary | ICD-10-CM

## 2013-12-23 DIAGNOSIS — F32A Depression, unspecified: Secondary | ICD-10-CM

## 2013-12-23 DIAGNOSIS — Z8719 Personal history of other diseases of the digestive system: Secondary | ICD-10-CM

## 2013-12-23 DIAGNOSIS — I480 Paroxysmal atrial fibrillation: Secondary | ICD-10-CM

## 2013-12-23 DIAGNOSIS — E871 Hypo-osmolality and hyponatremia: Secondary | ICD-10-CM

## 2013-12-23 DIAGNOSIS — K219 Gastro-esophageal reflux disease without esophagitis: Secondary | ICD-10-CM

## 2013-12-23 DIAGNOSIS — E876 Hypokalemia: Secondary | ICD-10-CM

## 2013-12-23 DIAGNOSIS — F3289 Other specified depressive episodes: Secondary | ICD-10-CM

## 2013-12-23 DIAGNOSIS — I4891 Unspecified atrial fibrillation: Secondary | ICD-10-CM

## 2013-12-23 DIAGNOSIS — F329 Major depressive disorder, single episode, unspecified: Secondary | ICD-10-CM

## 2013-12-23 DIAGNOSIS — I10 Essential (primary) hypertension: Secondary | ICD-10-CM

## 2013-12-23 DIAGNOSIS — E039 Hypothyroidism, unspecified: Secondary | ICD-10-CM

## 2013-12-23 NOTE — Assessment & Plan Note (Signed)
No further C/o nausea since off  Tylenol, Tramadol, and Ibuprofen. Zofran  with meals x 3 days then tid prn. Continue Omeprazole.

## 2013-12-23 NOTE — Assessment & Plan Note (Addendum)
Pain is adequately controlled-better since Dc Tylenol, Tramadol, and Ibuprofen. Added Fentanyl patch 48mcg/hr q72h. Observe the patient. Prn Norco available to her.

## 2013-12-23 NOTE — Assessment & Plan Note (Addendum)
Chronic elevated SBP--takes Clonidine 0.1mg  daily, Hydralazine  bid, Metoprolol  bid. CMP prior to next appointment 06/2013

## 2013-12-23 NOTE — Assessment & Plan Note (Addendum)
10/12/13 Na 135 11/23/13 Na 137 Taking Demeclocycline  bid.

## 2013-12-23 NOTE — Assessment & Plan Note (Addendum)
Heart rate is in control. Continue Digoxin Dig level prior to next appointment 06/2014

## 2013-12-23 NOTE — Assessment & Plan Note (Signed)
11/23/13 K 3.4-Kcl 12/02/13 K 4.2

## 2013-12-23 NOTE — Assessment & Plan Note (Addendum)
TSH 1.689 06/17/13. Not taking supplement. Update TSH prior to next appointment 06/2014

## 2013-12-23 NOTE — Assessment & Plan Note (Signed)
Stable. Takes prn MOM and Polyethylene 

## 2013-12-23 NOTE — Assessment & Plan Note (Addendum)
Stable: sleeps well at night. Weights stabilized. Continue Remeron 7.5mg  nightly. Continue Lorazepam daily and bid prn. Update CBC and TSH.

## 2013-12-23 NOTE — Progress Notes (Signed)
Patient ID: Verlon Au, female   DOB: 1919/04/09, 80 y.o.   MRN: [id number]    Code Status: DNR  Allergies  Allergen Reactions  . Codeine Other (See Comments)    Unknown, MAR    Chief Complaint  Patient presents with  . Medical Management of Chronic Issues    Comprehensive Exam: blood pressure, A-Fib, anemia, CRF    HPI: Patient is a 80 y.o. female seen in the clinic at [facility] today for evaluation of hyponatremia, hypokalemia,  lower back pain and other chronic medical conditions.  Problem List Items Addressed This Visit   Essential hypertension (Chronic)     Chronic elevated SBP--takes Clonidine 0.1mg  daily, Hydralazine  bid, Metoprolol  bid. CMP prior to next appointment 06/2013      Hyponatremia (Chronic)     10/12/13 Na 135 11/23/13 Na 137 Taking Demeclocycline  bid.      Hx of pancreatitis     10/12/13 Amylase 50(0-105) Lipase 11(0-75) 11/23/13 Amylase 39. Lipase 14.      GERD (gastroesophageal reflux disease)     No further C/o nausea since off  Tylenol, Tramadol, and Ibuprofen. Zofran  with meals x 3 days then tid prn. Continue Omeprazole.      Relevant Medications      ondansetron (ZOFRAN) 4 MG tablet   Back pain     Pain is adequately controlled-better since Dc Tylenol, Tramadol, and Ibuprofen. Added Fentanyl patch 71mcg/hr q72h. Observe the patient. Prn Norco available to her.      Relevant Medications      HYDROcodone-acetaminophen (NORCO/VICODIN) 5-325 MG per tablet   Depression     Stable: sleeps well at night. Weights stabilized. Continue Remeron 7.5mg  nightly. Continue Lorazepam daily and bid prn. Update CBC and TSH.      Unspecified constipation     Stable. Takes prn MOM and Polyethylene     A-fib - Primary     Heart rate is in control. Continue Digoxin Dig level prior to next appointment 06/2014     Hypothyroidism     TSH 1.689 06/17/13. Not taking supplement. Update TSH prior to next appointment 06/2014     Hypokalemia     11/23/13 K 3.4-Kcl 12/02/13 K 4.2     Other Visit Diagnoses   Preventive measure        Relevant Orders       DNR (Do Not Resuscitate)       Review of Systems:  Review of Systems  Constitutional: Negative for fever, chills, weight loss, malaise/fatigue and diaphoresis.  HENT: Positive for hearing loss. Negative for congestion, ear discharge and sore throat.   Eyes: Negative for pain, discharge and redness.  Respiratory: Negative for cough, sputum production and wheezing.   Cardiovascular: Negative for chest pain, palpitations, orthopnea, claudication, leg swelling and PND.  Gastrointestinal: Negative for heartburn, nausea, vomiting, abdominal pain, diarrhea, constipation and blood in stool.  Genitourinary: Negative for dysuria, urgency, frequency, hematuria and flank pain.  Musculoskeletal: Positive for back pain. Negative for joint pain, myalgias and neck pain.       Better managed with Fentanyl   Skin: Negative for itching and rash.  Neurological: Positive for weakness. Negative for headaches.  Endo/Heme/Allergies: Negative for environmental allergies and polydipsia. Does not bruise/bleed easily.     Past Medical History  Diagnosis Date  . Hypertension   . Hypothyroidism   . Atrial fibrillation   . Hyponatremia     hx of  . Syncope  hx syncope and collapse per paper from [facility]  . Unconscious 04/06/12    per [facility] -hx unconscious episodes -pt asked not to be sent to hospital for these  . Fx lumbar vertebra-closed     [facility]  . Lower back pain     hx of   Social History:   reports that she has never smoked. She has never used smokeless tobacco. She reports that she does not drink alcohol or use illicit drugs.   Medications: Patient's Medications  New Prescriptions   No medications on file  Previous Medications   BRIMONIDINE (ALPHAGAN) 0.2 % OPHTHALMIC SOLUTION    One drop both eyes twice  daily   CHOLECALCIFEROL (VITAMIN D) 2000 UNITS TABLET    Take 2,000 Units by mouth every morning.   CLONIDINE (CATAPRES) 0.1 MG TABLET    Take 1 tablet (0.1 mg total) by mouth daily.   DEMECLOCYCLINE (DECLOMYCIN) 150 MG TABLET    Take 150 mg by mouth 2 (two) times daily.   DIGOXIN (LANOXIN) 0.125 MG TABLET    Take 0.125 mg by mouth daily. Take 1/2 tablet every morning * check pulse daily*   FENTANYL (DURAGESIC - DOSED MCG/HR) 12 MCG/HR    Place 12.5 mcg onto the skin every 3 (three) days.   HYDRALAZINE (APRESOLINE) 50 MG TABLET    Take 50 mg by mouth 2 (two) times daily.   HYDROCODONE-ACETAMINOPHEN (NORCO/VICODIN) 5-325 MG PER TABLET    Take 1 tablet by mouth. Take one three times daily as needed for pain   KLOR-CON M10 10 MEQ TABLET    Take one tablet daily   LORAZEPAM (ATIVAN) 0.5 MG TABLET    Take 0.5 mg by mouth. Take one tablet every morning; take one twice daily as needed for anxiety   MAGNESIUM HYDROXIDE (MILK OF MAGNESIA) 400 MG/5ML SUSPENSION    Take 30 mLs by mouth daily as needed. For constipation   METOPROLOL TARTRATE (LOPRESSOR) 25 MG TABLET    Take 3 tablets (75 mg total) by mouth 2 (two) times daily.   MIRTAZAPINE (REMERON SOL-TAB) 15 MG DISINTEGRATING TABLET    Take 7.5 mg by mouth at bedtime.   OMEPRAZOLE (PRILOSEC) 20 MG CAPSULE    Take 20 mg by mouth every morning. * Do Not Crush* Take on a empty stomach.   ONDANSETRON (ZOFRAN) 4 MG TABLET    Take one tablet three times daily as needed for nausea   POLYETHYLENE GLYCOL (MIRALAX / GLYCOLAX) PACKET    Take 17 g by mouth daily. Fill cap to 17 gm mark, mix with 4-6 ounces of fluid and take by mouth every day.   TRAMADOL (ULTRAM) 50 MG TABLET    Take 1 tablet (50 mg total) by mouth daily.   VITAMIN B-12 (CYANOCOBALAMIN) 500 MCG TABLET    Take 500 mcg by mouth every morning.  Modified Medications   No medications on file  Discontinued Medications   No medications on file     Physical Exam: Physical Exam  Constitutional: She is  oriented to person, place, and time. She appears well-developed and well-nourished. No distress.  HENT:  Head: Normocephalic and atraumatic.  Mouth/Throat: No oropharyngeal exudate.  Eyes: Conjunctivae and EOM are normal. Pupils are equal, round, and reactive to light. Right eye exhibits no discharge. Left eye exhibits no discharge.  Neck: Normal range of motion. Neck supple. No JVD present. No thyromegaly present.  Cardiovascular: Normal rate, regular rhythm and normal  heart sounds.   No murmur heard. Pulmonary/Chest: Effort normal and breath sounds normal. No respiratory distress. She has no wheezes. She has no rales.  Abdominal: Soft. Bowel sounds are normal. She exhibits no distension. There is no tenderness.  Musculoskeletal: She exhibits tenderness (lower back). She exhibits no edema.  Back pain is well managed with Fentanyl patch.   Lymphadenopathy:    She has no cervical adenopathy.  Neurological: She is alert and oriented to person, place, and time. She has normal reflexes. No cranial nerve deficit. She exhibits normal muscle tone. Coordination normal.  Skin: Skin is warm and dry. No rash noted. She is not diaphoretic. No erythema.  Psychiatric: Her mood appears not anxious. Her affect is not angry, not blunt, not labile and not inappropriate. Her speech is not rapid and/or pressured, not delayed, not tangential and not slurred. She is slowed. She is not agitated, not aggressive, not hyperactive, not withdrawn, not actively hallucinating and not combative. Thought content is not paranoid and not delusional. Cognition and memory are impaired. She does not exhibit a depressed mood. She is communicative. She exhibits abnormal recent memory.    Filed Vitals:   12/23/13 1606  BP: 152/92  Pulse: 72  Temp: 97.6 F (36.4 C)  TempSrc: Oral  Height:  (1.651 m)  Weight: 103 lb (46.72 kg)      Labs reviewed: Basic Metabolic Panel:  Recent Labs  16/10/96 10/21/13 11/23/13  12/02/13  NA 135*  --  137 140  K 3.4 4.5 3.4 4.2  BUN 29*  --  21 27*  CREATININE 1.0 1.5* 1.3* 1.1   Liver Function Tests: No results found for this basename: AST, ALT, ALKPHOS, BILITOT, PROT, ALBUMIN,  in the last 8760 hours No results found for this basename: LIPASE, AMYLASE,  in the last 8760 hours CBC:  Recent Labs  02/16/13 06/17/13  WBC 9.1 8.1  HGB 11.3* 12.8  HCT  --  38  PLT 226 270   Lipase 12/15/12 28  Amylase 12/15/12 71  Assessment/Plan A-fib Heart rate is in control. Continue Digoxin Dig level prior to next appointment 06/2014   Back pain Pain is adequately controlled-better since Dc Tylenol, Tramadol, and Ibuprofen. Added Fentanyl patch 44mcg/hr q72h. Observe the patient. Prn Norco available to her.    Depression Stable: sleeps well at night. Weights stabilized. Continue Remeron 7.5mg  nightly. Continue Lorazepam daily and bid prn. Update CBC and TSH.    Essential hypertension Chronic elevated SBP--takes Clonidine 0.1mg  daily, Hydralazine  bid, Metoprolol  bid. CMP prior to next appointment 06/2013    GERD (gastroesophageal reflux disease) No further C/o nausea since off  Tylenol, Tramadol, and Ibuprofen. Zofran  with meals x 3 days then tid prn. Continue Omeprazole.    Hx of pancreatitis 10/12/13 Amylase 50(0-105) Lipase 11(0-75) 11/23/13 Amylase 39. Lipase 14.    Hypokalemia 11/23/13 K 3.4-Kcl 12/02/13 K 4.2  Hyponatremia 10/12/13 Na 135 11/23/13 Na 137 Taking Demeclocycline  bid.    Hypothyroidism TSH 1.689 06/17/13. Not taking supplement. Update TSH prior to next appointment 06/2014   Unspecified constipation Stable. Takes prn MOM and Polyethylene     Family/ Staff Communication: observe the patient.   Goals of Care: AL return in 6 months.   Labs/tests ordered: CMP, TSH, CBC, Dig level prior to next appointment

## 2013-12-23 NOTE — Assessment & Plan Note (Signed)
10/12/13 Amylase 50(0-105) Lipase 11(0-75) 11/23/13 Amylase 39. Lipase 14.

## 2013-12-28 LAB — TSH: TSH: 2.01 u[IU]/mL (ref 0.41–5.90)

## 2013-12-28 LAB — CBC AND DIFFERENTIAL
HCT: 33 % — AB (ref 36–46)
Hemoglobin: 10.8 g/dL — AB (ref 12.0–16.0)
PLATELETS: 234 10*3/uL (ref 150–399)
WBC: 7.9 10*3/mL

## 2013-12-28 LAB — BASIC METABOLIC PANEL
BUN: 27 mg/dL — AB (ref 4–21)
CREATININE: 1.3 mg/dL — AB (ref 0.5–1.1)
GLUCOSE: 88 mg/dL
POTASSIUM: 4.5 mmol/L (ref 3.4–5.3)
Sodium: 140 mmol/L (ref 137–147)

## 2013-12-28 LAB — HEPATIC FUNCTION PANEL
ALT: 12 U/L (ref 7–35)
AST: 16 U/L (ref 13–35)
Alkaline Phosphatase: 80 U/L (ref 25–125)
Bilirubin, Total: 0.4 mg/dL

## 2013-12-30 ENCOUNTER — Other Ambulatory Visit: Payer: Self-pay | Admitting: Nurse Practitioner

## 2013-12-30 DIAGNOSIS — D649 Anemia, unspecified: Secondary | ICD-10-CM

## 2013-12-30 DIAGNOSIS — E871 Hypo-osmolality and hyponatremia: Secondary | ICD-10-CM

## 2013-12-30 DIAGNOSIS — I482 Chronic atrial fibrillation, unspecified: Secondary | ICD-10-CM

## 2013-12-30 DIAGNOSIS — E039 Hypothyroidism, unspecified: Secondary | ICD-10-CM

## 2014-01-31 ENCOUNTER — Other Ambulatory Visit: Payer: Self-pay | Admitting: *Deleted

## 2014-01-31 MED ORDER — FENTANYL 12 MCG/HR TD PT72: MEDICATED_PATCH | TRANSDERMAL | Status: DC

## 2014-01-31 NOTE — Telephone Encounter (Signed)
[facility] 

## 2014-03-07 ENCOUNTER — Other Ambulatory Visit: Payer: Self-pay | Admitting: *Deleted

## 2014-03-07 MED ORDER — FENTANYL 12 MCG/HR TD PT72: MEDICATED_PATCH | TRANSDERMAL | Status: DC

## 2014-03-24 ENCOUNTER — Other Ambulatory Visit: Payer: Self-pay | Admitting: Nurse Practitioner

## 2014-03-24 DIAGNOSIS — M5440 Lumbago with sciatica, unspecified side: Secondary | ICD-10-CM

## 2014-05-31 ENCOUNTER — Inpatient Hospital Stay (HOSPITAL_COMMUNITY)
Admission: EM | Admit: 2014-05-31 | Discharge: 2014-06-02 | DRG: 542 | Disposition: A | Discharge status: Nursing Home (Includes ICF) | Payer: Medicare Other | Attending: Internal Medicine | Admitting: Internal Medicine

## 2014-05-31 ENCOUNTER — Emergency Department (HOSPITAL_COMMUNITY): Payer: Medicare Other

## 2014-05-31 ENCOUNTER — Emergency Department (HOSPITAL_COMMUNITY)
Admission: EM | Admit: 2014-05-31 | Discharge: 2014-05-31 | Discharge status: Against Medical Advice | Payer: Medicare Other | Attending: Emergency Medicine | Admitting: Emergency Medicine

## 2014-05-31 ENCOUNTER — Encounter (HOSPITAL_COMMUNITY): Payer: Self-pay | Admitting: Emergency Medicine

## 2014-05-31 DIAGNOSIS — Z90710 Acquired absence of both cervix and uterus: Secondary | ICD-10-CM

## 2014-05-31 DIAGNOSIS — E039 Hypothyroidism, unspecified: Secondary | ICD-10-CM | POA: Diagnosis not present

## 2014-05-31 DIAGNOSIS — I129 Hypertensive chronic kidney disease with stage 1 through stage 4 chronic kidney disease, or unspecified chronic kidney disease: Secondary | ICD-10-CM | POA: Diagnosis not present

## 2014-05-31 DIAGNOSIS — R1011 Right upper quadrant pain: Secondary | ICD-10-CM | POA: Diagnosis present

## 2014-05-31 DIAGNOSIS — K859 Acute pancreatitis without necrosis or infection, unspecified: Secondary | ICD-10-CM | POA: Diagnosis present

## 2014-05-31 DIAGNOSIS — N183 Chronic kidney disease, stage 3 unspecified: Secondary | ICD-10-CM

## 2014-05-31 DIAGNOSIS — IMO0002 Reserved for concepts with insufficient information to code with codable children: Secondary | ICD-10-CM

## 2014-05-31 DIAGNOSIS — G8929 Other chronic pain: Secondary | ICD-10-CM | POA: Diagnosis present

## 2014-05-31 DIAGNOSIS — E871 Hypo-osmolality and hyponatremia: Secondary | ICD-10-CM | POA: Diagnosis present

## 2014-05-31 DIAGNOSIS — Z8673 Personal history of transient ischemic attack (TIA), and cerebral infarction without residual deficits: Secondary | ICD-10-CM | POA: Diagnosis not present

## 2014-05-31 DIAGNOSIS — Z79899 Other long term (current) drug therapy: Secondary | ICD-10-CM | POA: Diagnosis not present

## 2014-05-31 DIAGNOSIS — R109 Unspecified abdominal pain: Secondary | ICD-10-CM | POA: Diagnosis present

## 2014-05-31 DIAGNOSIS — M8088XA Other osteoporosis with current pathological fracture, vertebra(e), initial encounter for fracture: Principal | ICD-10-CM | POA: Diagnosis present

## 2014-05-31 DIAGNOSIS — I160 Hypertensive urgency: Secondary | ICD-10-CM | POA: Diagnosis present

## 2014-05-31 DIAGNOSIS — R52 Pain, unspecified: Secondary | ICD-10-CM

## 2014-05-31 DIAGNOSIS — M4854XA Collapsed vertebra, not elsewhere classified, thoracic region, initial encounter for fracture: Secondary | ICD-10-CM

## 2014-05-31 DIAGNOSIS — S32020D Wedge compression fracture of second lumbar vertebra, subsequent encounter for fracture with routine healing: Secondary | ICD-10-CM

## 2014-05-31 HISTORY — DX: Unspecified abdominal pain: R10.9

## 2014-05-31 HISTORY — DX: Chronic kidney disease, stage 3 unspecified: N18.30

## 2014-05-31 LAB — CBC WITH DIFFERENTIAL/PLATELET
BASOS ABS: 0 10*3/uL (ref 0.0–0.1)
BASOS PCT: 0 % (ref 0–1)
Eosinophils Absolute: 0 10*3/uL (ref 0.0–0.7)
Eosinophils Relative: 0 % (ref 0–5)
HEMATOCRIT: 34.5 % — AB (ref 36.0–46.0)
Hemoglobin: 11.3 g/dL — ABNORMAL LOW (ref 12.0–15.0)
Lymphocytes Relative: 18 % (ref 12–46)
Lymphs Abs: 1.6 10*3/uL (ref 0.7–4.0)
MCH: 31.7 pg (ref 26.0–34.0)
MCHC: 32.8 g/dL (ref 30.0–36.0)
MCV: 96.6 fL (ref 78.0–100.0)
Monocytes Absolute: 0.9 10*3/uL (ref 0.1–1.0)
Monocytes Relative: 10 % (ref 3–12)
NEUTROS ABS: 6.5 10*3/uL (ref 1.7–7.7)
Neutrophils Relative %: 72 % (ref 43–77)
Platelets: 230 10*3/uL (ref 150–400)
RBC: 3.57 MIL/uL — ABNORMAL LOW (ref 3.87–5.11)
RDW: 14.3 % (ref 11.5–15.5)
WBC: 9.1 10*3/uL (ref 4.0–10.5)

## 2014-05-31 LAB — URINALYSIS, ROUTINE W REFLEX MICROSCOPIC
Bilirubin Urine: NEGATIVE
GLUCOSE, UA: NEGATIVE mg/dL
Hgb urine dipstick: NEGATIVE
Ketones, ur: NEGATIVE mg/dL
LEUKOCYTES UA: NEGATIVE
NITRITE: NEGATIVE
PROTEIN: 30 mg/dL — AB
Specific Gravity, Urine: 1.013 (ref 1.005–1.030)
UROBILINOGEN UA: 0.2 mg/dL (ref 0.0–1.0)
pH: 7 (ref 5.0–8.0)

## 2014-05-31 LAB — COMPREHENSIVE METABOLIC PANEL
ALT: 10 U/L (ref 0–35)
AST: 19 U/L (ref 0–37)
Albumin: 3.5 g/dL (ref 3.5–5.2)
Alkaline Phosphatase: 86 U/L (ref 39–117)
Anion gap: 8 (ref 5–15)
BUN: 27 mg/dL — ABNORMAL HIGH (ref 6–23)
CHLORIDE: 102 mmol/L (ref 96–112)
CO2: 27 mmol/L (ref 19–32)
Calcium: 8.8 mg/dL (ref 8.4–10.5)
Creatinine, Ser: 1.14 mg/dL — ABNORMAL HIGH (ref 0.50–1.10)
GFR calc Af Amer: 47 mL/min — ABNORMAL LOW (ref >90)
GFR, EST NON AFRICAN AMERICAN: 40 mL/min — AB (ref >90)
GLUCOSE: 122 mg/dL — AB (ref 70–99)
Potassium: 3.7 mmol/L (ref 3.5–5.1)
Sodium: 137 mmol/L (ref 135–145)
Total Bilirubin: 0.9 mg/dL (ref 0.3–1.2)
Total Protein: 6.9 g/dL (ref 6.0–8.3)

## 2014-05-31 LAB — LIPASE, BLOOD: Lipase: 17 U/L (ref 11–59)

## 2014-05-31 LAB — URINE MICROSCOPIC-ADD ON

## 2014-05-31 MED ORDER — METOPROLOL TARTRATE 25 MG PO TABS
75.0000 mg | ORAL_TABLET | Freq: Once | ORAL | Status: AC
  Administered 2014-05-31: 75 mg via ORAL
  Filled 2014-05-31: qty 3

## 2014-05-31 MED ORDER — POLYETHYLENE GLYCOL 3350 17 G PO PACK: 17.0000 g | PACK | Freq: Every day | ORAL | Status: DC | PRN

## 2014-05-31 MED ORDER — HYDRALAZINE HCL 20 MG/ML IJ SOLN
5.0000 mg | Freq: Once | INTRAMUSCULAR | Status: DC
  Filled 2014-05-31: qty 1

## 2014-05-31 MED ORDER — MIRTAZAPINE 15 MG PO TBDP
7.5000 mg | ORAL_TABLET | Freq: Every day | ORAL | Status: DC
  Administered 2014-05-31 – 2014-06-01 (×2): 7.5 mg via ORAL
  Filled 2014-05-31 (×3): qty 0.5

## 2014-05-31 MED ORDER — SODIUM CHLORIDE 0.9 % IJ SOLN
3.0000 mL | Freq: Two times a day (BID) | INTRAMUSCULAR | Status: DC
  Administered 2014-06-01 – 2014-06-02 (×3): 3 mL via INTRAVENOUS

## 2014-05-31 MED ORDER — METOPROLOL TARTRATE 50 MG PO TABS
75.0000 mg | ORAL_TABLET | Freq: Two times a day (BID) | ORAL | Status: DC
  Administered 2014-05-31 – 2014-06-02 (×4): 75 mg via ORAL
  Filled 2014-05-31 (×5): qty 1

## 2014-05-31 MED ORDER — FENTANYL 12 MCG/HR TD PT72
12.5000 ug | MEDICATED_PATCH | TRANSDERMAL | Status: DC
  Administered 2014-05-31: 12.5 ug via TRANSDERMAL
  Filled 2014-05-31: qty 1

## 2014-05-31 MED ORDER — MORPHINE SULFATE 2 MG/ML IJ SOLN
2.0000 mg | Freq: Once | INTRAMUSCULAR | Status: DC
  Filled 2014-05-31: qty 1

## 2014-05-31 MED ORDER — ACETAMINOPHEN 325 MG PO TABS
650.0000 mg | ORAL_TABLET | Freq: Once | ORAL | Status: AC
  Administered 2014-05-31: 650 mg via ORAL
  Filled 2014-05-31: qty 2

## 2014-05-31 MED ORDER — ENOXAPARIN SODIUM 40 MG/0.4ML ~~LOC~~ SOLN
40.0000 mg | SUBCUTANEOUS | Status: DC
Start: 2014-05-31 — End: 2014-05-31

## 2014-05-31 MED ORDER — MORPHINE SULFATE 4 MG/ML IJ SOLN: 4.0000 mg | INTRAMUSCULAR | Status: DC | PRN

## 2014-05-31 MED ORDER — ONDANSETRON HCL 4 MG PO TABS: 4.0000 mg | ORAL_TABLET | Freq: Four times a day (QID) | ORAL | Status: DC | PRN

## 2014-05-31 MED ORDER — HYDRALAZINE HCL 20 MG/ML IJ SOLN
10.0000 mg | INTRAMUSCULAR | Status: DC | PRN
  Administered 2014-06-01: 10 mg via INTRAVENOUS
  Filled 2014-05-31: qty 1

## 2014-05-31 MED ORDER — DEMECLOCYCLINE HCL 150 MG PO TABS
150.0000 mg | ORAL_TABLET | Freq: Two times a day (BID) | ORAL | Status: DC
Start: 2014-05-31 — End: 2014-06-02
  Administered 2014-05-31 – 2014-06-02 (×4): 150 mg via ORAL
  Filled 2014-05-31 (×5): qty 1

## 2014-05-31 MED ORDER — CLONIDINE HCL 0.1 MG PO TABS
0.1000 mg | ORAL_TABLET | Freq: Every day | ORAL | Status: DC
  Administered 2014-06-01 – 2014-06-02 (×2): 0.1 mg via ORAL
  Filled 2014-05-31 (×2): qty 1

## 2014-05-31 MED ORDER — HYDRALAZINE HCL 50 MG PO TABS
50.0000 mg | ORAL_TABLET | Freq: Two times a day (BID) | ORAL | Status: DC
  Administered 2014-05-31 – 2014-06-02 (×4): 50 mg via ORAL
  Filled 2014-05-31 (×5): qty 1

## 2014-05-31 MED ORDER — PANTOPRAZOLE SODIUM 40 MG PO TBEC
40.0000 mg | DELAYED_RELEASE_TABLET | Freq: Every day | ORAL | Status: DC
  Administered 2014-06-01 – 2014-06-02 (×2): 40 mg via ORAL
  Filled 2014-05-31 (×3): qty 1

## 2014-05-31 MED ORDER — DIGOXIN 125 MCG PO TABS
0.0625 mg | ORAL_TABLET | Freq: Every day | ORAL | Status: DC
  Administered 2014-06-01 – 2014-06-02 (×2): 0.0625 mg via ORAL
  Filled 2014-05-31 (×2): qty 0.5

## 2014-05-31 MED ORDER — POTASSIUM CHLORIDE CRYS ER 10 MEQ PO TBCR
10.0000 meq | EXTENDED_RELEASE_TABLET | Freq: Every day | ORAL | Status: DC
  Administered 2014-06-01 – 2014-06-02 (×2): 10 meq via ORAL
  Filled 2014-05-31 (×2): qty 1

## 2014-05-31 MED ORDER — ACETAMINOPHEN 650 MG RE SUPP: 650.0000 mg | Freq: Four times a day (QID) | RECTAL | Status: DC | PRN

## 2014-05-31 MED ORDER — ACETAMINOPHEN 325 MG PO TABS
650.0000 mg | ORAL_TABLET | Freq: Four times a day (QID) | ORAL | Status: DC | PRN
  Administered 2014-06-01: 650 mg via ORAL
  Filled 2014-05-31: qty 2

## 2014-05-31 MED ORDER — LORAZEPAM 0.5 MG PO TABS
0.5000 mg | ORAL_TABLET | Freq: Every day | ORAL | Status: DC
  Administered 2014-06-01 – 2014-06-02 (×2): 0.5 mg via ORAL
  Filled 2014-05-31 (×2): qty 1

## 2014-05-31 MED ORDER — LORAZEPAM 0.5 MG PO TABS: 0.5000 mg | ORAL_TABLET | Freq: Two times a day (BID) | ORAL | Status: DC | PRN

## 2014-05-31 MED ORDER — FENTANYL CITRATE 0.05 MG/ML IJ SOLN
25.0000 ug | INTRAMUSCULAR | Status: DC | PRN
  Administered 2014-06-01 – 2014-06-02 (×2): 25 ug via INTRAVENOUS
  Filled 2014-05-31 (×2): qty 2

## 2014-05-31 MED ORDER — ONDANSETRON HCL 4 MG/2ML IJ SOLN
4.0000 mg | Freq: Four times a day (QID) | INTRAMUSCULAR | Status: DC | PRN
  Administered 2014-06-01: 4 mg via INTRAVENOUS
  Filled 2014-05-31: qty 2

## 2014-05-31 MED ORDER — HYDRALAZINE HCL 50 MG PO TABS
50.0000 mg | ORAL_TABLET | Freq: Once | ORAL | Status: AC
  Administered 2014-05-31: 50 mg via ORAL
  Filled 2014-05-31: qty 1

## 2014-05-31 MED ORDER — ENOXAPARIN SODIUM 30 MG/0.3ML ~~LOC~~ SOLN
30.0000 mg | Freq: Every day | SUBCUTANEOUS | Status: DC
  Administered 2014-05-31 – 2014-06-01 (×2): 30 mg via SUBCUTANEOUS
  Filled 2014-05-31 (×3): qty 0.3

## 2014-05-31 MED ORDER — BRIMONIDINE TARTRATE 0.2 % OP SOLN
1.0000 [drp] | Freq: Three times a day (TID) | OPHTHALMIC | Status: DC
  Administered 2014-05-31 – 2014-06-02 (×5): 1 [drp] via OPHTHALMIC
  Filled 2014-05-31: qty 5

## 2014-05-31 NOTE — ED Notes (Signed)
[facility], reports R back and R sided abdominal pain. Pt noted to be hypertensive with EMS, hx same.

## 2014-05-31 NOTE — H&P (Addendum)
[facility] History and Physical  Anne AuMargaret J Sonnier WUJ:[id number] DOB: 12/22/1918 DOA: 05/31/2014  Referring physician: ER physician. PCP: Kimber RelicGREEN, ARTHUR G, MD  Chief Complaint: Right upper abdominal pain.  HPI: [name] is a 81 y.o. female with history of hypertension, chronic kidney disease stage III, chronic hyponatremia, history of tachyarrhythmias of unclear etiology on digoxin, history of stroke presents to the ER because of worsening pain in the right upper quadrant since today morning. Patient denies any fall or trauma. Patient's pain is only on moving but otherwise patient is pain-free. Patient states the pain is quite excruciating when it happens. Patient does not have any nausea vomiting diarrhea fever chills shortness of breath or chest pain. Labs were unremarkable. CT abdomen and pelvis done shows multiple thoracic compression fractures and pancreas shows possible pancreatitis. Patient has been admitted for further management of her pain. On exam patient presently is pain-free but states that minimal movement increases the pain. Patient is receiving fentanyl IV when necessary and patient is also on a fentanyl patch. In addition patient's blood pressures found to be elevated in the ER and was given patient's home medication following which patient blood pressure improved.  Review of Systems: As presented in the history of presenting illness, rest negative.  Past Medical History  Diagnosis Date  . Hypertension   . Hypothyroidism   . Atrial fibrillation   . Hyponatremia     hx of  . Syncope     [facility]  . Unconscious 04/06/12    per [facility] -hx unconscious episodes -pt asked not to be sent to hospital for these  . Fx lumbar vertebra-closed     [facility]  . Lower back pain     hx of   Past Surgical History  Procedure Laterality Date  . Cataract extraction w/ intraocular lens   implant, bilateral    . Abdominal hysterectomy  1987  . Tonsillectomy and adenoidectomy    . Breast lumpectomy    . Colonoscopy  2003    Dr. Randa EvensEdwards   Social History:  reports that she has never smoked. She has never used smokeless tobacco. She reports that she does not drink alcohol or use illicit drugs. Where does patient live nursing home. Can patient participate in ADLs? Yes.  Allergies  Allergen Reactions  . Codeine Other (See Comments)    Unknown, MAR    Family History: History reviewed. No pertinent family history.    Prior to Admission medications   Medication Sig Start Date End Date Taking? Authorizing Provider  acetaminophen (TYLENOL) 325 MG tablet Take 650 mg by mouth every 4 (four) hours as needed for mild pain.   Yes Historical Provider, MD  brimonidine (ALPHAGAN) 0.2 % ophthalmic solution One drop both eyes twice daily 12/05/13  Yes Historical Provider, MD  Cholecalciferol (VITAMIN D) 2000 UNITS tablet Take 2,000 Units by mouth every morning.   Yes Historical Provider, MD  cloNIDine (CATAPRES) 0.1 MG tablet Take 1 tablet (0.1 mg total) by mouth daily. 04/11/12  Yes Kathlen ModyVijaya Akula, MD  demeclocycline (DECLOMYCIN) 150 MG tablet Take 150 mg by mouth 2 (two) times daily.   Yes Historical Provider, MD  digoxin (LANOXIN) 0.125 MG tablet Take 0.125 mg by mouth daily. Take 1/2 tablet every morning * check pulse daily*   Yes Historical Provider, MD  fentaNYL (DURAGESIC - DOSED MCG/HR) 12 MCG/HR Apply one patch topically every 3 days. Remove  old patch before applying new patch 03/07/14  Yes Tiffany L Reed, DO  hydrALAZINE (APRESOLINE) 50 MG tablet Take 50 mg by mouth 2 (two) times daily.   Yes Historical Provider, MD  KLOR-CON M10 10 MEQ tablet Take one tablet daily 12/20/13  Yes Historical Provider, MD  LORazepam (ATIVAN) 0.5 MG tablet Take 0.5 mg by mouth. Take one tablet every morning; take one twice daily as needed for anxiety   Yes Historical Provider, MD  magnesium hydroxide (MILK  OF MAGNESIA) 400 MG/5ML suspension Take 30 mLs by mouth daily as needed. For constipation   Yes Historical Provider, MD  metoprolol tartrate (LOPRESSOR) 25 MG tablet Take 3 tablets (75 mg total) by mouth 2 (two) times daily. 04/11/12  Yes Kathlen Mody, MD  mirtazapine (REMERON SOL-TAB) 15 MG disintegrating tablet Take 7.5 mg by mouth at bedtime.   Yes Historical Provider, MD  omeprazole (PRILOSEC) 20 MG capsule Take 20 mg by mouth every morning. * Do Not Crush* Take on a empty stomach.   Yes Historical Provider, MD  ondansetron (ZOFRAN) 4 MG tablet Take 4 mg by mouth 3 (three) times daily as needed for nausea or vomiting.   Yes Historical Provider, MD  polyethylene glycol (MIRALAX / GLYCOLAX) packet Take 17 g by mouth daily as needed for moderate constipation.   Yes Historical Provider, MD  vitamin B-12 (CYANOCOBALAMIN) 500 MCG tablet Take 500 mcg by mouth every morning.   Yes Historical Provider, MD  traMADol (ULTRAM) 50 MG tablet Take 1 tablet (50 mg total) by mouth daily. Patient not taking: Reported on 05/31/2014 10/14/13   Sharon Seller, NP    Physical Exam: Filed Vitals:   05/31/14 1800 05/31/14 1858 05/31/14 2013 05/31/14 2100  BP: 233/93 245/85 140/59 164/66  Pulse: 77 76 71 70  Temp:      TempSrc:      Resp: Height:      Weight:      SpO2: 94%  91% 91%     General:  Moderately built and nourished.  Eyes: Anicteric no pallor.  ENT: No discharge from the ears eyes nose or mouth.  Neck: No mass felt.  Cardiovascular: S1-S2 heard.  Respiratory: No rhonchi or crepitations.  Abdomen: Soft nontender bowel sounds present. No guarding or rigidity.  Skin: No rash.  Musculoskeletal: No tenderness over the spine.  Psychiatric: Appears normal.  Neurologic: Alert awake oriented to time place and person. Moves all extremities.  Labs on Admission:  Basic Metabolic Panel:  Recent Labs Lab 05/31/14 1826  NA 137  K 3.7  CL 102  CO2 27  GLUCOSE 122*  BUN 27*   CREATININE 1.14*  CALCIUM 8.8   Liver Function Tests:  Recent Labs Lab 05/31/14 1826  AST 19  ALT 10  ALKPHOS 86  BILITOT 0.9  PROT 6.9  ALBUMIN 3.5    Recent Labs Lab 05/31/14 2052  LIPASE 17   No results for input(s): AMMONIA in the last 168 hours. CBC:  Recent Labs Lab 05/31/14 1826  WBC 9.1  NEUTROABS 6.5  HGB 11.3*  HCT 34.5*  MCV 96.6  PLT 230   Cardiac Enzymes: No results for input(s): CKTOTAL, CKMB, CKMBINDEX, TROPONINI in the last 168 hours.  BNP (last 3 results) No results for input(s): BNP in the last 8760 hours.  ProBNP (last 3 results) No results for input(s): PROBNP in the last 8760 hours.  CBG: No results for input(s): GLUCAP in the last 168 hours.  Radiological Exams on Admission: Ct Abdomen Pelvis Wo Contrast  05/31/2014   CLINICAL DATA:  Right-sided back and abdominal pain, elevated blood pressure, history essential hypertension, pancreatitis, atrial fibrillation, acute on chronic renal failure  EXAM: CT ABDOMEN AND PELVIS WITHOUT CONTRAST  TECHNIQUE: Multidetector CT imaging of the abdomen and pelvis was performed following the standard protocol without IV contrast. Sagittal and coronal MPR images reconstructed from axial data set.  COMPARISON:  04/07/2012  FINDINGS: Calcified granuloma LEFT lower lobe.  Tiny nodule at base of RIGHT major fissure image 5.  Bibasilar atelectasis.  Extensive atherosclerotic disease of coronary arteries, abdominal aorta, branch vessels and iliac systems.  Aneurysmal dilatation of a tortuous abdominal aorta, 3.6 cm AP image 23, 3.2 cm transverse on coronal image 39.  Tiny hyperdense nodules in the RIGHT kidney again seen suspect hyperdense cysts.  Small RIGHT lobe hepatic cyst 6 mm diameter image 32 with additional lateral segment LEFT lobe lesion 17 x 11 mm image 22, likely an additional cyst.  Enlargement of pancreatic tail since previous exam with slightly indistinct margins raising question of distal pancreatitis.   Liver, spleen, kidneys and adrenal glands otherwise unremarkable.  Low lying cecum and RIGHT pelvis.  Sigmoid diverticulosis without definite evidence of diverticulitis.  LEFT inguinal hernia containing a nonobstructed small bowel loop.  Stomach and bowel loops otherwise unremarkable.  Tiny amount of fat at anterior bladder image 64.  No additional mass, adenopathy, free air or free fluid.  Bones demineralized with extensive compression deformities of thoracolumbar vertebrae.  IMPRESSION: Enlargement and slight ill definition of the pancreatic tail suspicious for acute pancreatitis; recommend correlation with serum amylase/lipase.  Hepatic and probable high attenuation RIGHT renal cysts.  Extensive atherosclerotic disease with mild aneurysmal dilatation of the mid abdominal aorta, maximum 3.6 x 3.2 cm.  LEFT inguinal hernia containing an unobstructed small bowel loop.  Osteoporosis with extensive compression deformities of the thoracolumbar spine.   Electronically Signed   By: Ulyses Southward M.D.   On: 05/31/2014 19:48     Assessment/Plan Principal Problem:   Abdominal pain Active Problems:   Hypothyroidism   Hypertensive urgency   CKD (chronic kidney disease) stage 3, GFR 30-59 ml/min   1. Right upper quadrant abdominal pain - labs are unremarkable though CT shows features consistent with possible pancreatitis though patient does not have any features for pancreatitis. Patient's pain is only on moving which suggest more of muscular skeletal. At this time I'll order right-sided rib series and also T-spine x-ray. Continue patient's chronic pain relieving medications and also patient has been placed on when necessary IV fentanyl. 2. Hypertensive urgency - for now we will continue patient's home blood pressure medications. Patient was given 1 dose of patient's home medication and follow his blood pressure improved. I also added when necessary IV hydralazine. Patient's blood pressure may be uncontrolled  secondary to patient's discomfort from pain. Closely follow blood pressure trends. 3. Chronic hyponatremia - on demeclocycline. Closely follow metabolic panel. 4. Chronic kidney disease stage III - appears to be at baseline. 5. History of tachyarrhythmias of unknown etiology - on digoxin and metoprolol. 6. History of stroke. 7. History of pancreatitis.  Addendum - x-ray of the T-spine shows new height loss at T11-12 vertebrae. I have discussed with on-call radiologist. At this time CT scan done earlier for the abdomen does not clearly suggest if it is new. MRI of the T-spine has been ordered.   DVT Prophylaxis Lovenox.  Code Status: Full code.  Family Communication: None.  Disposition Plan: Admit to inpatient.    Markail Diekman N. [facility] Pager [phone].  If 7PM-7AM, please contact night-coverage [email or web address] Password [facility] 05/31/2014, 10:22 PM

## 2014-05-31 NOTE — ED Provider Notes (Signed)
I saw and evaluated the patient, reviewed the resident's note and I agree with the findings and plan.   EKG Interpretation None      Patient here with right upper quadrant abdominal pain similar to her prior episode of pancreatitis. She also hypertensive here. Abdominal exam shows right upper quadrant tenderness to palpation without peritoneal signs. CT scan results reviewed. Patient's blood pressure treated here. Will be admitted to the medicine service  Toy BakerAnthony T Yoan Sallade, MD 05/31/14 2025

## 2014-05-31 NOTE — ED Notes (Signed)
[facility], pt has been c/o R sided back and abdominal pain, hx pancreatitis with similar symptoms. Pt also noted to be hypertensive with EMS, 240/108 hx same.

## 2014-05-31 NOTE — ED Notes (Signed)
Bed: [id number] Expected date:  Expected time:  Means of arrival:  Comments: EMS- 81yo F, Flank pain radiating into groin

## 2014-05-31 NOTE — ED Provider Notes (Signed)
CSN: [id number]     Arrival date & time 05/31/14  1743 History   First MD Initiated Contact with Patient 05/31/14 1751     Chief Complaint  Patient presents with  . Abdominal Pain    (Consider location/radiation/quality/duration/timing/severity/associated sxs/prior Treatment) HPI Comments: Pt is a 81 y.o. female presenting from nursing home with abdominal pain and significantly elevated BP. PMH significant for HTN, hypothyroid, afib, hyponatremia, recurrent syncope, pancreatitis. Pt states she awoke this morning with severe right sided abdominal pain. Pain feels sharp and radiates around her side and partly into her back. Pain only occurs when she moves/sits up, it goes away completely while laying flat. It is not made worse with deep inspiration, with eating/drinking. She has not taken anything for the pain. She denies any cough, shortness of breath, fevers/chills, some nausea but no vomiting, last BM was today (slightly constipated) but no blood or dark stool, last void today without burning.   Patient is a 81 y.o. female presenting with abdominal pain. The history is provided by the patient and a caregiver. No language interpreter was used.  Abdominal Pain Pain location:  RUQ Pain quality: sharp and shooting   Pain radiates to:  R flank Relieved by:  Lying down and not moving Worsened by:  Movement and position changes Ineffective treatments:  None tried Associated symptoms: constipation and nausea   Associated symptoms: no chest pain, no cough, no dysuria, no fever, no hematochezia, no hematuria, no melena, no shortness of breath and no vomiting     Past Medical History  Diagnosis Date  . Hypertension   . Hypothyroidism   . Atrial fibrillation   . Hyponatremia     hx of  . Syncope     hx syncope and collapse per paper from [facility]  . Unconscious 04/06/12    per [facility] paper -hx unconscious episodes -pt asked not to be sent to hospital for these  . Fx lumbar  vertebra-closed     [facility]  . Lower back pain     hx of   Past Surgical History  Procedure Laterality Date  . Cataract extraction w/ intraocular lens  implant, bilateral    . Abdominal hysterectomy  1987  . Tonsillectomy and adenoidectomy    . Breast lumpectomy    . Colonoscopy  2003    Dr. Randa Evens   History reviewed. No pertinent family history. History  Substance Use Topics  . Smoking status: Never Smoker   . Smokeless tobacco: Never Used  . Alcohol Use: No   OB History    No data available     Review of Systems  Constitutional: Negative for fever.  HENT: Negative for trouble swallowing.   Respiratory: Negative for cough and shortness of breath.   Cardiovascular: Negative for chest pain, palpitations and leg swelling.  Gastrointestinal: Positive for nausea, abdominal pain and constipation. Negative for vomiting, melena and hematochezia.  Genitourinary: Negative for dysuria and hematuria.  Skin: Negative for rash.  Neurological: Negative for dizziness, syncope, weakness and numbness.  All other systems reviewed and are negative.     Allergies  Codeine  Home Medications   Prior to Admission medications   Medication Sig Start Date End Date Taking? Authorizing Provider  acetaminophen (TYLENOL) 325 MG tablet Take 650 mg by mouth every 4 (four) hours as needed for mild pain.   Yes Historical Provider, MD  brimonidine (ALPHAGAN) 0.2 % ophthalmic solution One drop both eyes twice daily 12/05/13  Yes Historical Provider, MD  Cholecalciferol (VITAMIN D) 2000 UNITS tablet Take 2,000 Units by mouth every morning.   Yes Historical Provider, MD  cloNIDine (CATAPRES) 0.1 MG tablet Take 1 tablet (0.1 mg total) by mouth daily. 04/11/12  Yes Kathlen ModyVijaya Akula, MD  demeclocycline (DECLOMYCIN) 150 MG tablet Take 150 mg by mouth 2 (two) times daily.   Yes Historical Provider, MD  digoxin (LANOXIN) 0.125 MG tablet Take 0.125 mg by mouth daily. Take 1/2 tablet every  morning * check pulse daily*   Yes Historical Provider, MD  fentaNYL (DURAGESIC - DOSED MCG/HR) 12 MCG/HR Apply one patch topically every 3 days. Remove old patch before applying new patch 03/07/14  Yes Tiffany L Reed, DO  hydrALAZINE (APRESOLINE) 50 MG tablet Take 50 mg by mouth 2 (two) times daily.   Yes Historical Provider, MD  KLOR-CON M10 10 MEQ tablet Take one tablet daily 12/20/13  Yes Historical Provider, MD  LORazepam (ATIVAN) 0.5 MG tablet Take 0.5 mg by mouth. Take one tablet every morning; take one twice daily as needed for anxiety   Yes Historical Provider, MD  magnesium hydroxide (MILK OF MAGNESIA) 400 MG/5ML suspension Take 30 mLs by mouth daily as needed. For constipation   Yes Historical Provider, MD  metoprolol tartrate (LOPRESSOR) 25 MG tablet Take 3 tablets (75 mg total) by mouth 2 (two) times daily. 04/11/12  Yes Kathlen ModyVijaya Akula, MD  mirtazapine (REMERON SOL-TAB) 15 MG disintegrating tablet Take 7.5 mg by mouth at bedtime.   Yes Historical Provider, MD  omeprazole (PRILOSEC) 20 MG capsule Take 20 mg by mouth every morning. * Do Not Crush* Take on a empty stomach.   Yes Historical Provider, MD  ondansetron (ZOFRAN) 4 MG tablet Take 4 mg by mouth 3 (three) times daily as needed for nausea or vomiting.   Yes Historical Provider, MD  polyethylene glycol (MIRALAX / GLYCOLAX) packet Take 17 g by mouth daily as needed for moderate constipation.   Yes Historical Provider, MD  vitamin B-12 (CYANOCOBALAMIN) 500 MCG tablet Take 500 mcg by mouth every morning.   Yes Historical Provider, MD  traMADol (ULTRAM) 50 MG tablet Take 1 tablet (50 mg total) by mouth daily. Patient not taking: Reported on 05/31/2014 10/14/13   Sharon SellerJessica K Eubanks, NP   BP 140/59 mmHg  Pulse 71  Temp(Src) 97.8 F (36.6 C) (Oral)  Resp 15  Ht 5\' 5"  (1.651 m)  Wt 104 lb (47.174 kg)  BMI 17.31 kg/m2  SpO2 91% Physical Exam  HENT:  Mouth/Throat: Mucous membranes are dry.  Eyes: EOM are normal. Pupils are equal, round, and  reactive to light.  Cardiovascular: Normal rate, regular rhythm and intact distal pulses.   Murmur heard. Pulmonary/Chest: Effort normal and breath sounds normal. No respiratory distress. She has no wheezes. She has no rales. She exhibits tenderness (right lower costal border).  Abdominal: Soft. Bowel sounds are normal. She exhibits no distension and no mass. There is no rigidity, no rebound, no guarding and negative Murphy's sign.    Neurological: She is alert.  Skin: Skin is warm and dry. No rash noted.  Psychiatric: She has a normal mood and affect. Her behavior is normal. Judgment and thought content normal.  Nursing note and vitals reviewed.   ED Course  Procedures (including critical care time) Labs Review Labs Reviewed  COMPREHENSIVE METABOLIC PANEL - Abnormal; Notable for the following:    Glucose, Bld 122 (*)    BUN 27 (*)    Creatinine, Ser 1.14 (*)  GFR calc non Af Amer 40 (*)    GFR calc Af Amer 47 (*)    All other components within normal limits  CBC WITH DIFFERENTIAL/PLATELET - Abnormal; Notable for the following:    RBC 3.57 (*)    Hemoglobin 11.3 (*)    HCT 34.5 (*)    All other components within normal limits  URINALYSIS, ROUTINE W REFLEX MICROSCOPIC  LIPASE, BLOOD    Imaging Review Ct Abdomen Pelvis Wo Contrast  05/31/2014   CLINICAL DATA:  Right-sided back and abdominal pain, elevated blood pressure, history essential hypertension, pancreatitis, atrial fibrillation, acute on chronic renal failure  EXAM: CT ABDOMEN AND PELVIS WITHOUT CONTRAST  TECHNIQUE: Multidetector CT imaging of the abdomen and pelvis was performed following the standard protocol without IV contrast. Sagittal and coronal MPR images reconstructed from axial data set.  COMPARISON:  04/07/2012  FINDINGS: Calcified granuloma LEFT lower lobe.  Tiny nodule at base of RIGHT major fissure image 5.  Bibasilar atelectasis.  Extensive atherosclerotic disease of coronary arteries, abdominal aorta, branch  vessels and iliac systems.  Aneurysmal dilatation of a tortuous abdominal aorta, 3.6 cm AP image 23, 3.2 cm transverse on coronal image 39.  Tiny hyperdense nodules in the RIGHT kidney again seen suspect hyperdense cysts.  Small RIGHT lobe hepatic cyst 6 mm diameter image 32 with additional lateral segment LEFT lobe lesion 17 x 11 mm image 22, likely an additional cyst.  Enlargement of pancreatic tail since previous exam with slightly indistinct margins raising question of distal pancreatitis.  Liver, spleen, kidneys and adrenal glands otherwise unremarkable.  Low lying cecum and RIGHT pelvis.  Sigmoid diverticulosis without definite evidence of diverticulitis.  LEFT inguinal hernia containing a nonobstructed small bowel loop.  Stomach and bowel loops otherwise unremarkable.  Tiny amount of fat at anterior bladder image 64.  No additional mass, adenopathy, free air or free fluid.  Bones demineralized with extensive compression deformities of thoracolumbar vertebrae.  IMPRESSION: Enlargement and slight ill definition of the pancreatic tail suspicious for acute pancreatitis; recommend correlation with serum amylase/lipase.  Hepatic and probable high attenuation RIGHT renal cysts.  Extensive atherosclerotic disease with mild aneurysmal dilatation of the mid abdominal aorta, maximum 3.6 x 3.2 cm.  LEFT inguinal hernia containing an unobstructed small bowel loop.  Osteoporosis with extensive compression deformities of the thoracolumbar spine.   Electronically Signed   By: Ulyses Southward M.D.   On: 05/31/2014 19:48     EKG Interpretation None      MDM   Final diagnoses:  Acute pancreatitis, unspecified pancreatitis type   Abdominal pain: CT abdomen/pelvis showing possibly pancreatitis, lipase 17. Other labs wnl. Pt received tylenol with good relief, declined morphine IV while in ED. Pain primarily RUQ/lower ribs, however no mention of gallstones on CT; no hx of trauma to the area and no fractures of chest wall  seen on CT.  Hypertension: Home evening dose of hydralazine and metoprolol. BP initially uncontrolled so ordered for IV hydralazine , however SBP down to 140s so it was not given.   [facility] to admit for suspected pancreatitis.  Nani Ravens, MD 05/31/14 2150

## 2014-06-01 ENCOUNTER — Inpatient Hospital Stay (HOSPITAL_COMMUNITY): Payer: Medicare Other

## 2014-06-01 DIAGNOSIS — N183 Chronic kidney disease, stage 3 (moderate): Secondary | ICD-10-CM

## 2014-06-01 DIAGNOSIS — M8088XA Other osteoporosis with current pathological fracture, vertebra(e), initial encounter for fracture: Secondary | ICD-10-CM | POA: Diagnosis not present

## 2014-06-01 DIAGNOSIS — R1011 Right upper quadrant pain: Secondary | ICD-10-CM

## 2014-06-01 DIAGNOSIS — I10 Essential (primary) hypertension: Secondary | ICD-10-CM

## 2014-06-01 LAB — COMPREHENSIVE METABOLIC PANEL
ALT: 9 U/L (ref 0–35)
ANION GAP: 12 (ref 5–15)
AST: 18 U/L (ref 0–37)
Albumin: 3.2 g/dL — ABNORMAL LOW (ref 3.5–5.2)
Alkaline Phosphatase: 78 U/L (ref 39–117)
BILIRUBIN TOTAL: 0.9 mg/dL (ref 0.3–1.2)
BUN: 31 mg/dL — AB (ref 6–23)
CHLORIDE: 101 mmol/L (ref 96–112)
CO2: 30 mmol/L (ref 19–32)
Calcium: 9.1 mg/dL (ref 8.4–10.5)
Creatinine, Ser: 1.35 mg/dL — ABNORMAL HIGH (ref 0.50–1.10)
GFR calc non Af Amer: 33 mL/min — ABNORMAL LOW (ref >90)
GFR, EST AFRICAN AMERICAN: 38 mL/min — AB (ref >90)
GLUCOSE: 101 mg/dL — AB (ref 70–99)
Potassium: 4 mmol/L (ref 3.5–5.1)
Sodium: 143 mmol/L (ref 135–145)
TOTAL PROTEIN: 6.2 g/dL (ref 6.0–8.3)

## 2014-06-01 LAB — LIPASE, BLOOD: Lipase: 17 U/L (ref 11–59)

## 2014-06-01 LAB — CBC WITH DIFFERENTIAL/PLATELET
Basophils Absolute: 0 10*3/uL (ref 0.0–0.1)
Basophils Relative: 0 % (ref 0–1)
EOS ABS: 0 10*3/uL (ref 0.0–0.7)
EOS PCT: 0 % (ref 0–5)
HCT: 33.1 % — ABNORMAL LOW (ref 36.0–46.0)
Hemoglobin: 10.9 g/dL — ABNORMAL LOW (ref 12.0–15.0)
LYMPHS PCT: 24 % (ref 12–46)
Lymphs Abs: 1.8 10*3/uL (ref 0.7–4.0)
MCH: 32.2 pg (ref 26.0–34.0)
MCHC: 32.9 g/dL (ref 30.0–36.0)
MCV: 97.6 fL (ref 78.0–100.0)
MONOS PCT: 10 % (ref 3–12)
Monocytes Absolute: 0.8 10*3/uL (ref 0.1–1.0)
Neutro Abs: 5 10*3/uL (ref 1.7–7.7)
Neutrophils Relative %: 66 % (ref 43–77)
Platelets: 241 10*3/uL (ref 150–400)
RBC: 3.39 MIL/uL — AB (ref 3.87–5.11)
RDW: 14.8 % (ref 11.5–15.5)
WBC: 7.5 10*3/uL (ref 4.0–10.5)

## 2014-06-01 LAB — CREATININE, SERUM
CREATININE: 1.17 mg/dL — AB (ref 0.50–1.10)
GFR calc Af Amer: 45 mL/min — ABNORMAL LOW (ref >90)
GFR calc non Af Amer: 39 mL/min — ABNORMAL LOW (ref >90)

## 2014-06-01 LAB — CBC
HEMATOCRIT: 32 % — AB (ref 36.0–46.0)
HEMOGLOBIN: 10.7 g/dL — AB (ref 12.0–15.0)
MCH: 32.3 pg (ref 26.0–34.0)
MCHC: 33.4 g/dL (ref 30.0–36.0)
MCV: 96.7 fL (ref 78.0–100.0)
PLATELETS: 227 10*3/uL (ref 150–400)
RBC: 3.31 MIL/uL — ABNORMAL LOW (ref 3.87–5.11)
RDW: 14.7 % (ref 11.5–15.5)
WBC: 8.1 10*3/uL (ref 4.0–10.5)

## 2014-06-01 LAB — TROPONIN I: TROPONIN I: 0.03 ng/mL (ref <0.031)

## 2014-06-01 LAB — TSH: TSH: 1.457 u[IU]/mL (ref 0.350–4.500)

## 2014-06-01 MED ORDER — HYDROCODONE-ACETAMINOPHEN 7.5-325 MG PO TABS
1.0000 | ORAL_TABLET | Freq: Four times a day (QID) | ORAL | Status: DC | PRN
  Administered 2014-06-01 – 2014-06-02 (×2): 1 via ORAL
  Filled 2014-06-01 (×3): qty 1

## 2014-06-01 MED ORDER — POLYETHYLENE GLYCOL 3350 17 G PO PACK: 17.0000 g | PACK | Freq: Every day | ORAL | Status: DC | PRN

## 2014-06-01 NOTE — Progress Notes (Signed)
[facility] PROGRESS NOTE  Assessment/Plan: Right upper quadrant Abdominal pain - Most likely due to thoracic compression fractures, she does have mild compression fracture of T11 and L2. - We'll discuss with neurosurgery. - Continue IV narcotics and oral. - Consult physical therapy.  Hypertensive urgency - There is most likely due to pain, she relates her pain is uncontrolled.  CKD (chronic kidney disease) stage 3, GFR 30-59 ml/min - Continues to be at baseline, monitor.   Code Status: full Family Communication: none  Disposition Plan: obervation   Consultants:  none  Procedures:  CT abdomen and pelvis on 03/30/2014 show mild enhancement of the pancreas, extensive osteoporotic disease  Rib x-ray: Small pleural effusion with atelectasis on left  Thoracic spine x-ray: Multiple stable thoracic fractures  MRI of the spine: Compression number is mild thoracic spine compression fractures  Antibiotics:  None  HPI/Subjective: She relates her pain is uncontrolled, she every time she moves it bothers her.  Objective: Filed Vitals:   05/31/14 2322 06/01/14 0130 06/01/14 0550 06/01/14 0648  BP: 229/84 107/45 179/61 172/68  Pulse: 72 66 64 60  Temp: 98 F (36.7 C)  97.5 F (36.4 C)   TempSrc: Oral  Oral   Resp: Height:      Weight:      SpO2: 95%  94%    No intake or output data in the 24 hours ending 06/01/14 0828 Filed Weights   05/31/14 1747  Weight: 47.174 kg (104 lb)    Exam:  General: Alert, awake, oriented x3, in no acute distress.  HEENT: No bruits, no goiter.  Heart: Regular rate and rhythm. Lungs: Good air movement, clear.  Abdomen: Soft, nontender, nondistended, positive bowel sounds.  Neuro: Grossly intact, nonfocal.   Data Reviewed: Basic Metabolic Panel:  Recent Labs Lab 05/31/14 1826 05/31/14 2359 06/01/14 0510  NA 137  --  143  K 3.7  --  4.0  CL 102  --  101  CO2 27  --  30  GLUCOSE 122*  --  101*  BUN  27*  --  31*  CREATININE 1.14* 1.17* 1.35*  CALCIUM 8.8  --  9.1   Liver Function Tests:  Recent Labs Lab 05/31/14 1826 06/01/14 0510  AST 19 18  ALT 10 9  ALKPHOS 86 78  BILITOT 0.9 0.9  PROT 6.9 6.2  ALBUMIN 3.5 3.2*    Recent Labs Lab 05/31/14 2052 06/01/14 0510  LIPASE 17 17   No results for input(s): AMMONIA in the last 168 hours. CBC:  Recent Labs Lab 05/31/14 1826 05/31/14 2359 06/01/14 0510  WBC 9.1 8.1 7.5  NEUTROABS 6.5  --  5.0  HGB 11.3* 10.7* 10.9*  HCT 34.5* 32.0* 33.1*  MCV 96.6 96.7 97.6  PLT 230 227 241   Cardiac Enzymes:  Recent Labs Lab 05/31/14 2359  TROPONINI 0.03   BNP (last 3 results) No results for input(s): BNP in the last 8760 hours.  ProBNP (last 3 results) No results for input(s): PROBNP in the last 8760 hours.  CBG: No results for input(s): GLUCAP in the last 168 hours.  No results found for this or any previous visit (from the past 240 hour(s)).   Studies: Ct Abdomen Pelvis Wo Contrast  05/31/2014   CLINICAL DATA:  Right-sided back and abdominal pain, elevated blood pressure, history essential hypertension, pancreatitis, atrial fibrillation, acute on chronic renal failure  EXAM: CT ABDOMEN AND PELVIS WITHOUT CONTRAST  TECHNIQUE: Multidetector CT imaging of  the abdomen and pelvis was performed following the standard protocol without IV contrast. Sagittal and coronal MPR images reconstructed from axial data set.  COMPARISON:  04/07/2012  FINDINGS: Calcified granuloma LEFT lower lobe.  Tiny nodule at base of RIGHT major fissure image 5.  Bibasilar atelectasis.  Extensive atherosclerotic disease of coronary arteries, abdominal aorta, branch vessels and iliac systems.  Aneurysmal dilatation of a tortuous abdominal aorta, 3.6 cm AP image 23, 3.2 cm transverse on coronal image 39.  Tiny hyperdense nodules in the RIGHT kidney again seen suspect hyperdense cysts.  Small RIGHT lobe hepatic cyst 6 mm diameter image 32 with additional  lateral segment LEFT lobe lesion 17 x 11 mm image 22, likely an additional cyst.  Enlargement of pancreatic tail since previous exam with slightly indistinct margins raising question of distal pancreatitis.  Liver, spleen, kidneys and adrenal glands otherwise unremarkable.  Low lying cecum and RIGHT pelvis.  Sigmoid diverticulosis without definite evidence of diverticulitis.  LEFT inguinal hernia containing a nonobstructed small bowel loop.  Stomach and bowel loops otherwise unremarkable.  Tiny amount of fat at anterior bladder image 64.  No additional mass, adenopathy, free air or free fluid.  Bones demineralized with extensive compression deformities of thoracolumbar vertebrae.  IMPRESSION: Enlargement and slight ill definition of the pancreatic tail suspicious for acute pancreatitis; recommend correlation with serum amylase/lipase.  Hepatic and probable high attenuation RIGHT renal cysts.  Extensive atherosclerotic disease with mild aneurysmal dilatation of the mid abdominal aorta, maximum 3.6 x 3.2 cm.  LEFT inguinal hernia containing an unobstructed small bowel loop.  Osteoporosis with extensive compression deformities of the thoracolumbar spine.   Electronically Signed   By: Ulyses SouthwardMark  Boles M.D.   On: 05/31/2014 19:48   Dg Ribs Unilateral W/chest Right  05/31/2014   CLINICAL DATA:  Right upper back pain radiating into the mid and lower ribs in abdomen.  EXAM: RIGHT RIBS AND CHEST - 3+ VIEW  COMPARISON:  Single view of the chest 04/06/2012. PA and lateral chest 01/31/2011.  FINDINGS: The patient has a small left pleural effusion. There is no right pleural effusion. Bibasilar atelectasis is noted. Heart size is normal. Small focus of increased density along the right seventh rib likely represents overlapping shadows as it cannot be confirmed on other images. No rib fracture is identified.  IMPRESSION: Small left pleural effusion and basilar airspace disease, likely atelectasis.   Electronically Signed   By:  Drusilla Kannerhomas  Dalessio M.D.   On: 05/31/2014 22:28   Dg Thoracic Spine 2 View  05/31/2014   CLINICAL DATA:  RIGHT upper back/thoracic pain traveling to anterior mid to lower ribs and abdomen, no injury  EXAM: THORACIC SPINE - 2 VIEW  COMPARISON:  Chest two views 01/31/2011  FINDINGS: Osseous demineralization.  Twelve pairs of ribs.  Multiple thoracic compression fractures again identified.  Greatest degrees of compression are present at T6 and T8, unchanged.  New mild superior endplate compression deformities of lower thoracic vertebra including T11 and T12 as well as L1 and L2.  Minimal biconvex scoliosis.  Extensive atherosclerotic calcification aorta.  Visualized posterior ribs intact.  IMPRESSION: Multiple thoracic compression fractures, with relatively stable marked compression deformities at T6 and T8 as well as age-indeterminate but new superior endplate height losses at T11-L2.   Electronically Signed   By: Ulyses SouthwardMark  Boles M.D.   On: 05/31/2014 22:30    Scheduled Meds: . brimonidine  1 drop Both Eyes 3 times per day  . cloNIDine  0.1 mg Oral Daily  .  demeclocycline  150 mg Oral BID  . digoxin  0.0625 mg Oral Daily  . enoxaparin (LOVENOX) injection  30 mg Subcutaneous QHS  . fentaNYL  12.5 mcg Transdermal Q72H  . hydrALAZINE  50 mg Oral BID  . LORazepam  0.5 mg Oral Daily  . metoprolol tartrate  75 mg Oral BID  . mirtazapine  7.5 mg Oral QHS  . pantoprazole  40 mg Oral Daily  . potassium chloride  10 mEq Oral Daily  . sodium chloride  3 mL Intravenous Q12H   Continuous Infusions:    Marinda Elk  [facility] Pager [phone]. If 8PM-8AM, please contact night-coverage at [email or web address], password [facility] 06/01/2014, 8:28 AM  LOS: 1 day

## 2014-06-01 NOTE — Progress Notes (Signed)
CARE MANAGEMENT NOTE 06/01/2014  Patient:  Anne AuSTEVENS,Anne J   Account Number:  [id number]  Date Initiated:  06/01/2014  Documentation initiated by:  Ferdinand CavaSCHETTINO,Myson Levi  Subjective/Objective Assessment:   81 yo female admitted with abdominal pain     Action/Plan:   discharge planning   Anticipated DC Date:  06/02/2014   Anticipated DC Plan:  ASSISTED LIVING / REST HOME  In-house referral  Clinical Social Worker      DC Planning Services  CM consult      Choice offered to / List presented to:             Status of service:  Completed, signed off Medicare Important Message given?   (If response is "NO", the following Medicare IM given date fields will be blank) Date Medicare IM given:   Medicare IM given by:   Date Additional Medicare IM given:   Additional Medicare IM given by:    Discharge Disposition:    Per UR Regulation:    If discussed at Long Length of Stay Meetings, dates discussed:    Comments:  06/01/14 Ferdinand CavaAndrea Schettino RN BSN CM [phone] [facility] 24/7 caregivers. She uses a walker to ambulate. Patient stated that she would like to return the the ALF with 24/7 caregivers in place and be followed bu PT within the senior living community. CSW aware and [facility].

## 2014-06-01 NOTE — Progress Notes (Signed)
Clinical Social Work Department BRIEF PSYCHOSOCIAL ASSESSMENT 06/01/2014  Patient:  [name], [name]     Account Number:  [id number]     Admit date:  05/31/2014  Clinical Social Worker:  Earlie Server  Date/Time:  06/01/2014 10:45 AM  Referred by:  Physician  Date Referred:  06/01/2014 Referred for  ALF Placement   Other Referral:   Interview type:  Patient Other interview type:    PSYCHOSOCIAL DATA Living Status:  FACILITY Admitted from facility:  [facility] Level of care:  Assisted Living Primary support name:  Gwinda Passe Primary support relationship to patient:  CHILD, ADULT Degree of support available:   Adequate    CURRENT CONCERNS Current Concerns  Post-Acute Placement   Other Concerns:    SOCIAL WORK ASSESSMENT / PLAN CSW received referral in order to assist with DC planning. CSW reviewed chart and met with patient and caregiver at bedside. CSW introduced myself and explained role.    Patient reports she has been living at [facility] for about 3-4 years. Patient was living in independent living but moved into assisted living about 2 years ago. Patient reports she hires 24/7 care whenever she feels weak and currently has a Actuary in the room. Patient reports she wants to work with PT in order to determine if she needs a higher level of care. Patient agreeable to plans and has CSW contact information in case further needs arise.    CSW spoke with Truman Hayward at ALF who confirms patient is at ALF and can return if stable. ALF requests that CSW keep them updated in case skilled care is needed at DC. CSW completed FL2 and will continue to follow.   Assessment/plan status:  Psychosocial Support/Ongoing Assessment of Needs Other assessment/ plan:   Information/referral to community resources:   Will return to [facility] but level of care is undetermined    PATIENT'S/FAMILY'S RESPONSE TO PLAN OF CARE: Patient alert and oriented. Patient engaged  in assessment and reports she wants to feel better quickly so she can DC back home. Patient reports she is grateful that she lives at [facility] and feels they provide excellent care for her. Patient reports no family lives locally but she has a dtr in [place] and a granddtr in [place]. Patient reports that she is independent and hires her own help because she does not want to be a burden on her family. Patient wants to work with PT and speak with MD prior to making any decisions about level of care at DC.       [place], [place] [phone]

## 2014-06-01 NOTE — Progress Notes (Signed)
Rx Brief Lovenox note  Wt=47 kg, CrCl~22 ml/min ordered Lovenox 40mg  daily for DVT prophylaxis.  Rx adjusted dose to 30mg  daily in pt with CrCl <30 ml/min.  Thanks Lorenza EvangelistGreen, Blaire Palomino R 06/01/2014 4:48 AM

## 2014-06-01 NOTE — Progress Notes (Signed)
Clinical Social Work  CSW was informed that patient should be ready to DC tomorrow. Facility called inquiring if patient wanted ALF vs SNF placement. Patient only meeting observation status at this time so would have to pay $250/day. CSW met with patient and cousins at bedside. Patient reports she wants to return to ALF with 24/7 caregivers at DC. Patient is interested in therapy for PT/OT and reports that she wants to use the therapy at facility. CSW spoke with Truman Hayward at ALF who is aware and agreeable to plans.   CSW will continue to follow and assist with transfer to ALF tomorrow if medically stable.  [place], [place] [phone]

## 2014-06-02 DIAGNOSIS — M4854XA Collapsed vertebra, not elsewhere classified, thoracic region, initial encounter for fracture: Secondary | ICD-10-CM

## 2014-06-02 DIAGNOSIS — S32020D Wedge compression fracture of second lumbar vertebra, subsequent encounter for fracture with routine healing: Secondary | ICD-10-CM

## 2014-06-02 DIAGNOSIS — R1011 Right upper quadrant pain: Secondary | ICD-10-CM | POA: Diagnosis not present

## 2014-06-02 DIAGNOSIS — M8088XA Other osteoporosis with current pathological fracture, vertebra(e), initial encounter for fracture: Secondary | ICD-10-CM | POA: Diagnosis not present

## 2014-06-02 HISTORY — DX: Collapsed vertebra, not elsewhere classified, thoracic region, initial encounter for fracture: M48.54XA

## 2014-06-02 HISTORY — DX: Wedge compression fracture of second lumbar vertebra, subsequent encounter for fracture with routine healing: S32.020D

## 2014-06-02 MED ORDER — FENTANYL 25 MCG/HR TD PT72
25.0000 ug | MEDICATED_PATCH | TRANSDERMAL | Status: DC
Start: 2014-06-02 — End: 2014-06-02

## 2014-06-02 MED ORDER — FENTANYL 25 MCG/HR TD PT72: 25.0000 ug | MEDICATED_PATCH | TRANSDERMAL | Status: DC

## 2014-06-02 MED ORDER — POLYETHYLENE GLYCOL 3350 17 G PO PACK
17.0000 g | PACK | Freq: Two times a day (BID) | ORAL | Status: DC
  Administered 2014-06-02: 17 g via ORAL
  Filled 2014-06-02: qty 1

## 2014-06-02 MED ORDER — LORAZEPAM 0.5 MG PO TABS: 0.5000 mg | ORAL_TABLET | Freq: Three times a day (TID) | ORAL | Status: DC | PRN

## 2014-06-02 MED ORDER — HYDROCODONE-ACETAMINOPHEN 7.5-325 MG PO TABS: 1.0000 | ORAL_TABLET | Freq: Four times a day (QID) | ORAL | Status: DC | PRN

## 2014-06-02 MED ORDER — POLYETHYLENE GLYCOL 3350 17 G PO PACK: 17.0000 g | PACK | Freq: Every day | ORAL | Status: DC

## 2014-06-02 NOTE — Discharge Summary (Addendum)
Physician Discharge Summary  [name] ZOX:[id number] DOB: July 31, 1918 DOA: 05/31/2014  PCP: Kimber RelicGREEN, ARTHUR G, MD  Admit date: 05/31/2014 Discharge date: 06/02/2014  Time spent: 35 minutes  Recommendations for Outpatient Follow-up:  1. She'll go to facility with physical therapy 2. We'll continue her MiraLAX and make sure she is having bowel movements.  Discharge Diagnoses:  Principal Problem:   Compression fracture of L2 lumbar vertebra with routine healing Active Problems:   Hypothyroidism   Hypertensive urgency   CKD (chronic kidney disease) stage 3, GFR 30-59 ml/min   Pancreatitis   Compression fracture of thoracic vertebra, non-traumatic   Discharge Condition: stable  Diet recommendation: regular  Filed Weights   05/31/14 1747  Weight: 47.174 kg (104 lb)    History of present illness:  81 y.o. female with history of hypertension, chronic kidney disease stage III, chronic hyponatremia, history of tachyarrhythmias of unclear etiology on digoxin, history of stroke presents to the ER because of worsening pain in the right upper quadrant since today morning. Patient denies any fall or trauma. Patient's pain is only on moving but otherwise patient is pain-free. Patient states the pain is quite excruciating when it happens. Patient does not have any nausea vomiting diarrhea fever chills shortness of breath or chest pain. Labs were unremarkable. CT abdomen and pelvis done shows multiple thoracic compression fractures and pancreas shows possible pancreatitis. Patient has been admitted for further management of her pain. On exam patient presently is pain-free but states that minimal movement increases the pain. Patient is receiving fentanyl IV when necessary and patient is also on a fentanyl patch. In addition patient's blood pressures found to be elevated in the ER and was given patient's home medication following which patient blood pressure improved.  Hospital Course:  Right  upper quadrant Abdominal pain - Most likely due to thoracic compression fractures, she does have mild compression fracture of T11 and L2. - Discussed the case with neurosurgery Dr. Lovell SheehanJenkins and he recommended narcotics and a brace - Physical therapy evaluated the patient and recommended home  PT - Her pain was controlled with Vicodin and fentanyl.  Hypertensive urgency - There is most likely due to pain. - Continue current home regimen after pain was controlled her blood pressure improved.  CKD (chronic kidney disease) stage 3, GFR 30-59 ml/min - Continues to be at baseline, monitor.  Procedures:  MRI thoracic  Consultations:  none  Discharge Exam: Filed Vitals:   06/02/14 0745  BP: 178/68  Pulse: 66  Temp: 97.6 F (36.4 C)  Resp: 18    General: Awake alert and oriented 3 Cardiovascular: Regular rate and rhythm Respiratory: Good air movement and clear to auscultation  Discharge Instructions   Discharge Instructions    Diet - low sodium heart healthy    Complete by:  As directed      Increase activity slowly    Complete by:  As directed           Current Discharge Medication List    START taking these medications   Details  fentaNYL (DURAGESIC) 25 MCG/HR patch Place 1 patch (25 mcg total) onto the skin every 3 (three) days. Qty: 5 patch, Refills: 0    HYDROcodone-acetaminophen (NORCO) 7.5-325 MG per tablet Take 1 tablet by mouth every 6 (six) hours as needed for moderate pain. Qty: 30 tablet, Refills: 0    !! polyethylene glycol (MIRALAX) packet Take 17 g by mouth daily. Qty: 14 each, Refills: 0     !! -  Potential duplicate medications found. Please discuss with provider.    CONTINUE these medications which have NOT CHANGED   Details  acetaminophen (TYLENOL) 325 MG tablet Take 650 mg by mouth every 4 (four) hours as needed for mild pain.    brimonidine (ALPHAGAN) 0.2 % ophthalmic solution One drop both eyes twice daily    Cholecalciferol (VITAMIN D)  2000 UNITS tablet Take 2,000 Units by mouth every morning.    cloNIDine (CATAPRES) 0.1 MG tablet Take 1 tablet (0.1 mg total) by mouth daily. Qty: 60 tablet    demeclocycline (DECLOMYCIN) 150 MG tablet Take 150 mg by mouth 2 (two) times daily.    digoxin (LANOXIN) 0.125 MG tablet Take 0.125 mg by mouth daily. Take 1/2 tablet every morning * check pulse daily*    hydrALAZINE (APRESOLINE) 50 MG tablet Take 50 mg by mouth 2 (two) times daily.    KLOR-CON M10 10 MEQ tablet Take one tablet daily    LORazepam (ATIVAN) 0.5 MG tablet Take 0.5 mg by mouth. Take one tablet every morning; take one twice daily as needed for anxiety    magnesium hydroxide (MILK OF MAGNESIA) 400 MG/5ML suspension Take 30 mLs by mouth daily as needed. For constipation    metoprolol tartrate (LOPRESSOR) 25 MG tablet Take 3 tablets (75 mg total) by mouth 2 (two) times daily.    mirtazapine (REMERON SOL-TAB) 15 MG disintegrating tablet Take 7.5 mg by mouth at bedtime.    omeprazole (PRILOSEC) 20 MG capsule Take 20 mg by mouth every morning. * Do Not Crush* Take on a empty stomach.    ondansetron (ZOFRAN) 4 MG tablet Take 4 mg by mouth 3 (three) times daily as needed for nausea or vomiting.    !! polyethylene glycol (MIRALAX / GLYCOLAX) packet Take 17 g by mouth daily as needed for moderate constipation.    vitamin B-12 (CYANOCOBALAMIN) 500 MCG tablet Take 500 mcg by mouth every morning.    traMADol (ULTRAM) 50 MG tablet Take 1 tablet (50 mg total) by mouth daily. Qty: 30 tablet, Refills: 5     !! - Potential duplicate medications found. Please discuss with provider.    STOP taking these medications     fentaNYL (DURAGESIC - DOSED MCG/HR) 12 MCG/HR        Allergies  Allergen Reactions  . Codeine Other (See Comments)    Unknown reaction. Info from a MAR.   Follow-up Information    Follow up with GREEN, [name], MD In 2 weeks.   Specialty:  Internal Medicine   Why:  hospital follow up   Contact  information:   [address] Jeanella [place] [address] [phone]        The results of significant diagnostics from this hospitalization (including imaging, microbiology, ancillary and laboratory) are listed below for reference.    Significant Diagnostic Studies: Ct Abdomen Pelvis Wo Contrast  05/31/2014   CLINICAL DATA:  Right-sided back and abdominal pain, elevated blood pressure, history essential hypertension, pancreatitis, atrial fibrillation, acute on chronic renal failure  EXAM: CT ABDOMEN AND PELVIS WITHOUT CONTRAST  TECHNIQUE: Multidetector CT imaging of the abdomen and pelvis was performed following the standard protocol without IV contrast. Sagittal and coronal MPR images reconstructed from axial data set.  COMPARISON:  04/07/2012  FINDINGS: Calcified granuloma LEFT lower lobe.  Tiny nodule at base of RIGHT major fissure image 5.  Bibasilar atelectasis.  Extensive atherosclerotic disease of coronary arteries, abdominal aorta, branch vessels and iliac systems.  Aneurysmal dilatation of a  tortuous abdominal aorta, 3.6 cm AP image 23, 3.2 cm transverse on coronal image 39.  Tiny hyperdense nodules in the RIGHT kidney again seen suspect hyperdense cysts.  Small RIGHT lobe hepatic cyst 6 mm diameter image 32 with additional lateral segment LEFT lobe lesion 17 x 11 mm image 22, likely an additional cyst.  Enlargement of pancreatic tail since previous exam with slightly indistinct margins raising question of distal pancreatitis.  Liver, spleen, kidneys and adrenal glands otherwise unremarkable.  Low lying cecum and RIGHT pelvis.  Sigmoid diverticulosis without definite evidence of diverticulitis.  LEFT inguinal hernia containing a nonobstructed small bowel loop.  Stomach and bowel loops otherwise unremarkable.  Tiny amount of fat at anterior bladder image 64.  No additional mass, adenopathy, free air or free fluid.  Bones demineralized with extensive compression deformities of thoracolumbar  vertebrae.  IMPRESSION: Enlargement and slight ill definition of the pancreatic tail suspicious for acute pancreatitis; recommend correlation with serum amylase/lipase.  Hepatic and probable high attenuation RIGHT renal cysts.  Extensive atherosclerotic disease with mild aneurysmal dilatation of the mid abdominal aorta, maximum 3.6 x 3.2 cm.  LEFT inguinal hernia containing an unobstructed small bowel loop.  Osteoporosis with extensive compression deformities of the thoracolumbar spine.   Electronically Signed   By: Ulyses Southward M.D.   On: 05/31/2014 19:48   Dg Ribs Unilateral W/chest Right  05/31/2014   CLINICAL DATA:  Right upper back pain radiating into the mid and lower ribs in abdomen.  EXAM: RIGHT RIBS AND CHEST - 3+ VIEW  COMPARISON:  Single view of the chest 04/06/2012. PA and lateral chest 01/31/2011.  FINDINGS: The patient has a small left pleural effusion. There is no right pleural effusion. Bibasilar atelectasis is noted. Heart size is normal. Small focus of increased density along the right seventh rib likely represents overlapping shadows as it cannot be confirmed on other images. No rib fracture is identified.  IMPRESSION: Small left pleural effusion and basilar airspace disease, likely atelectasis.   Electronically Signed   By: Drusilla Kanner M.D.   On: 05/31/2014 22:28   Dg Thoracic Spine 2 View  05/31/2014   CLINICAL DATA:  RIGHT upper back/thoracic pain traveling to anterior mid to lower ribs and abdomen, no injury  EXAM: THORACIC SPINE - 2 VIEW  COMPARISON:  Chest two views 01/31/2011  FINDINGS: Osseous demineralization.  Twelve pairs of ribs.  Multiple thoracic compression fractures again identified.  Greatest degrees of compression are present at T6 and T8, unchanged.  New mild superior endplate compression deformities of lower thoracic vertebra including T11 and T12 as well as L1 and L2.  Minimal biconvex scoliosis.  Extensive atherosclerotic calcification aorta.  Visualized posterior  ribs intact.  IMPRESSION: Multiple thoracic compression fractures, with relatively stable marked compression deformities at T6 and T8 as well as age-indeterminate but new superior endplate height losses at T11-L2.   Electronically Signed   By: Ulyses Southward M.D.   On: 05/31/2014 22:30   Mr Thoracic Spine Wo Contrast  06/01/2014   CLINICAL DATA:  Back pain.  Compression fracture.  EXAM: MRI THORACIC SPINE WITHOUT CONTRAST  TECHNIQUE: Multiplanar, multisequence MR imaging of the thoracic spine was performed. No intravenous contrast was administered.  COMPARISON:  CT thoracic spine 12/03/2010  FINDINGS: Numerous compression fractures are seen throughout the thoracic spine. These appear benign and related to osteoporosis. No evidence of metastatic disease.  Negative for cord compression. No cord edema or cord lesion identified.  Mild chronic compression fracture T2, T3,  T4  Severe chronic compression fracture T6  Mild compression fracture T7 with diffuse bone marrow edema compatible with acute or subacute fracture. Small left-sided osteophyte on the left at C6-7 without significant spinal stenosis.  Severe chronic compression fracture T8.  Mild spondylosis at T8-9  Mild compression fracture T9 with bone marrow edema compatible with acute or subacute fracture.  Mild chronic fracture T10  Moderate compression fracture T11 with mild superior endplate edema. This is likely a late subacute fracture.  Moderately severe chronic compression fracture T12 with mild retropulsion of bone into the canal. No significant spinal stenosis  Moderately severe chronic compression fracture L1  Moderate to severe fracture of L2 with mild bone marrow edema. This is most likely a late subacute fracture  Moderate chronic compression fracture L3.  IMPRESSION: Numerous compression fractures throughout the thoracic spine which appear benign.  Mild compression fracture T7 which appears acute or subacute.  Mild compression fracture T9 which appears  acute or subacute  Compression fractures at T11 and L2 contain a mild amount of edema and may be late subacute fractures  No cord compression.   Electronically Signed   By: Marlan Palau M.D.   On: 06/01/2014 08:27    Microbiology: No results found for this or any previous visit (from the past 240 hour(s)).   Labs: Basic Metabolic Panel:  Recent Labs Lab 05/31/14 1826 05/31/14 2359 06/01/14 0510  NA 137  --  143  K 3.7  --  4.0  CL 102  --  101  CO2 27  --  30  GLUCOSE 122*  --  101*  BUN 27*  --  31*  CREATININE 1.14* 1.17* 1.35*  CALCIUM 8.8  --  9.1   Liver Function Tests:  Recent Labs Lab 05/31/14 1826 06/01/14 0510  AST 19 18  ALT 10 9  ALKPHOS 86 78  BILITOT 0.9 0.9  PROT 6.9 6.2  ALBUMIN 3.5 3.2*    Recent Labs Lab 05/31/14 2052 06/01/14 0510  LIPASE 17 17   No results for input(s): AMMONIA in the last 168 hours. CBC:  Recent Labs Lab 05/31/14 1826 05/31/14 2359 06/01/14 0510  WBC 9.1 8.1 7.5  NEUTROABS 6.5  --  5.0  HGB 11.3* 10.7* 10.9*  HCT 34.5* 32.0* 33.1*  MCV 96.6 96.7 97.6  PLT 230 227 241   Cardiac Enzymes:  Recent Labs Lab 05/31/14 2359  TROPONINI 0.03   BNP: BNP (last 3 results) No results for input(s): BNP in the last 8760 hours.  ProBNP (last 3 results) No results for input(s): PROBNP in the last 8760 hours.  CBG: No results for input(s): GLUCAP in the last 168 hours.     Signed:  Marinda Elk  [facility] 06/02/2014, 9:29 AM

## 2014-06-02 NOTE — Progress Notes (Signed)
Clinical Social Work  [facility] and spoke with Nedra HaiLee who reports patient can return. PT/OT orders were written on FL2 and ALF agreeable to arrange PT/OT once patient returns. CSW prepared DC packet with FL2, DC summary, PTAR forms, and hard scripts included. CSW informed patient and caregiver at bedside of DC plans. Patient reports she is happy to be going home today and that she is going to wear her back brace so that she can recovery quickly. Patient prefers PTAR for transportation because she wants to be comfortable on the ride. Patient reports she will update family and CSW does not need to contact anyone on her behalf. PTAR request #: [id number].  CSW is signing off but available if needed.  [place] Brid[place], [place] [phone]

## 2014-06-06 ENCOUNTER — Encounter: Payer: Self-pay | Admitting: Internal Medicine

## 2014-06-07 ENCOUNTER — Encounter (HOSPITAL_COMMUNITY): Payer: Self-pay | Admitting: Emergency Medicine

## 2014-06-07 ENCOUNTER — Inpatient Hospital Stay (HOSPITAL_COMMUNITY)
Admission: EM | Admit: 2014-06-07 | Discharge: 2014-06-10 | DRG: 640 | Disposition: A | Discharge status: Skilled Nursing Facility | Payer: Medicare Other | Attending: Internal Medicine | Admitting: Internal Medicine

## 2014-06-07 ENCOUNTER — Emergency Department (HOSPITAL_COMMUNITY): Payer: Medicare Other

## 2014-06-07 ENCOUNTER — Inpatient Hospital Stay (HOSPITAL_COMMUNITY): Payer: Medicare Other

## 2014-06-07 DIAGNOSIS — N179 Acute kidney failure, unspecified: Secondary | ICD-10-CM | POA: Diagnosis present

## 2014-06-07 DIAGNOSIS — K5900 Constipation, unspecified: Secondary | ICD-10-CM | POA: Diagnosis present

## 2014-06-07 DIAGNOSIS — I161 Hypertensive emergency: Secondary | ICD-10-CM

## 2014-06-07 DIAGNOSIS — E860 Dehydration: Secondary | ICD-10-CM | POA: Diagnosis present

## 2014-06-07 DIAGNOSIS — K219 Gastro-esophageal reflux disease without esophagitis: Secondary | ICD-10-CM | POA: Diagnosis present

## 2014-06-07 DIAGNOSIS — I714 Abdominal aortic aneurysm, without rupture, unspecified: Secondary | ICD-10-CM

## 2014-06-07 DIAGNOSIS — Z8673 Personal history of transient ischemic attack (TIA), and cerebral infarction without residual deficits: Secondary | ICD-10-CM

## 2014-06-07 DIAGNOSIS — R1011 Right upper quadrant pain: Secondary | ICD-10-CM | POA: Diagnosis not present

## 2014-06-07 DIAGNOSIS — E785 Hyperlipidemia, unspecified: Secondary | ICD-10-CM | POA: Diagnosis present

## 2014-06-07 DIAGNOSIS — E871 Hypo-osmolality and hyponatremia: Secondary | ICD-10-CM | POA: Diagnosis present

## 2014-06-07 DIAGNOSIS — M8448XA Pathological fracture, other site, initial encounter for fracture: Secondary | ICD-10-CM | POA: Diagnosis present

## 2014-06-07 DIAGNOSIS — N289 Disorder of kidney and ureter, unspecified: Secondary | ICD-10-CM

## 2014-06-07 DIAGNOSIS — R1084 Generalized abdominal pain: Secondary | ICD-10-CM

## 2014-06-07 DIAGNOSIS — N183 Chronic kidney disease, stage 3 (moderate): Secondary | ICD-10-CM | POA: Diagnosis present

## 2014-06-07 DIAGNOSIS — R109 Unspecified abdominal pain: Secondary | ICD-10-CM

## 2014-06-07 DIAGNOSIS — I129 Hypertensive chronic kidney disease with stage 1 through stage 4 chronic kidney disease, or unspecified chronic kidney disease: Secondary | ICD-10-CM | POA: Diagnosis present

## 2014-06-07 DIAGNOSIS — E43 Unspecified severe protein-calorie malnutrition: Secondary | ICD-10-CM | POA: Diagnosis present

## 2014-06-07 DIAGNOSIS — Z681 Body mass index (BMI) 19 or less, adult: Secondary | ICD-10-CM

## 2014-06-07 DIAGNOSIS — Z66 Do not resuscitate: Secondary | ICD-10-CM | POA: Diagnosis present

## 2014-06-07 DIAGNOSIS — Z90710 Acquired absence of both cervix and uterus: Secondary | ICD-10-CM

## 2014-06-07 HISTORY — DX: Anxiety disorder, unspecified: F41.9

## 2014-06-07 HISTORY — DX: Anemia, unspecified: D64.9

## 2014-06-07 HISTORY — DX: Disorder of kidney and ureter, unspecified: N28.9

## 2014-06-07 HISTORY — DX: Hyperlipidemia, unspecified: E78.5

## 2014-06-07 HISTORY — DX: Gastro-esophageal reflux disease without esophagitis: K21.9

## 2014-06-07 HISTORY — DX: Dorsalgia, unspecified: M54.9

## 2014-06-07 LAB — BASIC METABOLIC PANEL
BUN: 40 mg/dL — AB (ref 4–21)
Creatinine: 1.6 mg/dL — AB (ref 0.5–1.1)
GLUCOSE: 101 mg/dL
Potassium: 5.1 mmol/L (ref 3.4–5.3)
Sodium: 128 mmol/L — AB (ref 137–147)

## 2014-06-07 LAB — URINALYSIS, ROUTINE W REFLEX MICROSCOPIC
Bilirubin Urine: NEGATIVE
Glucose, UA: NEGATIVE mg/dL
Ketones, ur: NEGATIVE mg/dL
Nitrite: NEGATIVE
PH: 5.5 (ref 5.0–8.0)
Protein, ur: NEGATIVE mg/dL
SPECIFIC GRAVITY, URINE: 1.007 (ref 1.005–1.030)
Urobilinogen, UA: 0.2 mg/dL (ref 0.0–1.0)

## 2014-06-07 LAB — CBC WITH DIFFERENTIAL/PLATELET
Basophils Absolute: 0 10*3/uL (ref 0.0–0.1)
Basophils Relative: 0 % (ref 0–1)
EOS ABS: 0 10*3/uL (ref 0.0–0.7)
Eosinophils Relative: 0 % (ref 0–5)
HEMATOCRIT: 38.1 % (ref 36.0–46.0)
Hemoglobin: 12.7 g/dL (ref 12.0–15.0)
LYMPHS PCT: 16 % (ref 12–46)
Lymphs Abs: 1.8 10*3/uL (ref 0.7–4.0)
MCH: 31.8 pg (ref 26.0–34.0)
MCHC: 33.3 g/dL (ref 30.0–36.0)
MCV: 95.3 fL (ref 78.0–100.0)
Monocytes Absolute: 0.9 10*3/uL (ref 0.1–1.0)
Monocytes Relative: 8 % (ref 3–12)
Neutro Abs: 8.2 10*3/uL — ABNORMAL HIGH (ref 1.7–7.7)
Neutrophils Relative %: 76 % (ref 43–77)
Platelets: 240 10*3/uL (ref 150–400)
RBC: 4 MIL/uL (ref 3.87–5.11)
RDW: 14.1 % (ref 11.5–15.5)
WBC: 10.9 10*3/uL — ABNORMAL HIGH (ref 4.0–10.5)

## 2014-06-07 LAB — COMPREHENSIVE METABOLIC PANEL
ALBUMIN: 3.6 g/dL (ref 3.5–5.2)
ALK PHOS: 134 U/L — AB (ref 39–117)
ALT: 15 U/L (ref 0–35)
AST: 24 U/L (ref 0–37)
Anion gap: 12 (ref 5–15)
BILIRUBIN TOTAL: 0.7 mg/dL (ref 0.3–1.2)
BUN: 42 mg/dL — AB (ref 6–23)
CHLORIDE: 94 mmol/L — AB (ref 96–112)
CO2: 25 mmol/L (ref 19–32)
Calcium: 9.2 mg/dL (ref 8.4–10.5)
Creatinine, Ser: 1.54 mg/dL — ABNORMAL HIGH (ref 0.50–1.10)
GFR calc Af Amer: 32 mL/min — ABNORMAL LOW (ref >90)
GFR, EST NON AFRICAN AMERICAN: 28 mL/min — AB (ref >90)
Glucose, Bld: 113 mg/dL — ABNORMAL HIGH (ref 70–99)
POTASSIUM: 4.7 mmol/L (ref 3.5–5.1)
SODIUM: 131 mmol/L — AB (ref 135–145)
TOTAL PROTEIN: 6.9 g/dL (ref 6.0–8.3)

## 2014-06-07 LAB — HEPATIC FUNCTION PANEL
ALT: 12 U/L (ref 7–35)
AST: 25 U/L (ref 13–35)
Alkaline Phosphatase: 134 U/L — AB (ref 25–125)
Bilirubin, Total: 0.6 mg/dL

## 2014-06-07 LAB — URINE MICROSCOPIC-ADD ON

## 2014-06-07 LAB — I-STAT TROPONIN, ED: Troponin i, poc: 0.03 ng/mL (ref 0.00–0.08)

## 2014-06-07 LAB — CBC AND DIFFERENTIAL
HEMATOCRIT: 39 % (ref 36–46)
Hemoglobin: 12.9 g/dL (ref 12.0–16.0)
PLATELETS: 249 10*3/uL (ref 150–399)
WBC: 11.7 10^3/mL

## 2014-06-07 LAB — I-STAT CHEM 8, ED
BUN: 44 mg/dL — AB (ref 6–23)
CREATININE: 1.8 mg/dL — AB (ref 0.50–1.10)
Calcium, Ion: 1.03 mmol/L — ABNORMAL LOW (ref 1.13–1.30)
Chloride: 95 mmol/L — ABNORMAL LOW (ref 96–112)
Glucose, Bld: 116 mg/dL — ABNORMAL HIGH (ref 70–99)
HCT: 42 % (ref 36.0–46.0)
Hemoglobin: 14.3 g/dL (ref 12.0–15.0)
POTASSIUM: 4.7 mmol/L (ref 3.5–5.1)
Sodium: 128 mmol/L — ABNORMAL LOW (ref 135–145)
TCO2: 24 mmol/L (ref 0–100)

## 2014-06-07 LAB — MRSA PCR SCREENING: MRSA by PCR: NEGATIVE

## 2014-06-07 LAB — LIPASE, BLOOD: LIPASE: 19 U/L (ref 11–59)

## 2014-06-07 LAB — I-STAT CG4 LACTIC ACID, ED: Lactic Acid, Venous: 1.64 mmol/L (ref 0.5–2.0)

## 2014-06-07 MED ORDER — ACETAMINOPHEN 650 MG RE SUPP: 650.0000 mg | Freq: Four times a day (QID) | RECTAL | Status: DC | PRN

## 2014-06-07 MED ORDER — MIRTAZAPINE 7.5 MG PO TABS
7.5000 mg | ORAL_TABLET | Freq: Every day | ORAL | Status: DC
  Administered 2014-06-08: 22:00:00 via ORAL
  Administered 2014-06-09: 7.5 mg via ORAL
  Filled 2014-06-07 (×4): qty 1

## 2014-06-07 MED ORDER — LABETALOL HCL 5 MG/ML IV SOLN
10.0000 mg | Freq: Once | INTRAVENOUS | Status: AC
  Administered 2014-06-07: 10 mg via INTRAVENOUS
  Filled 2014-06-07: qty 4

## 2014-06-07 MED ORDER — ACETAMINOPHEN 325 MG PO TABS: 650.0000 mg | ORAL_TABLET | Freq: Four times a day (QID) | ORAL | Status: DC | PRN

## 2014-06-07 MED ORDER — FENTANYL 25 MCG/HR TD PT72
25.0000 ug | MEDICATED_PATCH | TRANSDERMAL | Status: DC
  Administered 2014-06-09: 25 ug via TRANSDERMAL
  Filled 2014-06-07: qty 1

## 2014-06-07 MED ORDER — SODIUM CHLORIDE 0.9 % IV BOLUS (SEPSIS)
500.0000 mL | Freq: Once | INTRAVENOUS | Status: AC
  Administered 2014-06-07: 500 mL via INTRAVENOUS

## 2014-06-07 MED ORDER — NITROGLYCERIN IN D5W 200-5 MCG/ML-% IV SOLN: 0.0000 ug/min | INTRAVENOUS | Status: DC

## 2014-06-07 MED ORDER — CHLORHEXIDINE GLUCONATE 0.12 % MT SOLN
15.0000 mL | Freq: Two times a day (BID) | OROMUCOSAL | Status: DC
  Administered 2014-06-08 – 2014-06-10 (×5): 15 mL via OROMUCOSAL
  Filled 2014-06-07 (×9): qty 15

## 2014-06-07 MED ORDER — SODIUM CHLORIDE 0.9 % IJ SOLN
3.0000 mL | Freq: Two times a day (BID) | INTRAMUSCULAR | Status: DC
  Administered 2014-06-08: via INTRAVENOUS
  Administered 2014-06-09 – 2014-06-10 (×2): 3 mL via INTRAVENOUS

## 2014-06-07 MED ORDER — HYDRALAZINE HCL 20 MG/ML IJ SOLN
5.0000 mg | INTRAMUSCULAR | Status: DC | PRN
  Administered 2014-06-08 – 2014-06-10 (×8): 5 mg via INTRAVENOUS
  Filled 2014-06-07 (×7): qty 1

## 2014-06-07 MED ORDER — POTASSIUM CHLORIDE CRYS ER 10 MEQ PO TBCR
10.0000 meq | EXTENDED_RELEASE_TABLET | Freq: Every day | ORAL | Status: DC
  Administered 2014-06-08 – 2014-06-10 (×3): 10 meq via ORAL
  Filled 2014-06-07 (×3): qty 1

## 2014-06-07 MED ORDER — PIPERACILLIN-TAZOBACTAM IN DEX 2-0.25 GM/50ML IV SOLN
2.2500 g | Freq: Four times a day (QID) | INTRAVENOUS | Status: DC
  Administered 2014-06-07 – 2014-06-08 (×3): 2.25 g via INTRAVENOUS
  Filled 2014-06-07 (×4): qty 50

## 2014-06-07 MED ORDER — MORPHINE SULFATE 2 MG/ML IJ SOLN
0.5000 mg | INTRAMUSCULAR | Status: DC | PRN
Start: 2014-06-07 — End: 2014-06-08
  Administered 2014-06-07: 0.5 mg via INTRAVENOUS
  Filled 2014-06-07: qty 1

## 2014-06-07 MED ORDER — HYDROCODONE-ACETAMINOPHEN 7.5-325 MG PO TABS
1.0000 | ORAL_TABLET | Freq: Four times a day (QID) | ORAL | Status: DC | PRN
Start: 2014-06-07 — End: 2014-06-10

## 2014-06-07 MED ORDER — ONDANSETRON HCL 4 MG PO TABS: 4.0000 mg | ORAL_TABLET | Freq: Three times a day (TID) | ORAL | Status: DC | PRN

## 2014-06-07 MED ORDER — POLYETHYLENE GLYCOL 3350 17 G PO PACK
17.0000 g | PACK | Freq: Every day | ORAL | Status: DC
  Filled 2014-06-07: qty 1

## 2014-06-07 MED ORDER — NICARDIPINE HCL IN NACL 20-0.86 MG/200ML-% IV SOLN
3.0000 mg/h | Freq: Once | INTRAVENOUS | Status: AC
  Administered 2014-06-07: 5 mg/h via INTRAVENOUS
  Filled 2014-06-07: qty 200

## 2014-06-07 MED ORDER — DIGOXIN 0.0625 MG HALF TABLET
0.0625 mg | ORAL_TABLET | Freq: Every day | ORAL | Status: DC
Start: 2014-06-08 — End: 2014-06-10
  Administered 2014-06-08 – 2014-06-10 (×3): 0.0625 mg via ORAL
  Filled 2014-06-07 (×5): qty 1

## 2014-06-07 MED ORDER — DEMECLOCYCLINE HCL 150 MG PO TABS
150.0000 mg | ORAL_TABLET | Freq: Two times a day (BID) | ORAL | Status: DC
  Administered 2014-06-08 – 2014-06-10 (×5): 150 mg via ORAL
  Filled 2014-06-07 (×8): qty 1

## 2014-06-07 MED ORDER — SODIUM CHLORIDE 0.9 % IV SOLN
INTRAVENOUS | Status: AC
  Administered 2014-06-07: 21:00:00 via INTRAVENOUS

## 2014-06-07 MED ORDER — METOPROLOL TARTRATE 50 MG PO TABS
75.0000 mg | ORAL_TABLET | Freq: Two times a day (BID) | ORAL | Status: DC
  Administered 2014-06-08 – 2014-06-10 (×5): 75 mg via ORAL
  Filled 2014-06-07 (×2): qty 3
  Filled 2014-06-07 (×2): qty 1
  Filled 2014-06-07: qty 3
  Filled 2014-06-07: qty 1
  Filled 2014-06-07: qty 3

## 2014-06-07 MED ORDER — ONDANSETRON HCL 4 MG/2ML IJ SOLN
4.0000 mg | Freq: Four times a day (QID) | INTRAMUSCULAR | Status: DC | PRN
  Administered 2014-06-10: 4 mg via INTRAVENOUS
  Filled 2014-06-07: qty 2

## 2014-06-07 MED ORDER — BRIMONIDINE TARTRATE 0.2 % OP SOLN
1.0000 [drp] | Freq: Two times a day (BID) | OPHTHALMIC | Status: DC
  Administered 2014-06-08 – 2014-06-10 (×5): 1 [drp] via OPHTHALMIC
  Filled 2014-06-07: qty 5

## 2014-06-07 MED ORDER — LORAZEPAM 0.5 MG PO TABS
0.5000 mg | ORAL_TABLET | Freq: Every morning | ORAL | Status: DC
  Administered 2014-06-08 – 2014-06-10 (×3): 0.5 mg via ORAL
  Filled 2014-06-07 (×3): qty 1

## 2014-06-07 MED ORDER — LORAZEPAM 0.5 MG PO TABS: 0.5000 mg | ORAL_TABLET | Freq: Two times a day (BID) | ORAL | Status: DC | PRN

## 2014-06-07 MED ORDER — FENTANYL CITRATE 0.05 MG/ML IJ SOLN
40.0000 ug | Freq: Once | INTRAMUSCULAR | Status: AC
  Administered 2014-06-07: 40 ug via INTRAVENOUS
  Filled 2014-06-07: qty 2

## 2014-06-07 MED ORDER — CETYLPYRIDINIUM CHLORIDE 0.05 % MT LIQD
7.0000 mL | Freq: Two times a day (BID) | OROMUCOSAL | Status: DC
  Administered 2014-06-08 – 2014-06-10 (×5): 7 mL via OROMUCOSAL

## 2014-06-07 MED ORDER — MAGNESIUM HYDROXIDE 400 MG/5ML PO SUSP: 30.0000 mL | Freq: Every day | ORAL | Status: DC | PRN

## 2014-06-07 MED ORDER — POLYETHYLENE GLYCOL 3350 17 G PO PACK: 17.0000 g | PACK | Freq: Every day | ORAL | Status: DC | PRN

## 2014-06-07 MED ORDER — HYDRALAZINE HCL 50 MG PO TABS
50.0000 mg | ORAL_TABLET | Freq: Two times a day (BID) | ORAL | Status: DC
  Administered 2014-06-08 – 2014-06-10 (×5): 50 mg via ORAL
  Filled 2014-06-07 (×7): qty 1

## 2014-06-07 NOTE — ED Provider Notes (Signed)
CSN: [id number]     Arrival date & time 06/07/14  1638 History   First MD Initiated Contact with Patient 06/07/14 1645     Chief Complaint  Patient presents with  . Abdominal Pain     (Consider location/radiation/quality/duration/timing/severity/associated sxs/prior Treatment) HPI Comments: 81 year-old female with history of high blood pressure, nonsmoker presents with abdominal pain since this morning. This is different than previous abdominal pain. Patient had a hard stool this morning and is passing gas. No vomiting or fevers. Radiation to the back and blood pressure elevated on arrival. Nothing improves the pain fairly constant. Patient lives at assisted living and caregiver with her. Patient difficulty describing the pain. No falls or injuries, hysterectomy history, no known aneurysm or gallbladder issues.  The history is provided by the patient.    Past Medical History  Diagnosis Date  . Hypertension   . Anxiety   . GERD (gastroesophageal reflux disease)   . Anemia   . Hyperlipidemia   . Back pain    Past Surgical History  Procedure Laterality Date  . Abdominal hysterectomy     Family History  Problem Relation Age of Onset  . Bleeding Disorder Father     "died of blood poisoning"   History  Substance Use Topics  . Smoking status: Never Smoker   . Smokeless tobacco: Never Used  . Alcohol Use: No   OB History    No data available     Review of Systems  Constitutional: Positive for appetite change. Negative for fever and chills.  HENT: Negative for congestion.   Eyes: Negative for visual disturbance.  Respiratory: Negative for shortness of breath.   Cardiovascular: Negative for chest pain.  Gastrointestinal: Positive for abdominal pain and constipation. Negative for vomiting and blood in stool.  Genitourinary: Negative for dysuria and flank pain.  Musculoskeletal: Positive for back pain. Negative for neck pain and neck stiffness.  Skin: Negative for rash.   Neurological: Negative for light-headedness and headaches.      Allergies  Codeine  Home Medications   Prior to Admission medications   Medication Sig Start Date End Date Taking? Authorizing Provider  acetaminophen (TYLENOL) 325 MG tablet Take 650 mg by mouth every 4 (four) hours as needed for mild pain or fever.   Yes Historical Provider, MD  brimonidine (ALPHAGAN) 0.2 % ophthalmic solution Place 1 drop into both eyes 2 (two) times daily.   Yes Historical Provider, MD  cholecalciferol (VITAMIN D) 1000 UNITS tablet Take 2,000 Units by mouth daily.   Yes Historical Provider, MD  cloNIDine (CATAPRES) 0.1 MG tablet Take 0.1 mg by mouth daily.   Yes Historical Provider, MD  cyanocobalamin 500 MCG tablet Take 500 mcg by mouth daily.   Yes Historical Provider, MD  demeclocycline (DECLOMYCIN) 150 MG tablet Take 150 mg by mouth 2 (two) times daily.   Yes Historical Provider, MD  digoxin (LANOXIN) 0.125 MG tablet Take 0.0625 mg by mouth daily.   Yes Historical Provider, MD  fentaNYL (DURAGESIC - DOSED MCG/HR) 25 MCG/HR patch Place 25 mcg onto the skin every 3 (three) days.   Yes Historical Provider, MD  hydrALAZINE (APRESOLINE) 50 MG tablet Take 50 mg by mouth 2 (two) times daily.   Yes Historical Provider, MD  HYDROcodone-acetaminophen (NORCO) 7.5-325 MG per tablet Take 1 tablet by mouth every 6 (six) hours as needed for moderate pain.   Yes Historical Provider, MD  LORazepam (ATIVAN) 0.5 MG tablet Take 0.5 mg by mouth every morning.  Yes Historical Provider, MD  LORazepam (ATIVAN) 0.5 MG tablet Take 0.5 mg by mouth 2 (two) times daily as needed for anxiety.   Yes Historical Provider, MD  magnesium hydroxide (MILK OF MAGNESIA) 400 MG/5ML suspension Take 30 mLs by mouth daily as needed for mild constipation.   Yes Historical Provider, MD  metoprolol tartrate (LOPRESSOR) 25 MG tablet Take 75 mg by mouth 2 (two) times daily.   Yes Historical Provider, MD  mirtazapine (REMERON) 7.5 MG tablet Take  7.5 mg by mouth at bedtime.   Yes Historical Provider, MD  omeprazole (PRILOSEC) 20 MG capsule Take 1 capsule by mouth every morning. 05/30/14  Yes Historical Provider, MD  ondansetron (ZOFRAN) 4 MG tablet Take 4 mg by mouth every 8 (eight) hours as needed for nausea or vomiting.   Yes Historical Provider, MD  polyethylene glycol (MIRALAX / GLYCOLAX) packet Take 17 g by mouth daily.   Yes Historical Provider, MD  polyethylene glycol (MIRALAX / GLYCOLAX) packet Take 17 g by mouth daily as needed.   Yes Historical Provider, MD  potassium chloride (K-DUR,KLOR-CON) 10 MEQ tablet Take 10 mEq by mouth daily.   Yes Historical Provider, MD  traMADol (ULTRAM) 50 MG tablet Take 50 mg by mouth daily.   Yes Historical Provider, MD   BP 165/52 mmHg  Pulse 69  Temp(Src) 97.5 F (36.4 C) (Oral)  Resp 31  Ht 5\' 5"  (1.651 m)  Wt 97 lb 7.1 oz (44.2 kg)  BMI 16.22 kg/m2  SpO2 99% Physical Exam  Constitutional: She is oriented to person, place, and time. She appears well-developed and well-nourished.  HENT:  Head: Normocephalic and atraumatic.  Eyes: Conjunctivae are normal. Right eye exhibits no discharge. Left eye exhibits no discharge.  Neck: Normal range of motion. Neck supple. No tracheal deviation present.  Cardiovascular: Normal rate and regular rhythm.   Pulmonary/Chest: Effort normal and breath sounds normal.  Abdominal: Soft. She exhibits no distension. There is tenderness (central and right upper abdomen). There is no guarding.  Musculoskeletal: She exhibits no edema.  Neurovascularly intact lower extremity is bilateral  Neurological: She is alert and oriented to person, place, and time.  Skin: Skin is warm. No rash noted.  Psychiatric: She has a normal mood and affect.  Nursing note and vitals reviewed.   ED Course  Procedures (including critical care time) CRITICAL CARE Performed by: Enid Skeens   Total critical care time: 35 min  Critical care time was exclusive of separately  billable procedures and treating other patients.  Critical care was necessary to treat or prevent imminent or life-threatening deterioration.  Critical care was time spent personally by me on the following activities: development of treatment plan with patient and/or surrogate as well as nursing, discussions with consultants, evaluation of patient's response to treatment, examination of patient, obtaining history from patient or surrogate, ordering and performing treatments and interventions, ordering and review of laboratory studies, ordering and review of radiographic studies, pulse oximetry and re-evaluation of patient's condition.  EMERGENCY DEPARTMENT ULTRASOUND  Study: Limited Retroperitoneal Ultrasound of the Abdominal Aorta.  INDICATIONS:Abdominal pain, Back pain and Age>55 Multiple views of the abdominal aorta were obtained in real-time from the diaphragmatic hiatus to the aortic bifurcation in transverse planes with a multi-frequency probe. PERFORMED BY: Myself IMAGES ARCHIVED?: Yes FINDINGS: Maximum aortic dimensions are 3.4 cm LIMITATIONS:  Bowel gas and Abdominal pain INTERPRETATION:  Abdominal aortic aneurysm present    Labs Review Labs Reviewed  COMPREHENSIVE METABOLIC PANEL - Abnormal; Notable for  the following:    Sodium 131 (*)    Chloride 94 (*)    Glucose, Bld 113 (*)    BUN 42 (*)    Creatinine, Ser 1.54 (*)    Alkaline Phosphatase 134 (*)    GFR calc non Af Amer 28 (*)    GFR calc Af Amer 32 (*)    All other components within normal limits  CBC WITH DIFFERENTIAL/PLATELET - Abnormal; Notable for the following:    WBC 10.9 (*)    Neutro Abs 8.2 (*)    All other components within normal limits  URINALYSIS, ROUTINE W REFLEX MICROSCOPIC - Abnormal; Notable for the following:    Hgb urine dipstick TRACE (*)    Leukocytes, UA MODERATE (*)    All other components within normal limits  I-STAT CHEM 8, ED - Abnormal; Notable for the following:    Sodium 128 (*)     Chloride 95 (*)    BUN 44 (*)    Creatinine, Ser 1.80 (*)    Glucose, Bld 116 (*)    Calcium, Ion 1.03 (*)    All other components within normal limits  MRSA PCR SCREENING  LIPASE, BLOOD  URINE MICROSCOPIC-ADD ON  CBC WITH DIFFERENTIAL/PLATELET  CBC  COMPREHENSIVE METABOLIC PANEL  I-STAT CG4 LACTIC ACID, ED  I-STAT TROPOININ, ED    Imaging Review Ct Abdomen Pelvis Wo Contrast  06/07/2014   CLINICAL DATA:  Nursing home patient with acute abdominal pain in the right upper quadrant.  EXAM: CT ABDOMEN AND PELVIS WITHOUT CONTRAST  TECHNIQUE: Multidetector CT imaging of the abdomen and pelvis was performed following the standard protocol without IV contrast.  COMPARISON:  None available  FINDINGS: Lower chest: Trace pleural effusions dependently. There is associated in minimal bibasilar atelectasis. Calcified left lower lobe granuloma noted, image 6. No pneumothorax. Atherosclerosis noted of the aorta and coronary vessels. Aneurysmal dilatation of the left ventricle noted. No hiatal hernia.  Abdomen: Limited exam with respiratory motion artifact and lack of IV and oral contrast.  2 cm hypodense probable cyst in the left hepatic lobe lateral segment, image 19. No biliary dilatation. Liver, gallbladder, biliary system, pancreas, spleen, and adrenal glands are within normal limits for age and noncontrast imaging.  The kidneys demonstrate an extrarenal pelvis on the right. No acute obstructive uropathy or hydronephrosis. No obstructing urinary tract 4 ureteral calculus appreciated. Right kidney demonstrates scattered small subcentimeter hyperdense lesions, suspect numerous hyperdense cysts.  Aorta is atherosclerotic and tortuous. Mild aneurysmal dilatation of the suprarenal aorta measuring 3.1 cm, image 20. No retroperitoneal hemorrhage.  Negative for bowel obstruction, dilatation, ileus, or free air.  No abdominal free fluid, fluid collection, hemorrhage, abscess, adenopathy.  Pelvis: Extensive iliac  atherosclerosis. Diverticulosis noted of the sigmoid colon. No acute inflammatory process. Prior hysterectomy noted. No pelvic free fluid, fluid collection, hemorrhage, abscess, or adenopathy. Right inguinal hernia noted containing loops of small bowel without associated obstruction. Pelvic calcifications consistent with venous phleboliths.  Diffuse osteopenia noted. Degenerative changes noted of the hips, pelvis and spine diffusely. multiple thoracic and lumbar compression fractures.  IMPRESSION: No definite acute intra-abdominal or pelvic finding by noncontrast imaging. Limited exam with motion artifact.  Trace pleural effusions and bibasilar atelectasis  Tortuous atherosclerotic aneurysmal abdominal aorta, maximal diameter 3.1 cm.  No acute obstructive uropathy or hydronephrosis. No obstructing urinary tract calculus.  Sigmoid diverticulosis  Left inguinal hernia containing small bowel but no evidence of incarceration or obstruction.   Electronically Signed   By: Judie Petit.  Shick  M.D.   On: 06/07/2014 18:22   [place] Abdomen Limited Ruq  06/07/2014   CLINICAL DATA:  Right upper quadrant pain for 1 day.  EXAM: US ABDOMEN LIMITED - RIGHT UPPER QUADRANT  COMPARISON:  CT earlier the same day.  FINDINGS: Gallbladder:  No gallstones or wall thickening visualized. No sonographic Murphy sign noted.  Common bile duct:  Diameter: 5 mm.  Liver:  Within the left hepatic lobe is a 1.1 x 1.3 x 1.1 cm cyst. Question punctate hepatic granuloma, image number 12 and 16. Within normal limits in parenchymal echogenicity.  Technologist notes patient had tenderness in the right upper quadrant pole scanning, however no frank sonographic Murphy sign.  IMPRESSION: 1. Normal sonographic appearance of the gallbladder. No biliary dilatation. 2. Cyst in the left hepatic lobe.   Electronically Signed   By: Rubye OaksMelanie  Ehinger M.D.   On: 06/07/2014 21:05     EKG Interpretation None      MDM   Final diagnoses:  Right sided abdominal pain   Hyponatremia  Acute abdominal pain  AAA (abdominal aortic aneurysm) without rupture  Hypertensive emergency  Acute renal failure, unspecified acute renal failure type    Elderly patient with worsening abdominal pain for the past 12 hours, discussed differential diagnosis including gallbladder, aneurysm, constipation, other. With blood pressure greater than 200 systolic age and abdominal pain rating to the back CT angiogram of the abdomen to look for dissection aneurysm ordered. Bedside ultrasound showed abdominal aortic aneurysm 3.4 cm, difficult views due to bowel gas, patient denies history of aneurysm. CT scan pending pain meds given.  Blood pressure remained significant elevated despite IV labetalol, Cardene drip ordered. CT scan results reviewed confirmed abdominal aortic aneurysm no obvious leak or hemorrhage. Discussed the case with [facility] hospitalist who agreed with admitting to step down for blood pressure management and possibly repeating CT with IV contrast pending clinical status and repeat creatinine.  The patients results and plan were reviewed and discussed.   Any x-rays performed were personally reviewed by myself.   Differential diagnosis were considered with the presenting HPI.  Medications  ondansetron (ZOFRAN) injection 4 mg (not administered)  morphine 2 MG/ML injection 0.5 mg (0.5 mg Intravenous Given 06/07/14 2121)  sodium chloride 0.9 % injection 3 mL (3 mLs Intravenous Not Given 06/07/14 2200)  0.9 %  sodium chloride infusion ( Intravenous New Bag/Given 06/07/14 2124)  acetaminophen (TYLENOL) tablet 650 mg (not administered)    Or  acetaminophen (TYLENOL) suppository 650 mg (not administered)  brimonidine (ALPHAGAN) 0.2 % ophthalmic solution 1 drop (not administered)  demeclocycline (DECLOMYCIN) tablet 150 mg (150 mg Oral Not Given 06/07/14 2200)  digoxin (LANOXIN) tablet 0.0625 mg (not administered)  fentaNYL (DURAGESIC - dosed mcg/hr) patch 25 mcg (not administered)   hydrALAZINE (APRESOLINE) tablet 50 mg (50 mg Oral Not Given 06/07/14 2200)  HYDROcodone-acetaminophen (NORCO) 7.5-325 MG per tablet 1 tablet (not administered)  LORazepam (ATIVAN) tablet 0.5 mg (not administered)  LORazepam (ATIVAN) tablet 0.5 mg (not administered)  metoprolol tartrate (LOPRESSOR) tablet 75 mg (75 mg Oral Not Given 06/07/14 2200)  mirtazapine (REMERON) tablet 7.5 mg (7.5 mg Oral Not Given 06/07/14 2200)  ondansetron (ZOFRAN) tablet 4 mg (not administered)  polyethylene glycol (MIRALAX / GLYCOLAX) packet 17 g (17 g Oral Not Given 06/07/14 2115)  polyethylene glycol (MIRALAX / GLYCOLAX) packet 17 g (not administered)  potassium chloride (K-DUR,KLOR-CON) CR tablet 10 mEq (not administered)  piperacillin-tazobactam (ZOSYN) IVPB 2.25 g (2.25 g Intravenous Transfusing/Transfer 06/07/14 2030)  chlorhexidine (PERIDEX)  0.12 % solution 15 mL (15 mLs Mouth Rinse Not Given 06/07/14 2100)  antiseptic oral rinse (CPC / CETYLPYRIDINIUM CHLORIDE 0.05%) solution 7 mL (not administered)  hydrALAZINE (APRESOLINE) injection 5 mg (5 mg Intravenous Given 06/08/14 0013)  sodium chloride 0.9 % bolus 500 mL (0 mLs Intravenous Stopped 06/07/14 1803)  fentaNYL (SUBLIMAZE) injection 40 mcg (40 mcg Intravenous Given 06/07/14 1713)  labetalol (NORMODYNE,TRANDATE) injection 10 mg (10 mg Intravenous Given 06/07/14 1810)  nicardipine (CARDENE)  in 0.86% saline IV infusion (0.1 mg/ml) (3 mg/hr Intravenous Transfusing/Transfer 06/07/14 2030)    Filed Vitals:   06/07/14 2225 06/07/14 2330 06/08/14 0010 06/08/14 0030  BP: 136/47 177/59 190/63 165/52  Pulse: 66 68 69   Temp:      TempSrc:      Resp: 33 26 31   Height:      Weight:      SpO2: 97% 99% 99%     Final diagnoses:  Right sided abdominal pain  Hyponatremia  Acute abdominal pain  AAA (abdominal aortic aneurysm) without rupture  Hypertensive emergency  Acute renal failure, unspecified acute renal failure type    Admission/ observation  were discussed with the admitting physician, patient and/or family and they are comfortable with the plan.    Enid Skeens, MD 06/08/14 [phone]

## 2014-06-07 NOTE — H&P (Signed)
[name] is an 81 y.o. female.    [name] (pcp)  lives at [facility]  Chief Complaint: abdominal pain HPI: 81 yo female with htn, hyperlipidemia c/o abdominal pain, sharp, ruq, and diffuse, starting this am. Pain was not improving and therefore sent to ED for evaluation.  +constipation.  Pt denies fever, chills, n/v, diarrhea, brbpr, black stool.  Pt had CT scan that was unrevealing as to the cause of her abdominal pain,  Pt had noted 3.1cm aneurysm.  No dissection seen.  Pt states pain is intermittent.  Pt will be admitted for w/up of abdominal pain.    Past Medical History  Diagnosis Date  . Hypertension   . Anxiety   . GERD (gastroesophageal reflux disease)   . Anemia   . Hyperlipidemia   . Back pain     Past Surgical History  Procedure Laterality Date  . Abdominal hysterectomy      Family History  Problem Relation Age of Onset  . Bleeding Disorder Father     "died of blood poisoning"   Social History:  reports that she has never smoked. She has never used smokeless tobacco. She reports that she does not drink alcohol or use illicit drugs.  Allergies:  Allergies  Allergen Reactions  . Codeine     Unknown.     (Not in a hospital admission)  Results for orders placed or performed during the hospital encounter of 06/07/14 (from the past 48 hour(s))  Comprehensive metabolic panel     Status: Abnormal   Collection Time: 06/07/14  4:51 PM  Result Value Ref Range   Sodium 131 (L) 135 - 145 mmol/L   Potassium 4.7 3.5 - 5.1 mmol/L   Chloride 94 (L) 96 - 112 mmol/L   CO2 25 19 - 32 mmol/L   Glucose, Bld 113 (H) 70 - 99 mg/dL   BUN 42 (H) 6 - 23 mg/dL   Creatinine, Ser 1.54 (H) 0.50 - 1.10 mg/dL   Calcium 9.2 8.4 - 10.5 mg/dL   Total Protein 6.9 6.0 - 8.3 g/dL   Albumin 3.6 3.5 - 5.2 g/dL   AST 24 0 - 37 U/L   ALT 15 0 - 35 U/L   Alkaline Phosphatase 134 (H) 39 - 117 U/L   Total Bilirubin 0.7 0.3 - 1.2 mg/dL   GFR calc non Af Amer 28 (L) >90  mL/min   GFR calc Af Amer 32 (L) >90 mL/min    Comment: (NOTE) The eGFR has been calculated using the CKD EPI equation. This calculation has not been validated in all clinical situations. eGFR's persistently <90 mL/min signify possible Chronic Kidney Disease.    Anion gap 12 5 - 15  CBC with Differential/Platelet     Status: Abnormal   Collection Time: 06/07/14  4:51 PM  Result Value Ref Range   WBC 10.9 (H) 4.0 - 10.5 K/uL   RBC 4.00 3.87 - 5.11 MIL/uL   Hemoglobin 12.7 12.0 - 15.0 g/dL   HCT 38.1 36.0 - 46.0 %   MCV 95.3 78.0 - 100.0 fL   MCH 31.8 26.0 - 34.0 pg   MCHC 33.3 30.0 - 36.0 g/dL   RDW 14.1 11.5 - 15.5 %   Platelets 240 150 - 400 K/uL   Neutrophils Relative % 76 43 - 77 %   Neutro Abs 8.2 (H) 1.7 - 7.7 K/uL   Lymphocytes Relative 16 12 - 46 %   Lymphs Abs 1.8 0.7 - 4.0 K/uL  Monocytes Relative 8 3 - 12 %   Monocytes Absolute 0.9 0.1 - 1.0 K/uL   Eosinophils Relative 0 0 - 5 %   Eosinophils Absolute 0.0 0.0 - 0.7 K/uL   Basophils Relative 0 0 - 1 %   Basophils Absolute 0.0 0.0 - 0.1 K/uL  Lipase, blood     Status: None   Collection Time: 06/07/14  4:51 PM  Result Value Ref Range   Lipase 19 11 - 59 U/L  I-stat chem 8, ed     Status: Abnormal   Collection Time: 06/07/14  5:17 PM  Result Value Ref Range   Sodium 128 (L) 135 - 145 mmol/L   Potassium 4.7 3.5 - 5.1 mmol/L   Chloride 95 (L) 96 - 112 mmol/L   BUN 44 (H) 6 - 23 mg/dL   Creatinine, Ser 1.80 (H) 0.50 - 1.10 mg/dL   Glucose, Bld 116 (H) 70 - 99 mg/dL   Calcium, Ion 1.03 (L) 1.13 - 1.30 mmol/L   TCO2 24 0 - 100 mmol/L   Hemoglobin 14.3 12.0 - 15.0 g/dL   HCT 42.0 36.0 - 46.0 %  I-Stat CG4 Lactic Acid, ED     Status: None   Collection Time: 06/07/14  5:18 PM  Result Value Ref Range   Lactic Acid, Venous 1.64 0.5 - 2.0 mmol/L  Urinalysis, Routine w reflex microscopic     Status: Abnormal   Collection Time: 06/07/14  5:40 PM  Result Value Ref Range   Color, Urine YELLOW YELLOW   APPearance CLEAR  CLEAR   Specific Gravity, Urine 1.007 1.005 - 1.030   pH 5.5 5.0 - 8.0   Glucose, UA NEGATIVE NEGATIVE mg/dL   Hgb urine dipstick TRACE (A) NEGATIVE   Bilirubin Urine NEGATIVE NEGATIVE   Ketones, ur NEGATIVE NEGATIVE mg/dL   Protein, ur NEGATIVE NEGATIVE mg/dL   Urobilinogen, UA 0.2 0.0 - 1.0 mg/dL   Nitrite NEGATIVE NEGATIVE   Leukocytes, UA MODERATE (A) NEGATIVE  Urine microscopic-add on     Status: None   Collection Time: 06/07/14  5:40 PM  Result Value Ref Range   Squamous Epithelial / LPF RARE RARE   WBC, UA 3-6 <3 WBC/hpf   RBC / HPF 0-2 <3 RBC/hpf   Bacteria, UA RARE RARE   Ct Abdomen Pelvis Wo Contrast  06/07/2014   CLINICAL DATA:  Nursing home patient with acute abdominal pain in the right upper quadrant.  EXAM: CT ABDOMEN AND PELVIS WITHOUT CONTRAST  TECHNIQUE: Multidetector CT imaging of the abdomen and pelvis was performed following the standard protocol without IV contrast.  COMPARISON:  None available  FINDINGS: Lower chest: Trace pleural effusions dependently. There is associated in minimal bibasilar atelectasis. Calcified left lower lobe granuloma noted, image 6. No pneumothorax. Atherosclerosis noted of the aorta and coronary vessels. Aneurysmal dilatation of the left ventricle noted. No hiatal hernia.  Abdomen: Limited exam with respiratory motion artifact and lack of IV and oral contrast.  2 cm hypodense probable cyst in the left hepatic lobe lateral segment, image 19. No biliary dilatation. Liver, gallbladder, biliary system, pancreas, spleen, and adrenal glands are within normal limits for age and noncontrast imaging.  The kidneys demonstrate an extrarenal pelvis on the right. No acute obstructive uropathy or hydronephrosis. No obstructing urinary tract 4 ureteral calculus appreciated. Right kidney demonstrates scattered small subcentimeter hyperdense lesions, suspect numerous hyperdense cysts.  Aorta is atherosclerotic and tortuous. Mild aneurysmal dilatation of the  suprarenal aorta measuring 3.1 cm, image 20. No retroperitoneal  hemorrhage.  Negative for bowel obstruction, dilatation, ileus, or free air.  No abdominal free fluid, fluid collection, hemorrhage, abscess, adenopathy.  Pelvis: Extensive iliac atherosclerosis. Diverticulosis noted of the sigmoid colon. No acute inflammatory process. Prior hysterectomy noted. No pelvic free fluid, fluid collection, hemorrhage, abscess, or adenopathy. Right inguinal hernia noted containing loops of small bowel without associated obstruction. Pelvic calcifications consistent with venous phleboliths.  Diffuse osteopenia noted. Degenerative changes noted of the hips, pelvis and spine diffusely. multiple thoracic and lumbar compression fractures.  IMPRESSION: No definite acute intra-abdominal or pelvic finding by noncontrast imaging. Limited exam with motion artifact.  Trace pleural effusions and bibasilar atelectasis  Tortuous atherosclerotic aneurysmal abdominal aorta, maximal diameter 3.1 cm.  No acute obstructive uropathy or hydronephrosis. No obstructing urinary tract calculus.  Sigmoid diverticulosis  Left inguinal hernia containing small bowel but no evidence of incarceration or obstruction.   Electronically Signed   By: Jerilynn Mages.  Shick M.D.   On: 06/07/2014 18:22    Review of Systems  Constitutional: Negative for fever, chills, weight loss, malaise/fatigue and diaphoresis.  HENT: Negative for congestion, ear discharge, ear pain, hearing loss, nosebleeds, sore throat and tinnitus.   Eyes: Negative for blurred vision, double vision, photophobia, pain, discharge and redness.  Respiratory: Negative for cough, hemoptysis, sputum production, shortness of breath, wheezing and stridor.   Cardiovascular: Negative for chest pain, palpitations, orthopnea, claudication, leg swelling and PND.  Gastrointestinal: Positive for abdominal pain and constipation. Negative for heartburn, nausea, vomiting, diarrhea, blood in stool and melena.   Genitourinary: Negative for dysuria, urgency, frequency, hematuria and flank pain.  Musculoskeletal: Negative for myalgias, back pain, joint pain, falls and neck pain.  Skin: Negative for itching and rash.  Neurological: Negative for dizziness, tingling, tremors, sensory change, speech change, focal weakness, seizures, loss of consciousness, weakness and headaches.  Endo/Heme/Allergies: Negative for environmental allergies and polydipsia. Does not bruise/bleed easily.  Psychiatric/Behavioral: Negative for depression, suicidal ideas, hallucinations, memory loss and substance abuse. The patient is not nervous/anxious and does not have insomnia.     Blood pressure 232/79, pulse 71, temperature 97.6 F (36.4 C), temperature source Oral, resp. rate 16, SpO2 92 %. Physical Exam  Constitutional: She is oriented to person, place, and time. She appears well-developed and well-nourished.  HENT:  Head: Normocephalic and atraumatic.  Eyes: Conjunctivae and EOM are normal. Pupils are equal, round, and reactive to light. No scleral icterus.  Neck: Normal range of motion. Neck supple. No JVD present. No tracheal deviation present. No thyromegaly present.  Cardiovascular: Normal rate and regular rhythm.  Exam reveals no gallop and no friction rub.   No murmur heard. Respiratory: Effort normal and breath sounds normal. No respiratory distress. She has no wheezes.  GI: Soft. Bowel sounds are normal. She exhibits no distension. There is tenderness. There is no rebound and no guarding.  Musculoskeletal: Normal range of motion. She exhibits no edema or tenderness.  Lymphadenopathy:    She has no cervical adenopathy.  Neurological: She is alert and oriented to person, place, and time. She has normal reflexes. She displays normal reflexes. No cranial nerve deficit. She exhibits normal muscle tone. Coordination normal.  Skin: Skin is warm and dry. No rash noted. No erythema. No pallor.  Psychiatric: She has a  normal mood and affect. Her behavior is normal. Judgment and thought content normal.     Assessment/Plan Abdominal pain ? etiology RUQ u/s Start on protonix 39m iv bid Cbc, cmp  Hyponatremia Hydrate with ns iv Check serum osm,  urine osm, urine sodium, tsh, cortisol  Renal insufficiency Check urine sodium, urine creatinine, urine eosinophils Hydrate with ns iv  Hypertension uncontrolled Hydralazine 50m iv q6h prn Nicardipine iv KJani Gravel2/23/2016, 7:19 PM

## 2014-06-07 NOTE — ED Notes (Signed)
Bed: WA06 Expected date:  Expected time:  Means of arrival:  Comments: Ems- 81 yo F, abd pain, HTN

## 2014-06-07 NOTE — ED Notes (Signed)
Pts blood being collected by RN who is placing an Ultrasound guided IV

## 2014-06-07 NOTE — ED Notes (Signed)
Ultrasound in process at bedside at this time. 

## 2014-06-07 NOTE — Progress Notes (Signed)
CSW met with pt at bedside. There was no family present. Pt was laying down during the interview. Pt confirms that she comes from [place] at [place]. Also, pt confirms that she presents to [facility] due to abdominal pain.  Pt states that she is able to complete her ADL's independently. However, she says that she does receive assistance with bathing while at the facility. Pt informed CSW that she feels a little better than when she initially came into the ED.  Willette Brace [phone] ED CSW 06/07/2014 8:31 PM

## 2014-06-07 NOTE — ED Notes (Signed)
Brought in by EMS from [facility] with c/o abdominal pain.  Pt reported RUQ abdominal pain, no N/V/D. Had normal BM this morning. Also hypertensive--- BP244/97 by EMS.  Was recently seen here for compression fracture.  Staff at the facility also reported "weakness" for the past week.

## 2014-06-07 NOTE — ED Notes (Signed)
Ultrasound completed at this time

## 2014-06-08 DIAGNOSIS — E43 Unspecified severe protein-calorie malnutrition: Secondary | ICD-10-CM | POA: Insufficient documentation

## 2014-06-08 DIAGNOSIS — M546 Pain in thoracic spine: Secondary | ICD-10-CM

## 2014-06-08 HISTORY — DX: Unspecified severe protein-calorie malnutrition: E43

## 2014-06-08 LAB — CBC
HCT: 34.4 % — ABNORMAL LOW (ref 36.0–46.0)
Hemoglobin: 11.4 g/dL — ABNORMAL LOW (ref 12.0–15.0)
MCH: 31.8 pg (ref 26.0–34.0)
MCHC: 33.1 g/dL (ref 30.0–36.0)
MCV: 95.8 fL (ref 78.0–100.0)
Platelets: 217 10*3/uL (ref 150–400)
RBC: 3.59 MIL/uL — AB (ref 3.87–5.11)
RDW: 14.4 % (ref 11.5–15.5)
WBC: 8.8 10*3/uL (ref 4.0–10.5)

## 2014-06-08 LAB — COMPREHENSIVE METABOLIC PANEL
ALT: 12 U/L (ref 0–35)
AST: 21 U/L (ref 0–37)
Albumin: 3.2 g/dL — ABNORMAL LOW (ref 3.5–5.2)
Alkaline Phosphatase: 115 U/L (ref 39–117)
Anion gap: 10 (ref 5–15)
BUN: 29 mg/dL — ABNORMAL HIGH (ref 6–23)
CALCIUM: 8.6 mg/dL (ref 8.4–10.5)
CO2: 25 mmol/L (ref 19–32)
Chloride: 100 mmol/L (ref 96–112)
Creatinine, Ser: 1 mg/dL (ref 0.50–1.10)
GFR calc Af Amer: 55 mL/min — ABNORMAL LOW (ref >90)
GFR calc non Af Amer: 47 mL/min — ABNORMAL LOW (ref >90)
Glucose, Bld: 89 mg/dL (ref 70–99)
Potassium: 4 mmol/L (ref 3.5–5.1)
SODIUM: 135 mmol/L (ref 135–145)
Total Bilirubin: 0.6 mg/dL (ref 0.3–1.2)
Total Protein: 6.2 g/dL (ref 6.0–8.3)

## 2014-06-08 LAB — CBC WITH DIFFERENTIAL/PLATELET
Basophils Absolute: 0 10*3/uL (ref 0.0–0.1)
Basophils Relative: 0 % (ref 0–1)
Eosinophils Absolute: 0 10*3/uL (ref 0.0–0.7)
Eosinophils Relative: 0 % (ref 0–5)
HEMATOCRIT: 33.4 % — AB (ref 36.0–46.0)
Hemoglobin: 10.9 g/dL — ABNORMAL LOW (ref 12.0–15.0)
LYMPHS PCT: 22 % (ref 12–46)
Lymphs Abs: 1.8 10*3/uL (ref 0.7–4.0)
MCH: 31.3 pg (ref 26.0–34.0)
MCHC: 32.6 g/dL (ref 30.0–36.0)
MCV: 96 fL (ref 78.0–100.0)
MONO ABS: 0.8 10*3/uL (ref 0.1–1.0)
MONOS PCT: 11 % (ref 3–12)
NEUTROS PCT: 67 % (ref 43–77)
Neutro Abs: 5.4 10*3/uL (ref 1.7–7.7)
Platelets: 215 10*3/uL (ref 150–400)
RBC: 3.48 MIL/uL — AB (ref 3.87–5.11)
RDW: 14.3 % (ref 11.5–15.5)
WBC: 8 10*3/uL (ref 4.0–10.5)

## 2014-06-08 MED ORDER — PIPERACILLIN-TAZOBACTAM 3.375 G IVPB
3.3750 g | Freq: Three times a day (TID) | INTRAVENOUS | Status: DC
  Administered 2014-06-08 – 2014-06-09 (×3): 3.375 g via INTRAVENOUS
  Filled 2014-06-08 (×2): qty 50

## 2014-06-08 MED ORDER — POLYETHYLENE GLYCOL 3350 17 G PO PACK
17.0000 g | PACK | Freq: Two times a day (BID) | ORAL | Status: DC
  Administered 2014-06-08 – 2014-06-09 (×3): 17 g via ORAL
  Filled 2014-06-08 (×7): qty 1

## 2014-06-08 MED ORDER — CYCLOBENZAPRINE HCL 5 MG PO TABS: 5.0000 mg | ORAL_TABLET | Freq: Three times a day (TID) | ORAL | Status: DC | PRN

## 2014-06-08 MED ORDER — BOOST / RESOURCE BREEZE PO LIQD
1.0000 | Freq: Three times a day (TID) | ORAL | Status: DC
  Administered 2014-06-08: 1 via ORAL
  Administered 2014-06-08: 0.9875 via ORAL
  Administered 2014-06-09 (×3): 1 via ORAL

## 2014-06-08 NOTE — Progress Notes (Signed)
CARE MANAGEMENT NOTE 06/08/2014  Patient:  [name],[name]   Account Number:  [id number]  Date Initiated:  06/08/2014  Documentation initiated by:  DAVIS,RHONDA  Subjective/Objective Assessment:   hyponatremia with nasuea and hddypertensive then hypotensive, bun and creat elevated poss early pna     Action/Plan:   [facility] [employer]   Anticipated DC Date:  06/11/2014   Anticipated DC Plan:  HOME/SELF CARE  In-house referral  NA      DC Planning Services  NA      [facility]  [facility]   Choice offered to / List presented to:  NA   DME arranged  NA      DME agency  NA     HH arranged  NA      HH agency  NA   Status of service:  In process, will continue to follow Medicare Important Message given?   (If response is "NO", the following Medicare IM given date fields will be blank) Date Medicare IM given:   Medicare IM given by:   Date Additional Medicare IM given:   Additional Medicare IM given by:    Discharge Disposition:    Per UR Regulation:  Reviewed for med. necessity/level of care/duration of stay  If discussed at Long Length of Stay Meetings, dates discussed:    Comments:  Feb. 24 2016/Rhonda L. Earlene Plateravis, RN, BSN, CCM. Case Management [employer] [phone] No discharge needs present of time of review.

## 2014-06-08 NOTE — Progress Notes (Signed)
INITIAL NUTRITION ASSESSMENT  DOCUMENTATION CODES Per approved criteria  -Severe malnutrition in the context of chronic illness  Pt meets criteria for severe MALNUTRITION in the context of chronic illness as evidenced by sever fat and muscle wasting.  INTERVENTION: - Once diet advanced, add Magic cup TID with meals, each supplement provides 290 kcal and 9 grams of protein - Resource Breeze po TID, each supplement provides 250 kcal and 9 grams of protein - RD will continue to monitor  NUTRITION DIAGNOSIS: Inadequate oral intake related to abdominal pain as evidenced by wt loss and poor po.   Goal: Pt to meet >/= 90% of their estimated nutrition needs   Monitor:  Weight trend, po intake, acceptance of supplements, labs  Reason for Assessment: Low BMI  81 y.o. female  Admitting Dx: <principal problem not specified>  ASSESSMENT: 81 yo female with htn, hyperlipidemia c/o abdominal pain, sharp, ruq, and diffuse, starting this am. Pain was not improving and therefore sent to ED for evaluation. +constipation.  - Spoke with caregiver and family member.  - Pt has not been eating well, but is tolerating clear liquids. Today she had jello, soup, juice and water for breakfast. She asked for ice cream. Current diet is clear liquids. Will order Magic Cup once diet advanced. Prior to admission she was having a varied appetite. Pt with 7 lb wt loss in the past month (significant for time frame.) - Labs reviewed  Nutrition Focused Physical Exam:  Subcutaneous Fat:  Orbital Region: severe wasting Upper Arm Region: severe wasting Thoracic and Lumbar Region: n/a  Muscle:  Temple Region: severe wasting Clavicle Bone Region: severe wasting Clavicle and Acromion Bone Region: severe wasting Scapular Bone Region: n/a Dorsal Hand: severe wasting Patellar Region: severe wasting Anterior Thigh Region: severe wasting Posterior Calf Region: severe wasting  Edema: none  Height: Ht Readings  from Last 1 Encounters:  06/07/14  (1.651 m)    Weight: Wt Readings from Last 1 Encounters:  06/07/14 97 lb 7.1 oz (44.2 kg)    Ideal Body Weight: 57 kg  % Ideal Body Weight: 78%  Wt Readings from Last 10 Encounters:  06/07/14 97 lb 7.1 oz (44.2 kg)  05/31/14 104 lb (47.174 kg)    Usual Body Weight: 104 lbs  % Usual Body Weight: 93%  BMI:  Body mass index is 16.22 kg/(m^2).  Estimated Nutritional Needs: Kcal: 1300-1500 Protein: 70-80 g Fluid: 1.5 L/day  Skin: Intact  Diet Order: Diet clear liquid  EDUCATION NEEDS: -No education needs identified at this time   Intake/Output Summary (Last 24 hours) at 06/08/14 1326 Last data filed at 06/08/14 1200  Gross per 24 hour  Intake 2101.67 ml  Output      2 ml  Net 2099.67 ml    Last BM: prior to admission   Labs:   Recent Labs Lab 06/07/14 1651 06/07/14 1717 06/08/14 0400  NA 131* 128* 135  K 4.7 4.7 4.0  CL 94* 95* 100  CO2 25  --  25  BUN 42* 44* 29*  CREATININE 1.54* 1.80* 1.00  CALCIUM 9.2  --  8.6  GLUCOSE 113* 116* 89    CBG (last 3)  No results for input(s): GLUCAP in the last 72 hours.  Scheduled Meds: . antiseptic oral rinse  7 mL Mouth Rinse q12n4p  . brimonidine  1 drop Both Eyes BID  . chlorhexidine  15 mL Mouth Rinse BID  . demeclocycline  150 mg Oral BID  . digoxin  0.0625  mg Oral Daily  . [START ON 06/09/2014] fentaNYL  25 mcg Transdermal Q72H  . hydrALAZINE  50 mg Oral BID  . LORazepam  0.5 mg Oral q morning - 10a  . metoprolol tartrate  75 mg Oral BID  . mirtazapine  7.5 mg Oral QHS  . piperacillin-tazobactam (ZOSYN)  IV  3.375 g Intravenous 3 times per day  . polyethylene glycol  17 g Oral BID  . potassium chloride  10 mEq Oral Daily  . sodium chloride  3 mL Intravenous Q12H    Continuous Infusions: . sodium chloride 100 mL/hr at 06/08/14 1200    Past Medical History  Diagnosis Date  . Hypertension   . Anxiety   . GERD (gastroesophageal reflux disease)   .  Anemia   . Hyperlipidemia   . Back pain     Past Surgical History  Procedure Laterality Date  . Abdominal hysterectomy      Emmaline KluverHaley Olivianna Higley MS, RD, LDN [phone]

## 2014-06-08 NOTE — Progress Notes (Signed)
Pt received from ED with Cardene gtt running at 5 mg/hr. No orders for Cardene gtt at that time. BP 140/48 HR 63. MD paged and made aware. Cardene turned off and PRN hydralazine ordered. Patient stable. Will continue to monitor.

## 2014-06-08 NOTE — Progress Notes (Signed)
PROGRESS NOTE  [name] ZOX:[id number] DOB: 05/23/1918 DOA: 06/07/2014 PCP: Kimber Relic, MD  HPI: 81 y.o. female with history of hypertension, chronic kidney disease stage III, chronic hyponatremia, history of tachyarrhythmias of unclear etiology on digoxin, history of stroke  Subjective / 24 H Interval events - complains of ongoing back pain irradiating on right flank and right abdomen  Assessment/Plan: Active Problems:   Abdominal pain   Hyponatremia   Renal insufficiency   Back pain/right upper quadrant pain - patient was hospitalized here for the same and discharged just last week. For some reason she has no new medical record number and these findings were not different chart. She underwent an MRI on 06/01/2014 which showed - " Numerous compression fractures throughout the thoracic spine which appear benign. Mild compression fracture T7 which appears acute or subacute. Mild compression fracture T9 which appears acute or subacute Compression fractures at T11 and L2 contain a mild amount of edema and may be late subacute fractures. No cord compression. "" - Dr. Robb Matar at that time discussed these findings with Dr. Lovell Sheehan from neurosurgery and recommended conservative management with pain control in a brace. After discharge, patient has been having ongoing back pain, poor by mouth intake and was readmitted to the hospital with dehydration - She tells me that her pain is about the same or it was last week  HTN - somewhat brittle, hypertensive to 200 systolic likely with pain contributing, dropping to 90s after her home medications  Hyponatremia - in the setting of dehydration, improved with normal saline  Acute renal failure on chronic kidney disease stage III - creatinine improved with hydration overnight  History of tacchyarrhythmias - continue digoxin    Diet: Diet clear liquid Fluids: NS 100 cc/h DVT Prophylaxis: SCD  Code Status: DNR Family Communication: d/w  daughter bedside  Disposition Plan: home when ready   Consultants:  None   Procedures:  None    Antibiotics  Anti-infectives    Start     Dose/Rate Route Frequency Ordered Stop   06/07/14 2200  demeclocycline (DECLOMYCIN) tablet 150 mg     150 mg Oral 2 times daily 06/07/14 2107     06/07/14 2000  piperacillin-tazobactam (ZOSYN) IVPB 2.25 g     2.25 g 100 mL/hr over 30 Minutes Intravenous Every 6 hours 06/07/14 1943         Studies  Ct Abdomen Pelvis Wo Contrast  06/07/2014   CLINICAL DATA:  Nursing home patient with acute abdominal pain in the right upper quadrant.  EXAM: CT ABDOMEN AND PELVIS WITHOUT CONTRAST  TECHNIQUE: Multidetector CT imaging of the abdomen and pelvis was performed following the standard protocol without IV contrast.  COMPARISON:  None available  FINDINGS: Lower chest: Trace pleural effusions dependently. There is associated in minimal bibasilar atelectasis. Calcified left lower lobe granuloma noted, image 6. No pneumothorax. Atherosclerosis noted of the aorta and coronary vessels. Aneurysmal dilatation of the left ventricle noted. No hiatal hernia.  Abdomen: Limited exam with respiratory motion artifact and lack of IV and oral contrast.  2 cm hypodense probable cyst in the left hepatic lobe lateral segment, image 19. No biliary dilatation. Liver, gallbladder, biliary system, pancreas, spleen, and adrenal glands are within normal limits for age and noncontrast imaging.  The kidneys demonstrate an extrarenal pelvis on the right. No acute obstructive uropathy or hydronephrosis. No obstructing urinary tract 4 ureteral calculus appreciated. Right kidney demonstrates scattered small subcentimeter hyperdense lesions, suspect numerous hyperdense cysts.  Aorta is  atherosclerotic and tortuous. Mild aneurysmal dilatation of the suprarenal aorta measuring 3.1 cm, image 20. No retroperitoneal hemorrhage.  Negative for bowel obstruction, dilatation, ileus, or free air.  No  abdominal free fluid, fluid collection, hemorrhage, abscess, adenopathy.  Pelvis: Extensive iliac atherosclerosis. Diverticulosis noted of the sigmoid colon. No acute inflammatory process. Prior hysterectomy noted. No pelvic free fluid, fluid collection, hemorrhage, abscess, or adenopathy. Right inguinal hernia noted containing loops of small bowel without associated obstruction. Pelvic calcifications consistent with venous phleboliths.  Diffuse osteopenia noted. Degenerative changes noted of the hips, pelvis and spine diffusely. multiple thoracic and lumbar compression fractures.  IMPRESSION: No definite acute intra-abdominal or pelvic finding by noncontrast imaging. Limited exam with motion artifact.  Trace pleural effusions and bibasilar atelectasis  Tortuous atherosclerotic aneurysmal abdominal aorta, maximal diameter 3.1 cm.  No acute obstructive uropathy or hydronephrosis. No obstructing urinary tract calculus.  Sigmoid diverticulosis  Left inguinal hernia containing small bowel but no evidence of incarceration or obstruction.   Electronically Signed   By: Judie PetitM.  Shick M.D.   On: 06/07/2014 18:22   [place] Abdomen Limited Ruq  06/07/2014   CLINICAL DATA:  Right upper quadrant pain for 1 day.  EXAM: US ABDOMEN LIMITED - RIGHT UPPER QUADRANT  COMPARISON:  CT earlier the same day.  FINDINGS: Gallbladder:  No gallstones or wall thickening visualized. No sonographic Murphy sign noted.  Common bile duct:  Diameter: 5 mm.  Liver:  Within the left hepatic lobe is a 1.1 x 1.3 x 1.1 cm cyst. Question punctate hepatic granuloma, image number 12 and 16. Within normal limits in parenchymal echogenicity.  Technologist notes patient had tenderness in the right upper quadrant pole scanning, however no frank sonographic Murphy sign.  IMPRESSION: 1. Normal sonographic appearance of the gallbladder. No biliary dilatation. 2. Cyst in the left hepatic lobe.   Electronically Signed   By: Rubye OaksMelanie  Ehinger M.D.   On: 06/07/2014 21:05     Objective  Filed Vitals:   06/08/14 0245 06/08/14 0300 06/08/14 0402 06/08/14 0610  BP: 161/48 122/37 160/73 170/46  Pulse:  69 76 71  Temp:   97.9 F (36.6 C)   TempSrc:   Axillary   Resp:  15 23 16   Height:      Weight:      SpO2:  99% 97% 100%    Intake/Output Summary (Last 24 hours) at 06/08/14 0724 Last data filed at 06/08/14 0600  Gross per 24 hour  Intake   1500 ml  Output      0 ml  Net   1500 ml   Filed Weights   06/07/14 2100  Weight: 44.2 kg (97 lb 7.1 oz)    Exam:  General:  Frail elderly woman  HEENT: no scleral icterus  Cardiovascular: RRR  Respiratory: CTA biL  Abdomen: soft, non tender, "discomfort" throughout  MSK/Extremities: no clubbing/cyanosis  Skin: no rashes  Neuro: non focal   Data Reviewed: Basic Metabolic Panel:  Recent Labs Lab 06/07/14 1651 06/07/14 1717 06/08/14 0400  NA 131* 128* 135  K 4.7 4.7 4.0  CL 94* 95* 100  CO2 25  --  25  GLUCOSE 113* 116* 89  BUN 42* 44* 29*  CREATININE 1.54* 1.80* 1.00  CALCIUM 9.2  --  8.6   Liver Function Tests:  Recent Labs Lab 06/07/14 1651 06/08/14 0400  AST 24 21  ALT 15 12  ALKPHOS 134* 115  BILITOT 0.7 0.6  PROT 6.9 6.2  ALBUMIN 3.6 3.2*  Recent Labs Lab 06/07/14 1651  LIPASE 19   CBC:  Recent Labs Lab 06/07/14 1651 06/07/14 1717 06/08/14 0400  WBC 10.9*  --  8.8  NEUTROABS 8.2*  --   --   HGB 12.7 14.3 11.4*  HCT 38.1 42.0 34.4*  MCV 95.3  --  95.8  PLT 240  --  217   CBG: No results for input(s): GLUCAP in the last 168 hours.  Recent Results (from the past 240 hour(s))  MRSA PCR Screening     Status: None   Collection Time: 06/07/14  9:09 PM  Result Value Ref Range Status   MRSA by PCR NEGATIVE NEGATIVE Final    Comment:        The GeneXpert MRSA Assay (FDA approved for NASAL specimens only), is one component of a comprehensive MRSA colonization surveillance program. It is not intended to diagnose MRSA infection nor to guide  or monitor treatment for MRSA infections.      Scheduled Meds: . antiseptic oral rinse  7 mL Mouth Rinse q12n4p  . brimonidine  1 drop Both Eyes BID  . chlorhexidine  15 mL Mouth Rinse BID  . demeclocycline  150 mg Oral BID  . digoxin  0.0625 mg Oral Daily  . [START ON 06/09/2014] fentaNYL  25 mcg Transdermal Q72H  . hydrALAZINE  50 mg Oral BID  . LORazepam  0.5 mg Oral q morning - 10a  . metoprolol tartrate  75 mg Oral BID  . mirtazapine  7.5 mg Oral QHS  . piperacillin-tazobactam (ZOSYN)  IV  2.25 g Intravenous Q6H  . polyethylene glycol  17 g Oral Daily  . potassium chloride  10 mEq Oral Daily  . sodium chloride  3 mL Intravenous Q12H   Continuous Infusions: . sodium chloride 100 mL/hr at 06/07/14 2124    Pamella Pert, MD [facility] Pager [phone]. If 7 PM - 7 AM, please contact night-coverage at [email or web address], password [facility] 06/08/2014, 7:24 AM  LOS: 1 day

## 2014-06-08 NOTE — Progress Notes (Signed)
PT Cancellation Note  Patient Details Name: Anne RunnerMargaret Lamy MRN: [id number] DOB: 1918-11-01   Cancelled Treatment:    Reason Eval/Treat Not Completed: Medical issues which prohibited therapy (BP has not been controlled per RN. currently 188/75 but has had meds. and suggests to hold until AM.)   Rada HayHill, Roberth Berling [name] 06/08/2014, 4:54 PM Blanchard KelchKaren Tashai Catino PT [phone]

## 2014-06-09 ENCOUNTER — Other Ambulatory Visit: Payer: Self-pay | Admitting: Nurse Practitioner

## 2014-06-09 DIAGNOSIS — K8500 Idiopathic acute pancreatitis without necrosis or infection: Secondary | ICD-10-CM

## 2014-06-09 DIAGNOSIS — E43 Unspecified severe protein-calorie malnutrition: Secondary | ICD-10-CM

## 2014-06-09 DIAGNOSIS — T148 Other injury of unspecified body region: Secondary | ICD-10-CM

## 2014-06-09 LAB — CBC
HEMATOCRIT: 35.8 % — AB (ref 36.0–46.0)
Hemoglobin: 11.7 g/dL — ABNORMAL LOW (ref 12.0–15.0)
MCH: 31.5 pg (ref 26.0–34.0)
MCHC: 32.7 g/dL (ref 30.0–36.0)
MCV: 96.2 fL (ref 78.0–100.0)
Platelets: 216 10*3/uL (ref 150–400)
RBC: 3.72 MIL/uL — AB (ref 3.87–5.11)
RDW: 14.6 % (ref 11.5–15.5)
WBC: 7.8 10*3/uL (ref 4.0–10.5)

## 2014-06-09 LAB — BASIC METABOLIC PANEL
ANION GAP: 9 (ref 5–15)
BUN: 21 mg/dL (ref 6–23)
CO2: 25 mmol/L (ref 19–32)
Calcium: 8.3 mg/dL — ABNORMAL LOW (ref 8.4–10.5)
Chloride: 100 mmol/L (ref 96–112)
Creatinine, Ser: 0.91 mg/dL (ref 0.50–1.10)
GFR calc Af Amer: 61 mL/min — ABNORMAL LOW (ref >90)
GFR calc non Af Amer: 53 mL/min — ABNORMAL LOW (ref >90)
Glucose, Bld: 93 mg/dL (ref 70–99)
Potassium: 3.4 mmol/L — ABNORMAL LOW (ref 3.5–5.1)
Sodium: 134 mmol/L — ABNORMAL LOW (ref 135–145)

## 2014-06-09 MED ORDER — POTASSIUM CHLORIDE 20 MEQ PO PACK: 40.0000 meq | PACK | Freq: Once | ORAL | Status: DC

## 2014-06-09 MED ORDER — POTASSIUM CHLORIDE CRYS ER 20 MEQ PO TBCR
40.0000 meq | EXTENDED_RELEASE_TABLET | Freq: Once | ORAL | Status: AC
  Administered 2014-06-09: 40 meq via ORAL
  Filled 2014-06-09: qty 2

## 2014-06-09 MED ORDER — HYDRALAZINE HCL 20 MG/ML IJ SOLN
10.0000 mg | Freq: Once | INTRAMUSCULAR | Status: AC
  Administered 2014-06-09: 10 mg via INTRAVENOUS
  Filled 2014-06-09: qty 1

## 2014-06-09 MED ORDER — LABETALOL HCL 5 MG/ML IV SOLN
5.0000 mg | Freq: Once | INTRAVENOUS | Status: AC
  Administered 2014-06-09: 5 mg via INTRAVENOUS
  Filled 2014-06-09: qty 4

## 2014-06-09 MED ORDER — GLYCERIN (LAXATIVE) 2.1 G RE SUPP
1.0000 | Freq: Once | RECTAL | Status: AC
  Administered 2014-06-09: 1 via RECTAL
  Filled 2014-06-09: qty 1

## 2014-06-09 NOTE — Evaluation (Signed)
Physical Therapy Evaluation Patient Details Name: [name] MRN: [id number] DOB: Feb 01, 1919 Today's Date: 06/09/2014   History of Present Illness  81 yo female with htn, hyperlipidemia admitted 2/23 with c/o abdominal pain, +constipation. Pt had CT scan that was unrevealing as to the cause of her abdominal pain, Pt had noted 3.1cm aneurysm. BP has  been very high with difficulty controlling.. recently diagnosed with multiple compression fractures  -T7 ,T11, L2., neurosurgery recommended a back brace  Clinical Impression  Patient's BP were low enough for  PT to evaluate patient this PM per RN. BP 140/59-pre, after in recliner-128/68.Marland Kitchen Patient tolerated well, oozing stool, assisted to [facility]. Patient  Has caregiver  At bedside who reports that caregivers have been recently hired as patient had required more assistance. Patient will benefit from PT to address problems listed in note below. If caregivers are available 24/7, then may return to ALF, if not,she could benefit from SNF. No family present at this time.  Follow Up Recommendations Home health PT;Supervision/Assistance - 24 hour;SNF (pt has 24/7 caregivr=ers currently)    Equipment Recommendations  None recommended by PT    Recommendations for Other Services       Precautions / Restrictions Precautions Precautions: Fall Precaution Comments: monitor BP, incontinence Required Braces or Orthoses:  (chart indicates was recommended to have a back brace for compression fractures, recently. no brace present in room.)      Mobility  Bed Mobility Overal bed mobility: Needs Assistance Bed Mobility: Rolling;Sidelying to Sit Rolling: Mod assist Sidelying to sit: Mod assist       General bed mobility comments: assist for lifting trunk into upright with mod assist.  Transfers Overall transfer level: Needs assistance Equipment used: Rolling walker (2 wheeled) Transfers: Sit to/from [employer] Sit to Stand: Mod  assist;+2 safety/equipment Stand pivot transfers: Mod assist;+2 safety/equipment       General transfer comment: stood from bed and  BSC multiple times with multimodal cues, shuffle steps to  pivot to BSCwith RW, assist to manage Rw, support for standing balance, then stood for [facility] to be removed and recliner brought up.had recliner switched out under patient   Ambulation/Gait                Stairs            Wheelchair Mobility    Modified Rankin (Stroke Patients Only)       Balance Overall balance assessment: Needs assistance Sitting-balance support: Bilateral upper extremity supported;Feet supported Sitting balance-Leahy Scale: Fair                                       Pertinent Vitals/Pain Pain Assessment: Faces Faces Pain Scale: Hurts little more Pain Location: indicates abdomen, ankles, feet Pain Descriptors / Indicators: Cramping;Discomfort Pain Intervention(s): Limited activity within patient's tolerance;Monitored during session;Repositioned    Home Living Family/patient expects to be discharged to:: Assisted living   Available Help at Discharge: Personal care attendant;Available 24 hours/day           Home Equipment: Walker - 4 wheels;Bedside commode Additional Comments: recently has had 24/7 caregiver coverage.    Prior Function Level of Independence: Needs assistance   Gait / Transfers Assistance Needed: before recnt decline was ambulatory with RW  at ALF.           Hand Dominance        Extremity/Trunk Assessment   Upper  Extremity Assessment: Generalized weakness           Lower Extremity Assessment: Generalized weakness      Cervical / Trunk Assessment: Kyphotic  Communication      Cognition Arousal/Alertness: Awake/alert Behavior During Therapy: Anxious Overall Cognitive Status: Difficult to assess Area of Impairment: Orientation Orientation Level: Situation;Time                  General  Comments      Exercises        Assessment/Plan    PT Assessment Patient needs continued PT services  PT Diagnosis Difficulty walking;Generalized weakness;Acute pain   PT Problem List Decreased strength;Decreased activity tolerance;Decreased mobility;Decreased knowledge of precautions;Decreased safety awareness;Decreased knowledge of use of DME;Pain;Decreased cognition  PT Treatment Interventions DME instruction;Gait training;Functional mobility training;Therapeutic activities;Therapeutic exercise   PT Goals (Current goals can be found in the Care Plan section) Acute Rehab PT Goals Patient Stated Goal: agreed to getting up.. PT Goal Formulation: Patient unable to participate in goal setting Time For Goal Achievement: 06/23/14 Potential to Achieve Goals: Good    Frequency Min 3X/week   Barriers to discharge        Co-evaluation               End of Session   Activity Tolerance: Patient limited by fatigue Patient left: in chair;with call bell/phone within reach;with nursing/sitter in room Nurse Communication: Mobility status         Time: [id number] PT Time Calculation (min) (ACUTE ONLY): 30 min   Charges:   PT Evaluation $Initial PT Evaluation Tier I: 1 Procedure PT Treatments $Therapeutic Activity: 8-22 mins   PT G Codes:        Rada Bridges, Anne Moctezuma [name] 06/09/2014, 4:30 PM Blanchard KelchKaren Maida Bridges PT [phone]

## 2014-06-09 NOTE — Progress Notes (Signed)
PROGRESS NOTE  [name] ONG:[id number] DOB: October 09, 1918 DOA: 06/07/2014 PCP: Kimber Relic, MD  HPI: 81 y.o. female with history of hypertension, chronic kidney disease stage III, chronic hyponatremia, history of tachyarrhythmias of unclear etiology on digoxin, history of stroke  Subjective / 24 H Interval events - less pain this morning  Assessment/Plan: Active Problems:   Abdominal pain   Hyponatremia   Renal insufficiency   Protein-calorie malnutrition, severe   Back pain/right upper quadrant pain / T spine compression fractures  - patient was hospitalized here for the same and discharged just last week, under a different MRN. She underwent an MRI on 06/01/2014 which showed " Numerous compression fractures throughout the thoracic spine which appear benign. Mild compression fracture T7 which appears acute or subacute. Mild compression fracture T9 which appears acute or subacute Compression fractures at T11 and L2 contain a mild amount of edema and may be late subacute fractures. No cord compression. "" - Dr. Robb Matar at that time discussed these findings with Dr. Lovell Sheehan from neurosurgery and recommended conservative management with pain control in a brace. After discharge, patient has been having ongoing back pain, poor by mouth intake and was readmitted to the hospital with dehydration - encourage working with PT - no significant abdominal pain overall but mostly referred from back, worse with movement, clinically less likely intra-abdominal infection, discontinue Zosyn  HTN - somewhat brittle, hypertensive to 200 systolic likely with pain contributing - SBP 250? this morning, patient asymptomatic - continue home medications, will check manual BPs if BP readings are high  Hyponatremia - in the setting of dehydration, improved with normal saline  Acute renal failure on chronic kidney disease stage III - creatinine improved with hydration  History of tacchyarrhythmias -  continue digoxin    Diet: Diet clear liquid Fluids: NS 100 cc/h DVT Prophylaxis: SCD  Code Status: DNR Family Communication: d/w daughter bedside  Disposition Plan: home when ready   Consultants:  None   Procedures:  None    Antibiotics  Anti-infectives    Start     Dose/Rate Route Frequency Ordered Stop   06/08/14 1400  piperacillin-tazobactam (ZOSYN) IVPB 3.375 g     3.375 g 12.5 mL/hr over 240 Minutes Intravenous 3 times per day 06/08/14 1153     06/07/14 2200  demeclocycline (DECLOMYCIN) tablet 150 mg     150 mg Oral 2 times daily 06/07/14 2107     06/07/14 2000  piperacillin-tazobactam (ZOSYN) IVPB 2.25 g  Status:  Discontinued     2.25 g 100 mL/hr over 30 Minutes Intravenous Every 6 hours 06/07/14 1943 06/08/14 1152       Studies  Ct Abdomen Pelvis Wo Contrast  06/07/2014   CLINICAL DATA:  Nursing home patient with acute abdominal pain in the right upper quadrant.  EXAM: CT ABDOMEN AND PELVIS WITHOUT CONTRAST  TECHNIQUE: Multidetector CT imaging of the abdomen and pelvis was performed following the standard protocol without IV contrast.  COMPARISON:  None available  FINDINGS: Lower chest: Trace pleural effusions dependently. There is associated in minimal bibasilar atelectasis. Calcified left lower lobe granuloma noted, image 6. No pneumothorax. Atherosclerosis noted of the aorta and coronary vessels. Aneurysmal dilatation of the left ventricle noted. No hiatal hernia.  Abdomen: Limited exam with respiratory motion artifact and lack of IV and oral contrast.  2 cm hypodense probable cyst in the left hepatic lobe lateral segment, image 19. No biliary dilatation. Liver, gallbladder, biliary system, pancreas, spleen, and adrenal glands are within normal  limits for age and noncontrast imaging.  The kidneys demonstrate an extrarenal pelvis on the right. No acute obstructive uropathy or hydronephrosis. No obstructing urinary tract 4 ureteral calculus appreciated. Right kidney  demonstrates scattered small subcentimeter hyperdense lesions, suspect numerous hyperdense cysts.  Aorta is atherosclerotic and tortuous. Mild aneurysmal dilatation of the suprarenal aorta measuring 3.1 cm, image 20. No retroperitoneal hemorrhage.  Negative for bowel obstruction, dilatation, ileus, or free air.  No abdominal free fluid, fluid collection, hemorrhage, abscess, adenopathy.  Pelvis: Extensive iliac atherosclerosis. Diverticulosis noted of the sigmoid colon. No acute inflammatory process. Prior hysterectomy noted. No pelvic free fluid, fluid collection, hemorrhage, abscess, or adenopathy. Right inguinal hernia noted containing loops of small bowel without associated obstruction. Pelvic calcifications consistent with venous phleboliths.  Diffuse osteopenia noted. Degenerative changes noted of the hips, pelvis and spine diffusely. multiple thoracic and lumbar compression fractures.  IMPRESSION: No definite acute intra-abdominal or pelvic finding by noncontrast imaging. Limited exam with motion artifact.  Trace pleural effusions and bibasilar atelectasis  Tortuous atherosclerotic aneurysmal abdominal aorta, maximal diameter 3.1 cm.  No acute obstructive uropathy or hydronephrosis. No obstructing urinary tract calculus.  Sigmoid diverticulosis  Left inguinal hernia containing small bowel but no evidence of incarceration or obstruction.   Electronically Signed   By: Judie Petit.  Shick M.D.   On: 06/07/2014 18:22   US Abdomen Limited Ruq  06/07/2014   CLINICAL DATA:  Right upper quadrant pain for 1 day.  EXAM: US ABDOMEN LIMITED - RIGHT UPPER QUADRANT  COMPARISON:  CT earlier the same day.  FINDINGS: Gallbladder:  No gallstones or wall thickening visualized. No sonographic Murphy sign noted.  Common bile duct:  Diameter: 5 mm.  Liver:  Within the left hepatic lobe is a 1.1 x 1.3 x 1.1 cm cyst. Question punctate hepatic granuloma, image number 12 and 16. Within normal limits in parenchymal echogenicity.  Technologist  notes patient had tenderness in the right upper quadrant pole scanning, however no frank sonographic Murphy sign.  IMPRESSION: 1. Normal sonographic appearance of the gallbladder. No biliary dilatation. 2. Cyst in the left hepatic lobe.   Electronically Signed   By: Rubye Oaks M.D.   On: 06/07/2014 21:05    Objective  Filed Vitals:   06/09/14 0652 06/09/14 0800 06/09/14 0950 06/09/14 1200  BP: 181/77 263/95 133/50 143/49  Pulse: 77 76 62 58  Temp:  98.4 F (36.9 C)  97.6 F (36.4 C)  TempSrc:  Oral  Oral  Resp: Height:      Weight:      SpO2: 95% 96% 96% 94%    Intake/Output Summary (Last 24 hours) at 06/09/14 1411 Last data filed at 06/09/14 0616  Gross per 24 hour  Intake    650 ml  Output      0 ml  Net    650 ml   Filed Weights   06/07/14 2100  Weight: 44.2 kg (97 lb 7.1 oz)    Exam:  General:  Frail elderly woman  HEENT: no scleral icterus  Cardiovascular: RRR  Respiratory: CTA biL  Abdomen: soft, non tender, "discomfort" throughout  MSK/Extremities: no clubbing/cyanosis  Skin: no rashes  Neuro: non focal   Data Reviewed: Basic Metabolic Panel:  Recent Labs Lab 06/07/14 1651 06/07/14 1717 06/08/14 0400 06/09/14 0354  NA 131* 128* 135 134*  K 4.7 4.7 4.0 3.4*  CL 94* 95* 100 100  CO2 25  --  25 25  GLUCOSE 113* 116* 89 93  BUN 42* 44* 29* 21  CREATININE 1.54* 1.80* 1.00 0.91  CALCIUM 9.2  --  8.6 8.3*   Liver Function Tests:  Recent Labs Lab 06/07/14 1651 06/08/14 0400  AST 24 21  ALT 15 12  ALKPHOS 134* 115  BILITOT 0.7 0.6  PROT 6.9 6.2  ALBUMIN 3.6 3.2*    Recent Labs Lab 06/07/14 1651  LIPASE 19   CBC:  Recent Labs Lab 06/07/14 1651 06/07/14 1717 06/08/14 0400 06/08/14 0927 06/09/14 0354  WBC 10.9*  --  8.8 8.0 7.8  NEUTROABS 8.2*  --   --  5.4  --   HGB 12.7 14.3 11.4* 10.9* 11.7*  HCT 38.1 42.0 34.4* 33.4* 35.8*  MCV 95.3  --  95.8 96.0 96.2  PLT 240  --  217 215 216   CBG: No results  for input(s): GLUCAP in the last 168 hours.  Recent Results (from the past 240 hour(s))  MRSA PCR Screening     Status: None   Collection Time: 06/07/14  9:09 PM  Result Value Ref Range Status   MRSA by PCR NEGATIVE NEGATIVE Final    Comment:        The GeneXpert MRSA Assay (FDA approved for NASAL specimens only), is one component of a comprehensive MRSA colonization surveillance program. It is not intended to diagnose MRSA infection nor to guide or monitor treatment for MRSA infections.      Scheduled Meds: . antiseptic oral rinse  7 mL Mouth Rinse q12n4p  . brimonidine  1 drop Both Eyes BID  . chlorhexidine  15 mL Mouth Rinse BID  . demeclocycline  150 mg Oral BID  . digoxin  0.0625 mg Oral Daily  . feeding supplement (RESOURCE BREEZE)  1 Container Oral TID BM  . fentaNYL  25 mcg Transdermal Q72H  . Glycerin (Adult)  1 suppository Rectal Once  . hydrALAZINE  50 mg Oral BID  . LORazepam  0.5 mg Oral q morning - 10a  . metoprolol tartrate  75 mg Oral BID  . mirtazapine  7.5 mg Oral QHS  . piperacillin-tazobactam (ZOSYN)  IV  3.375 g Intravenous 3 times per day  . polyethylene glycol  17 g Oral BID  . potassium chloride  10 mEq Oral Daily  . sodium chloride  3 mL Intravenous Q12H   Continuous Infusions:    Pamella Pertostin Gherghe, MD [facility] Pager [phone]. If 7 PM - 7 AM, please contact night-coverage at [email or web address], password [email or web address] 06/09/2014, 2:11 PM  LOS: 2 days

## 2014-06-09 NOTE — Progress Notes (Signed)
Notified Jimmye NormanKaren Kirby Graham NP of elevated blood pressure 255/72, patient remains asymptomatic, alert x 4. Denies pain, new orders Labetalol 5 mg I.V.  Noted and received.

## 2014-06-09 NOTE — Progress Notes (Signed)
Clinical Social Work Department BRIEF PSYCHOSOCIAL ASSESSMENT 06/09/2014  Patient:  TANGIA, PINARD     Account Number:  [id number]     Admit date:  06/07/2014  Clinical Social Worker:  Maryln Manuel  Date/Time:  06/09/2014 02:00 PM  Referred by:  Physician  Date Referred:  06/09/2014 Referred for  ALF Placement  SNF Placement   Other Referral:   Interview type:  Patient Other interview type:    PSYCHOSOCIAL DATA Living Status:  FACILITY Admitted from facility:  [facility] Level of care:  Assisted Living Primary support name:  Gwinda Passe Murrelle/daughter/ [phone] Primary support relationship to patient:  CHILD, ADULT Degree of support available:   adequate    CURRENT CONCERNS Current Concerns  Post-Acute Placement   Other Concerns:    SOCIAL WORK ASSESSMENT / PLAN CSW received referral that pt admitted from [facility].    CSW reviewed chart and met with patient and caregiver at bedside. CSW introduced myself and explained role. Patient reports she has been lives at [facility].  Patient reports she hires 24/7 care whenever she feels weak and currently has a Actuary in the room. CSW discussed with pt that PT plans to work with pt today and explored with pt if PT recommends higher level of care how pt would feel about going to rehab at [facility] for a short term before returning to assisted living. Patient reports she wants to return to ALF with 24/7 caregivers at DC. Patient is interested in therapy for PT/OT and reports that she wants to use the therapy at facility.    CSW spoke with Truman Hayward at ALF who confirms patient is at ALF and can return if stable. Per Truman Hayward at ALF, anticipation was that pt would not be willing to go to rehab section of facility, but ALF was hopeful that pt would consider it. ALF requests that CSW keep them updated in case skilled care is needed at DC.    CSW completed FL2 and sent [facility] clinicals.    CSW to continue to follow to provide support and assist with pt disposition needs.   Assessment/plan status:  Psychosocial Support/Ongoing Assessment of Needs Other assessment/ plan:   discharge planning   Information/referral to community resources:   Referral back to [facility].    PATIENT'S/FAMILY'S RESPONSE TO PLAN OF CARE: Patient alert and orientedx 4. Patient engaged in assessment and reports she feel best plan for disposition is to return to her ALF at [facility] with caregivers. Patient reports she is grateful that she lives at [facility] and feels they provide excellent care for her. Patient reports no family lives locally but she has a dtr in [place]. Patient and caregiver report that pt is normally independent, but requires private duty care during the times that she gets sick and weak.   Alison Murray, MSW, [place] [facility] [phone]

## 2014-06-09 NOTE — Progress Notes (Signed)
Notified Maren ReamerKaren Kirby- Cheree DittoGraham NP of elevated blood pressure, 255/68 pulse 72 after hydralazine 5 mg IV, new orders noted and received. Patient denies pain, asymptomatic  Alert, orient, x 4. Family sitter at bedside. .Marland Kitchen

## 2014-06-10 LAB — CBC
HEMATOCRIT: 36.9 % (ref 36.0–46.0)
HEMOGLOBIN: 12.3 g/dL (ref 12.0–15.0)
MCH: 32.2 pg (ref 26.0–34.0)
MCHC: 33.3 g/dL (ref 30.0–36.0)
MCV: 96.6 fL (ref 78.0–100.0)
PLATELETS: 268 10*3/uL (ref 150–400)
RBC: 3.82 MIL/uL — AB (ref 3.87–5.11)
RDW: 14.9 % (ref 11.5–15.5)
WBC: 11.6 10*3/uL — ABNORMAL HIGH (ref 4.0–10.5)

## 2014-06-10 LAB — BASIC METABOLIC PANEL
Anion gap: 8 (ref 5–15)
BUN: 21 mg/dL (ref 6–23)
CALCIUM: 8.8 mg/dL (ref 8.4–10.5)
CO2: 27 mmol/L (ref 19–32)
Chloride: 102 mmol/L (ref 96–112)
Creatinine, Ser: 0.99 mg/dL (ref 0.50–1.10)
GFR calc Af Amer: 55 mL/min — ABNORMAL LOW (ref >90)
GFR, EST NON AFRICAN AMERICAN: 48 mL/min — AB (ref >90)
GLUCOSE: 118 mg/dL — AB (ref 70–99)
POTASSIUM: 4.3 mmol/L (ref 3.5–5.1)
SODIUM: 137 mmol/L (ref 135–145)

## 2014-06-10 MED ORDER — PROMETHAZINE HCL 25 MG/ML IJ SOLN
6.2500 mg | Freq: Four times a day (QID) | INTRAMUSCULAR | Status: DC | PRN
  Administered 2014-06-10: 6.25 mg via INTRAVENOUS
  Filled 2014-06-10: qty 1

## 2014-06-10 MED ORDER — GLYCERIN (LAXATIVE) 2.1 G RE SUPP
1.0000 | Freq: Once | RECTAL | Status: AC
  Administered 2014-06-10: 1 via RECTAL
  Filled 2014-06-10: qty 1

## 2014-06-10 MED ORDER — FENTANYL 25 MCG/HR TD PT72: 25.0000 ug | MEDICATED_PATCH | TRANSDERMAL | Status: DC

## 2014-06-10 MED ORDER — LORAZEPAM 0.5 MG PO TABS: 0.5000 mg | ORAL_TABLET | Freq: Every morning | ORAL | Status: DC

## 2014-06-10 MED ORDER — HYDROCODONE-ACETAMINOPHEN 7.5-325 MG PO TABS: 1.0000 | ORAL_TABLET | Freq: Four times a day (QID) | ORAL | Status: DC | PRN

## 2014-06-10 MED ORDER — TRAMADOL HCL 50 MG PO TABS: 50.0000 mg | ORAL_TABLET | Freq: Every day | ORAL | Status: DC

## 2014-06-10 NOTE — Progress Notes (Addendum)
Physical Therapy Treatment Patient Details Name: [name] MRN: [id number] DOB: November 03, 1918 Today's Date: 06/10/2014    History of Present Illness 81 yo female with htn, hyperlipidemia admitted 2/23 with c/o abdominal pain, +constipation. Pt had CT scan that was unrevealing as to the cause of her abdominal pain, Pt had noted 3.1cm aneurysm. BP has  been very high with difficulty controlling.. recently diagnosed with multiple compression fractures  -T7 ,T11, L2., neurosurgery recommended a back brace    PT Comments    Pt continues to require +2 assist and Max encouragement for participation. Pt not very willing to put forth max effort to perform ambulation. Caregiver present to observe. Caregiver was asked if she felt they were able to manage pt at home and caregiver stated that they could. Pt could possibly d/c home as long as family/caregivers are able to provide current level of assist. If not, then need to consider SNF. Pt c/o pain at start of session. When asked at end of session,pt denied pain. Paged/made MD aware of assist needed.  Follow Up Recommendations  Home health PT;Supervision/Assistance - 24 hour;SNF (depending on ability of caregivers/level of care they are able to provide)     Equipment Recommendations  None recommended by PT    Recommendations for Other Services       Precautions / Restrictions Precautions Precautions: Fall Precaution Comments: monitor BP, incontinence Restrictions Weight Bearing Restrictions: No    Mobility  Bed Mobility Overal bed mobility: Needs Assistance Bed Mobility: Supine to Sit;Sit to Supine     Supine to sit: +2 for physical assistance;HOB elevated;Max assist Sit to supine: +2 for physical assistance;HOB elevated;Max assist   General bed mobility comments: Increased assistance and cueing needed this session. Pt did not initiate bed mobility task. Utilized bedpad for scooting, positioning.   Transfers Overall transfer  level: Needs assistance Equipment used: Rolling walker (2 wheeled) Transfers: Sit to/from Stand Sit to Stand: Max assist;+2 physical assistance;+2 safety/equipment       General transfer comment: stood from bed multiple times with multimodal cues. Pt stood for ~10 seconds each time before abruptly sitting back down. Increased assist required to keep pt in standing to attempt ambulation.   Ambulation/Gait Ambulation/Gait assistance: Max assist;+2 safety/equipment Ambulation Distance (Feet): 30 Feet Assistive device: Rolling walker (2 wheeled) Gait Pattern/deviations: Shuffle;Scissoring     General Gait Details: +2 to support pt to maintain standing and to maneuver with walker to attempt/encourage ambulation. Pt resistant and attempting to sit down repeatedly. Max encouragement/assist required to complete task.    Stairs            Wheelchair Mobility    Modified Rankin (Stroke Patients Only)       Balance           Standing balance support: Bilateral upper extremity supported;During functional activity Standing balance-Leahy Scale: Zero                      Cognition Arousal/Alertness: Awake/alert Behavior During Therapy: Anxious Overall Cognitive Status: Difficult to assess                      Exercises      General Comments        Pertinent Vitals/Pain Faces Pain Scale: Hurts even more Pain Location: "all over" Pain Descriptors / Indicators: Sore Pain Intervention(s): Monitored during session;Repositioned    Home Living  Prior Function            PT Goals (current goals can now be found in the care plan section) Progress towards PT goals: Progressing toward goals (very slowly)    Frequency  Min 3X/week    PT Plan Current plan remains appropriate    Co-evaluation             End of Session Equipment Utilized During Treatment: Gait belt Activity Tolerance: Patient limited by pain;Patient  limited by fatigue (Limited by decreased willingness to participate) Patient left: in bed;with call bell/phone within reach;with nursing/sitter in room     Time: 1422-1439 PT Time Calculation (min) (ACUTE ONLY): 17 min  Charges:  $Gait Training: 8-22 mins                    G Codes:      Rebeca AlertJannie Syris Brookens, MPT Pager: [phone]

## 2014-06-10 NOTE — Progress Notes (Signed)
Clinical Social Work  CSW spoke with patient and dtr  [name](Betsy) re: DC plans. Patient understanding that she is weak and although she would prefer to return to ALF she is agreeable to [facility] SNF. CSW faxed DC summary to [facility] who is agreeable to accept to SNF today. Patient and family prefer [facility] and are aware of no guarantee of payment. CSW prepared DC packet with FL2, DC summary, DNR and hard scripts included. RN to call report. Patient hopeful to only stay at SNF for a short period because she enjoys her apartment. Dtr was glad that patient was agreeable to SNF because she feels it will be safer for patient.  [facility] request #:[id number]  CSW is signing off but available if needed.  DakotaHolly Tedra [place], [place] [phone]

## 2014-06-10 NOTE — Progress Notes (Signed)
[facility]

## 2014-06-10 NOTE — Progress Notes (Signed)
Clinical Social Work Department CLINICAL SOCIAL WORK PLACEMENT NOTE 06/10/2014  Patient:  [name],[name]  Account Number:  [id number] Admit date:  06/07/2014  Clinical Social Worker:  Unk LightningHOLLY Tyianna Menefee, LCSW  Date/time:  06/10/2014 04:00 PM  Clinical Social Work is seeking post-discharge placement for this patient at the following level of care:   SKILLED NURSING   (*CSW will update this form in Epic as items are completed)   06/10/2014  Patient/family provided with [facility] of Clinical Social Work's list of facilities offering this level of care within the geographic area requested by the patient (or if unable, by the patient's family).  06/10/2014  Patient/family informed of their freedom to choose among providers that offer the needed level of care, that participate in Medicare, Medicaid or managed care program needed by the patient, have an available bed and are willing to accept the patient.  06/10/2014  Patient/family informed of [facility]' ownership interest in [facility], as well as of the fact that they are under no obligation to receive care at this facility.  PASARR submitted to EDS on existing # PASARR number received on   FL2 transmitted to all facilities in geographic area requested by pt/family on  06/10/2014 FL2 transmitted to all facilities within larger geographic area on   Patient informed that his/her managed care company has contracts with or will negotiate with  certain facilities, including the following:     Patient/family informed of bed offers received:  06/10/2014 [facility] Physician recommends and patient chooses bed at    [facility]  06/10/2014 Patient to be transferred to facility by [facility] Patient and family notified of transfer on 06/10/2014 Name of family member notified:  Dtr-Betsy  The following physician request were entered in  Epic:   Additional Comments:

## 2014-06-10 NOTE — Discharge Summary (Addendum)
Physician Discharge Summary  Francesco RunnerMargaret Freimuth ZOX:[id number] DOB: Sep 12, 1918 DOA: 06/07/2014  PCP: Kimber RelicGREEN, ARTHUR G, MD  Admit date: 06/07/2014 Discharge date: 06/10/2014  Time spent: 35 minutes  Recommendations for Outpatient Follow-up:  1. Follow up with Dr. Chilton SiGreen in 1 week 2. Home health PT / supervsion   Discharge Diagnoses:  Active Problems:   Abdominal pain   Hyponatremia   Renal insufficiency   Protein-calorie malnutrition, severe  Discharge Condition: stable  Diet recommendation: regular  Filed Weights   06/07/14 2100  Weight: 44.2 kg (97 lb 7.1 oz)   History of present illness:  81 yo female with htn, hyperlipidemia c/o abdominal pain, sharp, ruq, and diffuse, starting this am. Pain was not improving and therefore sent to ED for evaluation. +constipation. Pt denies fever, chills, n/v, diarrhea, brbpr, black stool. Pt had CT scan that was unrevealing as to the cause of her abdominal pain, Pt had noted 3.1cm aneurysm. No dissection seen. Pt states pain is intermittent. Pt will be admitted for w/up of abdominal pain.   Hospital Course:  Back/Abdominal pain - Patient was admitted to the hospital with back and abdominal pain. She underwent a CT scan of the abdomen which was unrevealing as to regards to her abdominal pain. She also underwent a RUQ US without evidence of gallbladder or liver disease. Patient's pain described to me was back predominantly irradiating into her right flank, similar to her pain during her previous admission. At that time, she underwent an MRI on 06/01/2014 which showed " Numerous compression fractures throughout the thoracic spine which appear benign. Mild compression fracture T7 which appears acute or subacute. Mild compression fracture T9 which appears acute or subacute Compression fractures at T11 and L2 contain a mild amount of edema and may be late subacute fractures. No cord compression. "" Dr. Robb Matarrtiz at that time discussed these findings with  Dr. Lovell SheehanJenkins from neurosurgery and recommended conservative management with pain control in a brace.  Constipation - likely contributing to her pain, abdominal discomfort improved after BMs Dehydration - please encourage adequate po intake as this can contribute to #1 and #2 as well as renal failure HTN - brittle, can vary but overall good control on current regimen.  Hyponatremia - in the setting of dehydration, improved with normal saline Acute renal failure on chronic kidney disease stage III - creatinine improved with hydration History of tacchyarrhythmias - continue digoxin   Procedures:  None    Consultations:  None   Discharge Exam: Filed Vitals:   06/10/14 0650 06/10/14 1000 06/10/14 1405 06/10/14 1420  BP: 170/90 164/72 114/75 122/66  Pulse:   82 87  Temp:    98 F (36.7 C)  TempSrc:    Oral  Resp:    22  Height:      Weight:      SpO2:    92%    General: NAD Cardiovascular: RRR Respiratory: CTA biL  Discharge Instructions     Medication List    TAKE these medications        acetaminophen 325 MG tablet  Commonly known as:  TYLENOL  Take 650 mg by mouth every 4 (four) hours as needed for mild pain or fever.     brimonidine 0.2 % ophthalmic solution  Commonly known as:  ALPHAGAN  Place 1 drop into both eyes 2 (two) times daily.     cholecalciferol 1000 UNITS tablet  Commonly known as:  VITAMIN D  Take 2,000 Units by mouth daily.  cloNIDine 0.1 MG tablet  Commonly known as:  CATAPRES  Take 0.1 mg by mouth daily.     cyanocobalamin 500 MCG tablet  Take 500 mcg by mouth daily.     demeclocycline 150 MG tablet  Commonly known as:  DECLOMYCIN  Take 150 mg by mouth 2 (two) times daily.     digoxin 0.125 MG tablet  Commonly known as:  LANOXIN  Take 0.0625 mg by mouth daily.     fentaNYL 25 MCG/HR patch  Commonly known as:  DURAGESIC - dosed mcg/hr  Place 25 mcg onto the skin every 3 (three) days.     hydrALAZINE 50 MG tablet  Commonly known  as:  APRESOLINE  Take 50 mg by mouth 2 (two) times daily.     HYDROcodone-acetaminophen 7.5-325 MG per tablet  Commonly known as:  NORCO  Take 1 tablet by mouth every 6 (six) hours as needed for moderate pain.     LORazepam 0.5 MG tablet  Commonly known as:  ATIVAN  Take 0.5 mg by mouth every morning.     LORazepam 0.5 MG tablet  Commonly known as:  ATIVAN  Take 0.5 mg by mouth 2 (two) times daily as needed for anxiety.     magnesium hydroxide 400 MG/5ML suspension  Commonly known as:  MILK OF MAGNESIA  Take 30 mLs by mouth daily as needed for mild constipation.     metoprolol tartrate 25 MG tablet  Commonly known as:  LOPRESSOR  Take 75 mg by mouth 2 (two) times daily.     mirtazapine 7.5 MG tablet  Commonly known as:  REMERON  Take 7.5 mg by mouth at bedtime.     omeprazole 20 MG capsule  Commonly known as:  PRILOSEC  Take 1 capsule by mouth every morning.     ondansetron 4 MG tablet  Commonly known as:  ZOFRAN  Take 4 mg by mouth every 8 (eight) hours as needed for nausea or vomiting.     polyethylene glycol packet  Commonly known as:  MIRALAX / GLYCOLAX  Take 17 g by mouth daily.     polyethylene glycol packet  Commonly known as:  MIRALAX / GLYCOLAX  Take 17 g by mouth daily as needed.     potassium chloride 10 MEQ tablet  Commonly known as:  K-DUR,KLOR-CON  Take 10 mEq by mouth daily.     traMADol 50 MG tablet  Commonly known as:  ULTRAM  Take 50 mg by mouth daily.           Follow-up Information    Follow up with GREEN, [name], MD. Schedule an appointment as soon as possible for a visit in 1 week.   Specialty:  Internal Medicine   Contact information:   [address] [place] [address] [phone]       The results of significant diagnostics from this hospitalization (including imaging, microbiology, ancillary and laboratory) are listed below for reference.    Significant Diagnostic Studies: Ct Abdomen Pelvis Wo Contrast  06/07/2014    CLINICAL DATA:  Nursing home patient with acute abdominal pain in the right upper quadrant.  EXAM: CT ABDOMEN AND PELVIS WITHOUT CONTRAST  TECHNIQUE: Multidetector CT imaging of the abdomen and pelvis was performed following the standard protocol without IV contrast.  COMPARISON:  None available  FINDINGS: Lower chest: Trace pleural effusions dependently. There is associated in minimal bibasilar atelectasis. Calcified left lower lobe granuloma noted, image 6. No pneumothorax. Atherosclerosis noted of the aorta  and coronary vessels. Aneurysmal dilatation of the left ventricle noted. No hiatal hernia.  Abdomen: Limited exam with respiratory motion artifact and lack of IV and oral contrast.  2 cm hypodense probable cyst in the left hepatic lobe lateral segment, image 19. No biliary dilatation. Liver, gallbladder, biliary system, pancreas, spleen, and adrenal glands are within normal limits for age and noncontrast imaging.  The kidneys demonstrate an extrarenal pelvis on the right. No acute obstructive uropathy or hydronephrosis. No obstructing urinary tract 4 ureteral calculus appreciated. Right kidney demonstrates scattered small subcentimeter hyperdense lesions, suspect numerous hyperdense cysts.  Aorta is atherosclerotic and tortuous. Mild aneurysmal dilatation of the suprarenal aorta measuring 3.1 cm, image 20. No retroperitoneal hemorrhage.  Negative for bowel obstruction, dilatation, ileus, or free air.  No abdominal free fluid, fluid collection, hemorrhage, abscess, adenopathy.  Pelvis: Extensive iliac atherosclerosis. Diverticulosis noted of the sigmoid colon. No acute inflammatory process. Prior hysterectomy noted. No pelvic free fluid, fluid collection, hemorrhage, abscess, or adenopathy. Right inguinal hernia noted containing loops of small bowel without associated obstruction. Pelvic calcifications consistent with venous phleboliths.  Diffuse osteopenia noted. Degenerative changes noted of the hips,  pelvis and spine diffusely. multiple thoracic and lumbar compression fractures.  IMPRESSION: No definite acute intra-abdominal or pelvic finding by noncontrast imaging. Limited exam with motion artifact.  Trace pleural effusions and bibasilar atelectasis  Tortuous atherosclerotic aneurysmal abdominal aorta, maximal diameter 3.1 cm.  No acute obstructive uropathy or hydronephrosis. No obstructing urinary tract calculus.  Sigmoid diverticulosis  Left inguinal hernia containing small bowel but no evidence of incarceration or obstruction.   Electronically Signed   By: Judie Petit.  Shick M.D.   On: 06/07/2014 18:22   US Abdomen Limited Ruq  06/07/2014   CLINICAL DATA:  Right upper quadrant pain for 1 day.  EXAM: US ABDOMEN LIMITED - RIGHT UPPER QUADRANT  COMPARISON:  CT earlier the same day.  FINDINGS: Gallbladder:  No gallstones or wall thickening visualized. No sonographic Murphy sign noted.  Common bile duct:  Diameter: 5 mm.  Liver:  Within the left hepatic lobe is a 1.1 x 1.3 x 1.1 cm cyst. Question punctate hepatic granuloma, image number 12 and 16. Within normal limits in parenchymal echogenicity.  Technologist notes patient had tenderness in the right upper quadrant pole scanning, however no frank sonographic Murphy sign.  IMPRESSION: 1. Normal sonographic appearance of the gallbladder. No biliary dilatation. 2. Cyst in the left hepatic lobe.   Electronically Signed   By: Rubye Oaks M.D.   On: 06/07/2014 21:05    Microbiology: Recent Results (from the past 240 hour(s))  MRSA PCR Screening     Status: None   Collection Time: 06/07/14  9:09 PM  Result Value Ref Range Status   MRSA by PCR NEGATIVE NEGATIVE Final    Comment:        The GeneXpert MRSA Assay (FDA approved for NASAL specimens only), is one component of a comprehensive MRSA colonization surveillance program. It is not intended to diagnose MRSA infection nor to guide or monitor treatment for MRSA infections.     Labs: Basic Metabolic  Panel:  Recent Labs Lab 06/07/14 1651 06/07/14 1717 06/08/14 0400 06/09/14 0354 06/10/14 0645  NA 131* 128* 135 134* 137  K 4.7 4.7 4.0 3.4* 4.3  CL 94* 95* 100 100 102  CO2 25  --  25 25 27   GLUCOSE 113* 116* 89 93 118*  BUN 42* 44* 29* 21 21  CREATININE 1.54* 1.80* 1.00 0.91 0.99  CALCIUM  9.2  --  8.6 8.3* 8.8   Liver Function Tests:  Recent Labs Lab 06/07/14 1651 06/08/14 0400  AST 24 21  ALT 15 12  ALKPHOS 134* 115  BILITOT 0.7 0.6  PROT 6.9 6.2  ALBUMIN 3.6 3.2*    Recent Labs Lab 06/07/14 1651  LIPASE 19   CBC:  Recent Labs Lab 06/07/14 1651 06/07/14 1717 06/08/14 0400 06/08/14 0927 06/09/14 0354 06/10/14 0645  WBC 10.9*  --  8.8 8.0 7.8 11.6*  NEUTROABS 8.2*  --   --  5.4  --   --   HGB 12.7 14.3 11.4* 10.9* 11.7* 12.3  HCT 38.1 42.0 34.4* 33.4* 35.8* 36.9  MCV 95.3  --  95.8 96.0 96.2 96.6  PLT 240  --  217 215 216 268    Signed:  GHERGHE, COSTIN  [facility] 06/10/2014, 2:27 PM

## 2014-06-13 ENCOUNTER — Encounter: Payer: Self-pay | Admitting: Nurse Practitioner

## 2014-06-13 ENCOUNTER — Non-Acute Institutional Stay (SKILLED_NURSING_FACILITY): Payer: Medicare Other | Admitting: Nurse Practitioner

## 2014-06-13 ENCOUNTER — Other Ambulatory Visit: Payer: Self-pay | Admitting: Nurse Practitioner

## 2014-06-13 DIAGNOSIS — I160 Hypertensive urgency: Secondary | ICD-10-CM

## 2014-06-13 DIAGNOSIS — E46 Unspecified protein-calorie malnutrition: Secondary | ICD-10-CM

## 2014-06-13 DIAGNOSIS — K219 Gastro-esophageal reflux disease without esophagitis: Secondary | ICD-10-CM

## 2014-06-13 DIAGNOSIS — F329 Major depressive disorder, single episode, unspecified: Secondary | ICD-10-CM

## 2014-06-13 DIAGNOSIS — E039 Hypothyroidism, unspecified: Secondary | ICD-10-CM

## 2014-06-13 DIAGNOSIS — S32020D Wedge compression fracture of second lumbar vertebra, subsequent encounter for fracture with routine healing: Secondary | ICD-10-CM | POA: Diagnosis not present

## 2014-06-13 DIAGNOSIS — I10 Essential (primary) hypertension: Secondary | ICD-10-CM

## 2014-06-13 DIAGNOSIS — D649 Anemia, unspecified: Secondary | ICD-10-CM | POA: Diagnosis not present

## 2014-06-13 DIAGNOSIS — E871 Hypo-osmolality and hyponatremia: Secondary | ICD-10-CM | POA: Diagnosis not present

## 2014-06-13 DIAGNOSIS — I480 Paroxysmal atrial fibrillation: Secondary | ICD-10-CM | POA: Diagnosis not present

## 2014-06-13 DIAGNOSIS — K5900 Constipation, unspecified: Secondary | ICD-10-CM | POA: Diagnosis not present

## 2014-06-13 DIAGNOSIS — F32A Depression, unspecified: Secondary | ICD-10-CM

## 2014-06-13 LAB — BASIC METABOLIC PANEL
BUN: 36 mg/dL — AB (ref 4–21)
CREATININE: 1.3 mg/dL — AB (ref 0.5–1.1)
Glucose: 93 mg/dL
Potassium: 4.3 mmol/L (ref 3.4–5.3)
Sodium: 140 mmol/L (ref 137–147)

## 2014-06-13 LAB — HEPATIC FUNCTION PANEL
ALT: 10 U/L (ref 7–35)
AST: 17 U/L (ref 13–35)
Alkaline Phosphatase: 138 U/L — AB (ref 25–125)
BILIRUBIN, TOTAL: 0.5 mg/dL

## 2014-06-13 NOTE — Assessment & Plan Note (Signed)
12/28/13 Hgb 10.8, MCV 95.4, MCH 31.2 06/07/14 Hgb 12.9 06/13/14 continue B12

## 2014-06-13 NOTE — Assessment & Plan Note (Signed)
-   elevated blood pressure in ED-There is most likely due to pain. - Continue current home regimen after pain was controlled her blood pressure improved. -Bp 94/52 today-will hold Clonidine if Bp,100/60-continue to monitor Bps.

## 2014-06-13 NOTE — Assessment & Plan Note (Signed)
Stable: sleeps well at night. Weights stabilized. Continue Remeron 7.5mg  nightly. Continue Lorazepam daily and bid prn.

## 2014-06-13 NOTE — Assessment & Plan Note (Addendum)
No further C/o nausea. Continue Omeprazole. Zofran prn available to her.    

## 2014-06-13 NOTE — Assessment & Plan Note (Signed)
Heart rate is in control. Continue Digoxin Dig level prior to next appointment 06/2014

## 2014-06-13 NOTE — Progress Notes (Signed)
Patient ID: [name], female   DOB: [birth year], 81 y.o.   MRN: [id number]    Code Status: DNR  Allergies  Allergen Reactions  . Codeine Other (See Comments)    Unknown reaction. Info from a MAR.    Chief Complaint  Patient presents with  . Medical Management of Chronic Issues  . Acute Visit  . Hospitalization Follow-up    HPI: Patient is a 81 y.o. female seen in the SNF at [facility] today for evaluation of s/p hospitalization for abd pain/L2 lumbar vertebral fx  and other chronic medical conditions.    Hospitalized 05/31/2014-06/02/2014 for Compression fracture of L2 lumbar vertebra with routine healing, Hypothyroidism, Hypertensive urgency, CKD (chronic kidney disease) stage 3, GFR 30-59 ml/min, Pancreatitis   CT abdomen and pelvis done shows multiple thoracic compression fractures and pancreas shows possible pancreatitis-moist likely the cause of abd pain for this hospital stay.    Problem List Items Addressed This Visit    Hypothyroidism    TSH 2.010 12/28/13. Not taking supplement. Update TSH prior to next appointment 06/2014        Hyponatremia (Chronic)    10/12/13 Na 135 11/23/13 Na 137 06/07/14 Na 128 Taking Demeclocycline  bid.  06/13/14 update BMP      Hypertensive urgency    - elevated blood pressure in ED-There is most likely due to pain. - Continue current home regimen after pain was controlled her blood pressure improved. -Bp 94/52 today-will hold Clonidine if Bp,100/60-continue to monitor Bps.       GERD (gastroesophageal reflux disease)    No further C/o nausea. Continue Omeprazole. Zofran prn available to her.         Depression    Stable: sleeps well at night. Weights stabilized. Continue Remeron 7.5mg  nightly. Continue Lorazepam daily and bid prn.      Constipation - Primary    continue her MiraLAX and make sure she is having bowel movements. Prn MiraLax available too.         Compression fracture of L2 lumbar  vertebra with routine healing     Most likely due to thoracic compression fractures, she does have mild compression fracture of T11 and L2. - Discussed the case with neurosurgery Dr. Lovell Sheehan and he recommended narcotics and a brace - Physical therapy evaluated the patient and recommended home PT - Her pain was controlled with Vicodin and fentanyl.       Anemia    12/28/13 Hgb 10.8, MCV 95.4, MCH 31.2 06/07/14 Hgb 12.9 06/13/14 continue B12      A-fib    Heart rate is in control. Continue Digoxin Dig level prior to next appointment 06/2014         Review of Systems:  Review of Systems  Constitutional: Positive for weight loss and malaise/fatigue. Negative for fever, chills and diaphoresis.  HENT: Positive for hearing loss. Negative for congestion, ear discharge, ear pain, nosebleeds, sore throat and tinnitus.   Eyes: Negative for blurred vision, double vision, photophobia, pain, discharge and redness.  Respiratory: Negative for cough, hemoptysis, sputum production, shortness of breath, wheezing and stridor.   Cardiovascular: Negative for chest pain, palpitations, orthopnea, claudication, leg swelling and PND.  Gastrointestinal: Positive for constipation. Negative for heartburn, nausea, vomiting, abdominal pain, diarrhea, blood in stool and melena.  Genitourinary: Positive for frequency. Negative for dysuria, urgency, hematuria and flank pain.  Musculoskeletal: Positive for back pain. Negative for myalgias, joint pain, falls and neck pain.  Skin: Negative for itching and rash.  Neurological: Positive for weakness. Negative for dizziness, tingling, tremors, sensory change, speech change, focal weakness, seizures, loss of consciousness and headaches.  Endo/Heme/Allergies: Negative for environmental allergies and polydipsia. Does not bruise/bleed easily.  Psychiatric/Behavioral: Positive for depression and memory loss. Negative for suicidal ideas, hallucinations and substance abuse. The  patient is not nervous/anxious and does not have insomnia.      Past Medical History  Diagnosis Date  . Hypertension   . Hypothyroidism   . Atrial fibrillation   . Hyponatremia     hx of  . Syncope     hx syncope and collapse per paper from [facility]  . Unconscious 04/06/12    per [facility] paper -hx unconscious episodes -pt asked not to be sent to hospital for these  . Fx lumbar vertebra-closed     hx of per friends home notes  . Lower back pain     hx of   Social History:   reports that she has never smoked. She has never used smokeless tobacco. She reports that she does not drink alcohol or use illicit drugs.   Medications: Patient's Medications  New Prescriptions   No medications on file  Previous Medications   ACETAMINOPHEN (TYLENOL) 325 MG TABLET    Take 650 mg by mouth every 4 (four) hours as needed for mild pain.   BRIMONIDINE (ALPHAGAN) 0.2 % OPHTHALMIC SOLUTION    One drop both eyes twice daily   CHOLECALCIFEROL (VITAMIN D) 2000 UNITS TABLET    Take 2,000 Units by mouth every morning.   CLONIDINE (CATAPRES) 0.1 MG TABLET    Take 1 tablet (0.1 mg total) by mouth daily.   DEMECLOCYCLINE (DECLOMYCIN) 150 MG TABLET    Take 150 mg by mouth 2 (two) times daily.   DIGOXIN (LANOXIN) 0.125 MG TABLET    Take 0.125 mg by mouth daily. Take 1/2 tablet every morning * check pulse daily*   FENTANYL (DURAGESIC) 25 MCG/HR PATCH    Place 1 patch (25 mcg total) onto the skin every 3 (three) days.   HYDRALAZINE (APRESOLINE) 50 MG TABLET    Take 50 mg by mouth 2 (two) times daily.   HYDROCODONE-ACETAMINOPHEN (NORCO) 7.5-325 MG PER TABLET    Take 1 tablet by mouth every 6 (six) hours as needed for moderate pain.   KLOR-CON M10 10 MEQ TABLET    Take one tablet daily   LORAZEPAM (ATIVAN) 0.5 MG TABLET    Take 1 tablet (0.5 mg total) by mouth every 8 (eight) hours as needed for anxiety. Take one tablet every morning; take one twice daily as needed for anxiety   MAGNESIUM  HYDROXIDE (MILK OF MAGNESIA) 400 MG/5ML SUSPENSION    Take 30 mLs by mouth daily as needed. For constipation   METOPROLOL TARTRATE (LOPRESSOR) 25 MG TABLET    Take 3 tablets (75 mg total) by mouth 2 (two) times daily.   MIRTAZAPINE (REMERON SOL-TAB) 15 MG DISINTEGRATING TABLET    Take 7.5 mg by mouth at bedtime.   OMEPRAZOLE (PRILOSEC) 20 MG CAPSULE    Take 20 mg by mouth every morning. * Do Not Crush* Take on a empty stomach.   ONDANSETRON (ZOFRAN) 4 MG TABLET    Take 4 mg by mouth 3 (three) times daily as needed for nausea or vomiting.   POLYETHYLENE GLYCOL (MIRALAX / GLYCOLAX) PACKET    Take 17 g by mouth daily as needed for moderate constipation.   POLYETHYLENE GLYCOL (MIRALAX) PACKET    Take 17 g  by mouth daily.   TRAMADOL (ULTRAM) 50 MG TABLET    Take 1 tablet (50 mg total) by mouth daily.   VITAMIN B-12 (CYANOCOBALAMIN) 500 MCG TABLET    Take 500 mcg by mouth every morning.  Modified Medications   No medications on file  Discontinued Medications   No medications on file     Physical Exam: Physical Exam  Constitutional: She is oriented to person, place, and time. She appears well-developed and well-nourished. No distress.  HENT:  Head: Normocephalic and atraumatic.  Mouth/Throat: No oropharyngeal exudate.  Eyes: Conjunctivae and EOM are normal. Pupils are equal, round, and reactive to light. Right eye exhibits no discharge. Left eye exhibits no discharge.  Neck: Normal range of motion. Neck supple. No JVD present. No thyromegaly present.  Cardiovascular: Normal rate, regular rhythm and normal heart sounds.   No murmur heard. Pulmonary/Chest: Effort normal and breath sounds normal. No respiratory distress. She has no wheezes. She has no rales.  Abdominal: Soft. Bowel sounds are normal. She exhibits no distension. There is no tenderness.  Musculoskeletal: She exhibits tenderness (lower back). She exhibits no edema.  Back pain is managed with Fentanyl patch and lumbar brace    Lymphadenopathy:    She has no cervical adenopathy.  Neurological: She is alert and oriented to person, place, and time. She has normal reflexes. No cranial nerve deficit. She exhibits normal muscle tone. Coordination normal.  Skin: Skin is warm and dry. No rash noted. She is not diaphoretic. No erythema.  Psychiatric: Her mood appears not anxious. Her affect is not angry, not blunt, not labile and not inappropriate. Her speech is not rapid and/or pressured, not delayed, not tangential and not slurred. She is slowed. She is not agitated, not aggressive, not hyperactive, not withdrawn, not actively hallucinating and not combative. Thought content is not paranoid and not delusional. Cognition and memory are impaired. She does not exhibit a depressed mood. She is communicative. She exhibits abnormal recent memory.    Filed Vitals:   06/13/14 1041  BP: 94/52  Pulse: 70  Temp: 98.4 F (36.9 C)  TempSrc: Tympanic  Resp: 16      Labs reviewed: Basic Metabolic Panel:  Recent Labs  16/10/96  05/31/14 1826 05/31/14 2359 06/01/14 0510 06/07/14  NA 140  --  137  --  143 128*  K 4.5  --  3.7  --  4.0 5.1  CL  --   --  102  --  101  --   CO2  --   --  27  --  30  --   GLUCOSE  --   --  122*  --  101*  --   BUN 27*  --  27*  --  31* 40*  CREATININE 1.3*  < > 1.14* 1.17* 1.35* 1.6*  CALCIUM  --   --  8.8  --  9.1  --   TSH 2.01  --   --  1.457  --   --   < > = values in this interval not displayed. Liver Function Tests:  Recent Labs  05/31/14 1826 06/01/14 0510 06/07/14  AST 19 18 25   ALT 10 9 12   ALKPHOS 86 78 134*  BILITOT 0.9 0.9  --   PROT 6.9 6.2  --   ALBUMIN 3.5 3.2*  --     Recent Labs  05/31/14 2052 06/01/14 0510  LIPASE 17 17   CBC:  Recent Labs  05/31/14 1826 05/31/14 2359 06/01/14 0510  06/07/14  WBC 9.1 8.1 7.5 11.7  NEUTROABS 6.5  --  5.0  --   HGB 11.3* 10.7* 10.9* 12.9  HCT 34.5* 32.0* 33.1* 39  MCV 96.6 96.7 97.6  --   PLT 230 227 241 249    Lipase 12/15/12 28  Amylase 12/15/12 71  Assessment/Plan Constipation continue her MiraLAX and make sure she is having bowel movements. Prn MiraLax available too.      Compression fracture of L2 lumbar vertebra with routine healing  Most likely due to thoracic compression fractures, she does have mild compression fracture of T11 and L2. - Discussed the case with neurosurgery Dr. Lovell SheehanJenkins and he recommended narcotics and a brace - Physical therapy evaluated the patient and recommended home PT - Her pain was controlled with Vicodin and fentanyl.    A-fib Heart rate is in control. Continue Digoxin Dig level prior to next appointment 06/2014   Depression Stable: sleeps well at night. Weights stabilized. Continue Remeron 7.5mg  nightly. Continue Lorazepam daily and bid prn.   Hypertensive urgency - elevated blood pressure in ED-There is most likely due to pain. - Continue current home regimen after pain was controlled her blood pressure improved. -Bp 94/52 today-will hold Clonidine if Bp,100/60-continue to monitor Bps.    Anemia 12/28/13 Hgb 10.8, MCV 95.4, MCH 31.2 06/07/14 Hgb 12.9 06/13/14 continue B12   Hyponatremia 10/12/13 Na 135 11/23/13 Na 137 06/07/14 Na 128 Taking Demeclocycline 150mg  bid.  06/13/14 update BMP   GERD (gastroesophageal reflux disease) No further C/o nausea. Continue Omeprazole. Zofran prn available to her.      Hypothyroidism TSH 2.010 12/28/13. Not taking supplement. Update TSH prior to next appointment 06/2014       Family/ Staff Communication: observe the patient.   Goals of Care: SNF  Labs/tests ordered:  BMP

## 2014-06-13 NOTE — Assessment & Plan Note (Signed)
Most likely due to thoracic compression fractures, she does have mild compression fracture of T11 and L2. - Discussed the case with neurosurgery Dr. Lovell SheehanJenkins and he recommended narcotics and a brace - Physical therapy evaluated the patient and recommended home PT - Her pain was controlled with Vicodin and fentanyl.

## 2014-06-13 NOTE — Assessment & Plan Note (Addendum)
continue her MiraLAX and make sure she is having bowel movements. Prn MiraLax available too.    

## 2014-06-13 NOTE — Assessment & Plan Note (Signed)
10/12/13 Na 135 11/23/13 Na 137 06/07/14 Na 128 Taking Demeclocycline 150mg  bid.  06/13/14 update BMP

## 2014-06-13 NOTE — Assessment & Plan Note (Signed)
TSH 2.010 12/28/13. Not taking supplement. Update TSH prior to next appointment 06/2014

## 2014-06-14 ENCOUNTER — Encounter: Payer: Self-pay | Admitting: Nurse Practitioner

## 2014-06-15 ENCOUNTER — Encounter: Payer: Self-pay | Admitting: Nurse Practitioner

## 2014-06-15 ENCOUNTER — Non-Acute Institutional Stay (SKILLED_NURSING_FACILITY): Payer: Medicare Other | Admitting: Nurse Practitioner

## 2014-06-15 DIAGNOSIS — E876 Hypokalemia: Secondary | ICD-10-CM | POA: Diagnosis not present

## 2014-06-15 DIAGNOSIS — E871 Hypo-osmolality and hyponatremia: Secondary | ICD-10-CM | POA: Diagnosis not present

## 2014-06-15 DIAGNOSIS — I10 Essential (primary) hypertension: Secondary | ICD-10-CM | POA: Diagnosis not present

## 2014-06-15 DIAGNOSIS — F329 Major depressive disorder, single episode, unspecified: Secondary | ICD-10-CM

## 2014-06-15 DIAGNOSIS — N289 Disorder of kidney and ureter, unspecified: Secondary | ICD-10-CM

## 2014-06-15 DIAGNOSIS — E875 Hyperkalemia: Secondary | ICD-10-CM | POA: Diagnosis not present

## 2014-06-15 DIAGNOSIS — K219 Gastro-esophageal reflux disease without esophagitis: Secondary | ICD-10-CM

## 2014-06-15 DIAGNOSIS — I482 Chronic atrial fibrillation, unspecified: Secondary | ICD-10-CM

## 2014-06-15 DIAGNOSIS — F32A Depression, unspecified: Secondary | ICD-10-CM

## 2014-06-15 DIAGNOSIS — K5900 Constipation, unspecified: Secondary | ICD-10-CM | POA: Diagnosis not present

## 2014-06-15 DIAGNOSIS — S32020D Wedge compression fracture of second lumbar vertebra, subsequent encounter for fracture with routine healing: Secondary | ICD-10-CM

## 2014-06-15 LAB — BASIC METABOLIC PANEL
BUN: 42 mg/dL — AB (ref 4–21)
Creatinine: 1.7 mg/dL — AB (ref 0.5–1.1)
Glucose: 124 mg/dL
Potassium: 5.5 mmol/L — AB (ref 3.4–5.3)
SODIUM: 140 mmol/L (ref 137–147)

## 2014-06-15 NOTE — Assessment & Plan Note (Signed)
Heart rate is in control. Continue Digoxin Dig update Digoxin level

## 2014-06-15 NOTE — Assessment & Plan Note (Signed)
continue her MiraLAX and make sure she is having bowel movements. Prn MiraLax available too.

## 2014-06-15 NOTE — Assessment & Plan Note (Signed)
No further C/o nausea. Continue Omeprazole. Zofran prn available to her.

## 2014-06-15 NOTE — Assessment & Plan Note (Signed)
Chronic elevated SBP--takes Clonidine 0.1mg  daily, Hydralazine 50mg  bid, Metoprolol 75mg  bid.

## 2014-06-15 NOTE — Assessment & Plan Note (Signed)
Stable: sleeps well at night. Weights stabilized. Continue Remeron 7.5mg nightly. Continue Lorazepam daily and bid prn. 

## 2014-06-15 NOTE — Progress Notes (Signed)
Patient ID: [name], female   DOB: 04/08/1919, 81 y.o.   MRN: [id number]    Code Status: DNR  Allergies  Allergen Reactions  . Codeine Other (See Comments)    Unknown reaction. Info from a MAR.  Marland Kitchen Codeine     Unknown.    Chief Complaint  Patient presents with  . Medical Management of Chronic Issues  . Acute Visit    K 5.5, creatinine 1.68 06/15/14    HPI: Patient is a 81 y.o. female seen in the SNF at [facility] today for evaluation of mild hyperkalemia, renal insufficiency, and other chronic medical conditions.    Hospitalized 05/31/2014-06/02/2014 for Compression fracture of L2 lumbar vertebra with routine healing, Hypothyroidism, Hypertensive urgency, CKD (chronic kidney disease) stage 3, GFR 30-59 ml/min, Pancreatitis   CT abdomen and pelvis done shows multiple thoracic compression fractures and pancreas shows possible pancreatitis-moist likely the cause of abd pain for this hospital stay.    Problem List Items Addressed This Visit    Renal insufficiency    06/15/14 Bun/creat 42/1.68-encourage oral fluid intake-dc Tramadol. Update BMP       Hyponatremia    10/12/13 Na 135 11/23/13 Na 137 06/07/14 Na 128 Taking Demeclocycline 150mg  bid.  06/15/14 Na 140      Hypokalemia    11/23/13 K 3.4-Kcl - 12/02/13 K 4.2 06/14/13 K 5.5-dc Kcl        Hyperkalemia - Primary    06/15/14 K 5.5-dc Kcl 93meq-f/u BMP       GERD (gastroesophageal reflux disease)    No further C/o nausea. Continue Omeprazole. Zofran prn available to her.         Essential hypertension (Chronic)    Chronic elevated SBP--takes Clonidine 0.1mg  daily, Hydralazine 50mg  bid, Metoprolol 75mg  bid.         Depression    Stable: sleeps well at night. Weights stabilized. Continue Remeron 7.5mg  nightly. Continue Lorazepam daily and bid prn.       Constipation    continue her MiraLAX and make sure she is having bowel movements. Prn MiraLax available too.         Compression  fracture of L2 lumbar vertebra with routine healing    Most likely due to thoracic compression fractures, she does have mild compression fracture of T11 and L2. - Discussed the case with neurosurgery Dr. Lovell Sheehan and he recommended narcotics and a brace - Physical therapy  - Her pain was controlled with Vicodin and fentanyl. Dc Tramadol in am.         A-fib    Heart rate is in control. Continue Digoxin Dig update Digoxin level         Review of Systems:  Review of Systems  Constitutional: Positive for weight loss and malaise/fatigue. Negative for fever, chills and diaphoresis.  HENT: Positive for hearing loss. Negative for congestion, ear discharge, ear pain, nosebleeds, sore throat and tinnitus.   Eyes: Negative for blurred vision, double vision, photophobia, pain, discharge and redness.  Respiratory: Negative for cough, hemoptysis, sputum production, shortness of breath, wheezing and stridor.   Cardiovascular: Negative for chest pain, palpitations, orthopnea, claudication, leg swelling and PND.  Gastrointestinal: Negative for heartburn, nausea, vomiting, abdominal pain, diarrhea, constipation, blood in stool and melena.  Genitourinary: Positive for frequency. Negative for dysuria, urgency, hematuria and flank pain.  Musculoskeletal: Positive for back pain. Negative for myalgias, joint pain, falls and neck pain.       Much improved  Skin: Negative for itching and  rash.  Neurological: Positive for weakness. Negative for dizziness, tingling, tremors, sensory change, speech change, focal weakness, seizures, loss of consciousness and headaches.  Endo/Heme/Allergies: Negative for environmental allergies and polydipsia. Does not bruise/bleed easily.  Psychiatric/Behavioral: Positive for depression and memory loss. Negative for suicidal ideas, hallucinations and substance abuse. The patient is nervous/anxious. The patient does not have insomnia.      Past Medical History  Diagnosis Date    . Anxiety   . GERD (gastroesophageal reflux disease)   . Anemia   . Hyperlipidemia   . Back pain   . Hypertension   . Hypothyroidism   . Atrial fibrillation   . Hyponatremia     hx of  . Syncope     hx syncope and collapse per paper from [facility]  . Unconscious 04/06/12    per [facility] paper -hx unconscious episodes -pt asked not to be sent to hospital for these  . Fx lumbar vertebra-closed     hx of per [facility]  . Lower back pain     hx of   Social History:   reports that she has never smoked. She has never used smokeless tobacco. She reports that she does not drink alcohol or use illicit drugs.   Medications: Patient's Medications  New Prescriptions   No medications on file  Previous Medications   ACETAMINOPHEN (TYLENOL) 325 MG TABLET    Take 650 mg by mouth every 4 (four) hours as needed for mild pain.   ACETAMINOPHEN (TYLENOL) 325 MG TABLET    Take 650 mg by mouth every 4 (four) hours as needed for mild pain or fever.   BRIMONIDINE (ALPHAGAN) 0.2 % OPHTHALMIC SOLUTION    One drop both eyes twice daily   BRIMONIDINE (ALPHAGAN) 0.2 % OPHTHALMIC SOLUTION    Place 1 drop into both eyes 2 (two) times daily.   CHOLECALCIFEROL (VITAMIN D) 1000 UNITS TABLET    Take 2,000 Units by mouth daily.   CHOLECALCIFEROL (VITAMIN D) 2000 UNITS TABLET    Take 2,000 Units by mouth every morning.   CLONIDINE (CATAPRES) 0.1 MG TABLET    Take 1 tablet (0.1 mg total) by mouth daily.   CLONIDINE (CATAPRES) 0.1 MG TABLET    Take 0.1 mg by mouth daily.   CYANOCOBALAMIN 500 MCG TABLET    Take 500 mcg by mouth daily.   DEMECLOCYCLINE (DECLOMYCIN) 150 MG TABLET    Take 150 mg by mouth 2 (two) times daily.   DEMECLOCYCLINE (DECLOMYCIN) 150 MG TABLET    Take 150 mg by mouth 2 (two) times daily.   DIGOXIN (LANOXIN) 0.125 MG TABLET    Take 0.125 mg by mouth daily. Take 1/2 tablet every morning * check pulse daily*   DIGOXIN (LANOXIN) 0.125 MG TABLET    Take 0.0625 mg by mouth  daily.   FENTANYL (DURAGESIC - DOSED MCG/HR) 25 MCG/HR PATCH    Place 1 patch (25 mcg total) onto the skin every 3 (three) days.   FENTANYL (DURAGESIC) 25 MCG/HR PATCH    Place 1 patch (25 mcg total) onto the skin every 3 (three) days.   HYDRALAZINE (APRESOLINE) 50 MG TABLET    Take 50 mg by mouth 2 (two) times daily.   HYDRALAZINE (APRESOLINE) 50 MG TABLET    Take 50 mg by mouth 2 (two) times daily.   HYDROCODONE-ACETAMINOPHEN (NORCO) 7.5-325 MG PER TABLET    Take 1 tablet by mouth every 6 (six) hours as needed for moderate pain.  HYDROCODONE-ACETAMINOPHEN (NORCO) 7.5-325 MG PER TABLET    Take 1 tablet by mouth every 6 (six) hours as needed for moderate pain.   KLOR-CON M10 10 MEQ TABLET    Take one tablet daily   LORAZEPAM (ATIVAN) 0.5 MG TABLET    Take 1 tablet (0.5 mg total) by mouth every 8 (eight) hours as needed for anxiety. Take one tablet every morning; take one twice daily as needed for anxiety   LORAZEPAM (ATIVAN) 0.5 MG TABLET    Take 0.5 mg by mouth 2 (two) times daily as needed for anxiety.   LORAZEPAM (ATIVAN) 0.5 MG TABLET    Take 1 tablet (0.5 mg total) by mouth every morning.   MAGNESIUM HYDROXIDE (MILK OF MAGNESIA) 400 MG/5ML SUSPENSION    Take 30 mLs by mouth daily as needed. For constipation   MAGNESIUM HYDROXIDE (MILK OF MAGNESIA) 400 MG/5ML SUSPENSION    Take 30 mLs by mouth daily as needed for mild constipation.   METOPROLOL TARTRATE (LOPRESSOR) 25 MG TABLET    Take 3 tablets (75 mg total) by mouth 2 (two) times daily.   METOPROLOL TARTRATE (LOPRESSOR) 25 MG TABLET    Take 75 mg by mouth 2 (two) times daily.   MIRTAZAPINE (REMERON SOL-TAB) 15 MG DISINTEGRATING TABLET    Take 7.5 mg by mouth at bedtime.   MIRTAZAPINE (REMERON) 7.5 MG TABLET    Take 7.5 mg by mouth at bedtime.   OMEPRAZOLE (PRILOSEC) 20 MG CAPSULE    Take 20 mg by mouth every morning. * Do Not Crush* Take on a empty stomach.   OMEPRAZOLE (PRILOSEC) 20 MG CAPSULE    Take 1 capsule by mouth every morning.    ONDANSETRON (ZOFRAN) 4 MG TABLET    Take 4 mg by mouth 3 (three) times daily as needed for nausea or vomiting.   ONDANSETRON (ZOFRAN) 4 MG TABLET    Take 4 mg by mouth every 8 (eight) hours as needed for nausea or vomiting.   POLYETHYLENE GLYCOL (MIRALAX / GLYCOLAX) PACKET    Take 17 g by mouth daily as needed for moderate constipation.   POLYETHYLENE GLYCOL (MIRALAX / GLYCOLAX) PACKET    Take 17 g by mouth daily.   POLYETHYLENE GLYCOL (MIRALAX / GLYCOLAX) PACKET    Take 17 g by mouth daily as needed.   POLYETHYLENE GLYCOL (MIRALAX) PACKET    Take 17 g by mouth daily.   POTASSIUM CHLORIDE (K-DUR,KLOR-CON) 10 MEQ TABLET    Take 10 mEq by mouth daily.   TRAMADOL (ULTRAM) 50 MG TABLET    Take 1 tablet (50 mg total) by mouth daily.   TRAMADOL (ULTRAM) 50 MG TABLET    Take 1 tablet (50 mg total) by mouth daily.   VITAMIN B-12 (CYANOCOBALAMIN) 500 MCG TABLET    Take 500 mcg by mouth every morning.  Modified Medications   No medications on file  Discontinued Medications   No medications on file     Physical Exam: Physical Exam  Constitutional: She is oriented to person, place, and time. She appears well-developed and well-nourished. No distress.  HENT:  Head: Normocephalic and atraumatic.  Mouth/Throat: No oropharyngeal exudate.  Eyes: Conjunctivae and EOM are normal. Pupils are equal, round, and reactive to light. Right eye exhibits no discharge. Left eye exhibits no discharge.  Neck: Normal range of motion. Neck supple. No JVD present. No thyromegaly present.  Cardiovascular: Normal rate, regular rhythm and normal heart sounds.   No murmur heard. Pulmonary/Chest: Effort normal and breath sounds  normal. No respiratory distress. She has no wheezes. She has no rales.  Abdominal: Soft. Bowel sounds are normal. She exhibits no distension. There is no tenderness.  Musculoskeletal: She exhibits tenderness (lower back). She exhibits no edema.  Back pain is managed with Fentanyl patch and lumbar  brace  Lymphadenopathy:    She has no cervical adenopathy.  Neurological: She is alert and oriented to person, place, and time. She has normal reflexes. No cranial nerve deficit. She exhibits normal muscle tone. Coordination normal.  Skin: Skin is warm and dry. No rash noted. She is not diaphoretic. No erythema.  Psychiatric: Her mood appears not anxious. Her affect is not angry, not blunt, not labile and not inappropriate. Her speech is not rapid and/or pressured, not delayed, not tangential and not slurred. She is slowed. She is not agitated, not aggressive, not hyperactive, not withdrawn, not actively hallucinating and not combative. Thought content is not paranoid and not delusional. Cognition and memory are impaired. She does not exhibit a depressed mood. She is communicative. She exhibits abnormal recent memory.    Filed Vitals:   06/15/14 1125  BP: 118/68  Pulse: 74  Temp: 98 F (36.7 C)  TempSrc: Tympanic  Resp: 18      Labs reviewed: Basic Metabolic Panel:  Recent Labs  96/04/54  05/31/14 2359  06/08/14 0400 06/09/14 0354 06/10/14 0645 06/13/14 06/15/14  NA 140  < >  --   < > 135 134* 137 140 140  K 4.5  < >  --   < > 4.0 3.4* 4.3 4.3 5.5*  CL  --   < >  --   < > 100 100 102  --   --   CO2  --   < >  --   < > --   --   GLUCOSE  --   < >  --   < > 89 93 118*  --   --   BUN 27*  < >  --   < > 29* 21 21 36* 42*  CREATININE 1.3*  < > 1.17*  < > 1.00 0.91 0.99 1.3* 1.7*  CALCIUM  --   < >  --   < > 8.6 8.3* 8.8  --   --   TSH 2.01  --  1.457  --   --   --   --   --   --   < > = values in this interval not displayed. Liver Function Tests:  Recent Labs  06/01/14 0510  06/07/14 1651 06/08/14 0400 06/13/14  AST 18  < > ALT 9  < > ALKPHOS 78  < > 134* 115 138*  BILITOT 0.9  --  0.7 0.6  --   PROT 6.2  --  6.9 6.2  --   ALBUMIN 3.2*  --  3.6 3.2*  --   < > = values in this interval not displayed.  Recent Labs  05/31/14 2052  06/01/14 0510 06/07/14 1651  LIPASE CBC:  Recent Labs  06/01/14 0510  06/07/14 1651  06/08/14 0927 06/09/14 0354 06/10/14 0645  WBC 7.5  < > 10.9*  < > 8.0 7.8 11.6*  NEUTROABS 5.0  --  8.2*  --  5.4  --   --   HGB 10.9*  < > 12.7  < > 10.9* 11.7* 12.3  HCT 33.1*  < > 38.1  < >  33.4* 35.8* 36.9  MCV 97.6  --  95.3  < > 96.0 96.2 96.6  PLT 241  < > 240  < > 215 216 268  < > = values in this interval not displayed. Lipase 12/15/12 28  Amylase 12/15/12 71  Assessment/Plan Hypokalemia 11/23/13 K 3.4-Kcl 10meq- 12/02/13 K 4.2 06/14/13 K 5.5-dc Kcl     Hyperkalemia 06/15/14 K 5.5-dc Kcl 310meq-f/u BMP    Renal insufficiency 06/15/14 Bun/creat 42/1.68-encourage oral fluid intake-dc Tramadol. Update BMP    Compression fracture of L2 lumbar vertebra with routine healing Most likely due to thoracic compression fractures, she does have mild compression fracture of T11 and L2. - Discussed the case with neurosurgery Dr. Lovell SheehanJenkins and he recommended narcotics and a brace - Physical therapy  - Her pain was controlled with Vicodin and fentanyl. Dc Tramadol in am.      Essential hypertension Chronic elevated SBP--takes Clonidine 0.1mg  daily, Hydralazine 50mg  bid, Metoprolol 75mg  bid.      Depression Stable: sleeps well at night. Weights stabilized. Continue Remeron 7.5mg  nightly. Continue Lorazepam daily and bid prn.    Hyponatremia 10/12/13 Na 135 11/23/13 Na 137 06/07/14 Na 128 Taking Demeclocycline 150mg  bid.  06/15/14 Na 140   GERD (gastroesophageal reflux disease) No further C/o nausea. Continue Omeprazole. Zofran prn available to her.      Constipation continue her MiraLAX and make sure she is having bowel movements. Prn MiraLax available too.      A-fib Heart rate is in control. Continue Digoxin Dig update Digoxin level     Family/ Staff Communication: observe the patient.   Goals of Care: SNF  Labs/tests ordered:  BMP and digoxin level.

## 2014-06-15 NOTE — Assessment & Plan Note (Signed)
Most likely due to thoracic compression fractures, she does have mild compression fracture of T11 and L2. - Discussed the case with neurosurgery Dr. Lovell SheehanJenkins and he recommended narcotics and a brace - Physical therapy  - Her pain was controlled with Vicodin and fentanyl. Dc Tramadol in am.

## 2014-06-15 NOTE — Assessment & Plan Note (Signed)
06/15/14 K 5.5-dc Kcl 8210meq-f/u BMP

## 2014-06-15 NOTE — Assessment & Plan Note (Signed)
10/12/13 Na 135 11/23/13 Na 137 06/07/14 Na 128 Taking Demeclocycline 150mg  bid.  06/15/14 Na 140

## 2014-06-15 NOTE — Assessment & Plan Note (Signed)
11/23/13 K 3.4-Kcl 10meq- 12/02/13 K 4.2 06/14/13 K 5.5-dc Kcl

## 2014-06-15 NOTE — Assessment & Plan Note (Signed)
06/15/14 Bun/creat 42/1.68-encourage oral fluid intake-dc Tramadol. Update BMP

## 2014-06-16 ENCOUNTER — Encounter: Payer: Medicare Other | Admitting: Internal Medicine

## 2014-06-17 ENCOUNTER — Encounter: Payer: Self-pay | Admitting: Internal Medicine

## 2014-06-17 ENCOUNTER — Non-Acute Institutional Stay (SKILLED_NURSING_FACILITY): Payer: Medicare Other | Admitting: Internal Medicine

## 2014-06-17 DIAGNOSIS — N183 Chronic kidney disease, stage 3 unspecified: Secondary | ICD-10-CM

## 2014-06-17 DIAGNOSIS — E871 Hypo-osmolality and hyponatremia: Secondary | ICD-10-CM | POA: Diagnosis not present

## 2014-06-17 DIAGNOSIS — E875 Hyperkalemia: Secondary | ICD-10-CM | POA: Diagnosis not present

## 2014-06-17 DIAGNOSIS — S32020D Wedge compression fracture of second lumbar vertebra, subsequent encounter for fracture with routine healing: Secondary | ICD-10-CM | POA: Diagnosis not present

## 2014-06-17 DIAGNOSIS — I482 Chronic atrial fibrillation, unspecified: Secondary | ICD-10-CM

## 2014-06-17 DIAGNOSIS — I10 Essential (primary) hypertension: Secondary | ICD-10-CM

## 2014-06-17 DIAGNOSIS — E43 Unspecified severe protein-calorie malnutrition: Secondary | ICD-10-CM

## 2014-06-17 DIAGNOSIS — N289 Disorder of kidney and ureter, unspecified: Secondary | ICD-10-CM

## 2014-06-17 DIAGNOSIS — E876 Hypokalemia: Secondary | ICD-10-CM | POA: Diagnosis not present

## 2014-06-17 NOTE — Progress Notes (Signed)
Patient ID: [name], female   DOB: [birth year], 81 y.o.   MRN: [id number]    HISTORY AND PHYSICAL  Location:  [place] Room Number: 1 Place of Service: SNF (31)   Extended Emergency Contact Information Primary Emergency Contact: [place] of [place] Phone: [phone] Relation: Relative Secondary Emergency Contact: [facility] Address: [address] [place], [place] [address] Johnnette Litter of [place] Phone: [phone] Mobile Phone: [phone] Relation: Daughter  Advanced Directive information Does patient have an advance directive?: Yes, Type of Advance Directive: Living will;Out of facility DNR (pink MOST or yellow form) (Patient has indicated that she does not want to be rehospitalized), Pre-existing out of facility DNR order (yellow form or pink MOST form): Yellow form placed in chart (order not valid for inpatient use), Does patient want to make changes to advanced directive?: No - Patient declined  Chief Complaint  Patient presents with  . New Admit To SNF    Following hospitalization    HPI:  Readmitted to the skilled nursing facility 06/10/14 following hospitalization from 06/07/14 through 06/10/2014.  Patient was hospitalized for right upper quadrant abdominal pain. She has had a previous attack of pancreatitis. Does not appear that this was a pancreatitis attack. Etiology was not established. Speculation was that the pain might be related to multiple fractures of the thoracic spine including T7, T9, T11, and L2. There was a consultation with a neurosurgeon, Dr. Arnoldo Morale, who recommended conservative treatment. She is wearing a brace of her trunk anterior and posterior which was recommended during her hospitalization.  Hyponatremia was treated with demeclocycline.  Renal insufficiency was noted. She has a chronic kidney disease documented for several years.  She has severe protein  calorie malnutrition insteps of intake and to increase caloric intake.  Patient is quite weak and needs help with bed mobility, transfers, and walking. She has made progress through physical therapy since return from the hospital.  Abdominal aortic aneurysm was measured at 3.1 cm. This is not curative been the source of her discomfort.  Other problems include hypertension which is controlled, asymptomatic GERD, aortic atherosclerosis and iliac atherosclerosis, diverticulosis, right inguinal hernia, and an unstable gait.  She was nauseous during her hospital stay but says that this passed and she no longer is having trouble.  Pains in her back and abdomen are now controlled.  Patient was admitted to skilled nursing facility for short-term rehabilitation. Sent despite her that she'll be able to return to home. Since she returned here on 06/10/2014, she has gained strength, nausea has resolved, and pains in the back and abdomen are controlled. She is progressing less so with physical therapy towards independence.  Past Medical History  Diagnosis Date  . Anxiety   . GERD (gastroesophageal reflux disease)   . Anemia   . Hyperlipidemia   . Back pain   . Hypertension   . Hypothyroidism   . Atrial fibrillation   . Hyponatremia     hx of  . Syncope     hx syncope and collapse per paper from [facility]  . Unconscious 04/06/12    per [facility] home paper -hx unconscious episodes -pt asked not to be sent to hospital for these  . Fx lumbar vertebra-closed     [facility]  . Lower back pain     hx of  . Renal insufficiency 06/07/2014  .  A-fib 12/16/2012    12/28/13 Digoxin level 0.5   . Abdominal pain 05/31/2014  . CKD (chronic kidney disease) stage 3, GFR 30-59 ml/min 05/31/2014  . Compression fracture of L2 lumbar vertebra with routine healing 06/02/2014     Most likely due to thoracic compression fractures, she does have mild compression fracture of T11 and L2. -  Discussed the case with neurosurgery Dr. Arnoldo Morale and he recommended narcotics and a brace - Physical therapy evaluated the patient and recommended home PT - Her pain was controlled with Vicodin and fentanyl.    . Compression fracture of thoracic vertebra, non-traumatic 06/02/2014  . Constipation 12/16/2012  . Depression 08/19/2012    Mirtazapine 7.61m nightly helped.    .Marland KitchenHx of pancreatitis 04/06/2012    10/12/13 Amylase 50(0-105) Lipase 11(0-75) 11/23/13 Amylase 39. Lipase 14.    . Protein-calorie malnutrition, severe 06/08/2014    Past Surgical History  Procedure Laterality Date  . Abdominal hysterectomy    . Cataract extraction w/ intraocular lens  implant, bilateral    . Abdominal hysterectomy  1987  . Tonsillectomy and adenoidectomy    . Breast lumpectomy    . Colonoscopy  2003    Dr. EOletta Lamas   Patient Care Team: AEstill Dooms MD as PCP - General (Internal Medicine) AEstill Dooms MD (Internal Medicine)  History   Social History  . Marital Status: Widowed    Spouse Name: N/A  . Number of Children: N/A  . Years of Education: N/A   Occupational History  . retired nMarine scientist   Social History Main Topics  . Smoking status: Never Smoker   . Smokeless tobacco: Never Used  . Alcohol Use: No  . Drug Use: No  . Sexual Activity: No   Other Topics Concern  . Not on file   Social History Narrative   ** Merged History Encounter **       [facility]   [facility]   Never smoked   Alcohol none   Exercise none   No siblings   Children one step daughter    Living Will/POA/DNR                    reports that she has never smoked. She has never used smokeless tobacco. She reports that she does not drink alcohol or use illicit drugs.  Family History  Problem Relation Age of Onset  . Bleeding Disorder Father     "died of blood poisoning"   Family Status  Relation Status Death Age  . Mother Deceased     child brith  . Father  Deceased 335   Sepsis  . Daughter Alive     Immunization History  Administered Date(s) Administered  . Influenza-Unspecified 02/18/2013, 02/17/2014    Allergies  Allergen Reactions  . Codeine Other (See Comments)    Unknown reaction. Info from a MAR.  .Marland KitchenCodeine     Unknown.    Medications: Patient's Medications  New Prescriptions   No medications on file  Previous Medications   ACETAMINOPHEN (TYLENOL) 325 MG TABLET    Take 650 mg by mouth every 4 (four) hours as needed for mild pain or fever.   BRIMONIDINE (ALPHAGAN) 0.2 % OPHTHALMIC SOLUTION    Place 1 drop into both eyes 2 (two) times daily.   CHOLECALCIFEROL (VITAMIN D) 2000 UNITS TABLET    Take 2,000 Units by mouth every morning.   CLONIDINE (CATAPRES)  0.1 MG TABLET    Take 0.1 mg by mouth daily.   CYANOCOBALAMIN 500 MCG TABLET    Take 500 mcg by mouth daily.   DEMECLOCYCLINE (DECLOMYCIN) 150 MG TABLET    Take 150 mg by mouth 2 (two) times daily.   DIGOXIN (LANOXIN) 0.125 MG TABLET    Take 0.125 mg by mouth daily. Take 1/2 tablet every morning * check pulse daily*   FENTANYL (DURAGESIC) 25 MCG/HR PATCH    Place 1 patch (25 mcg total) onto the skin every 3 (three) days.   HYDRALAZINE (APRESOLINE) 50 MG TABLET    Take 50 mg by mouth 2 (two) times daily.   HYDROCODONE-ACETAMINOPHEN (NORCO) 7.5-325 MG PER TABLET    Take 1 tablet by mouth every 6 (six) hours as needed for moderate pain.   KLOR-CON M10 10 MEQ TABLET    Take one tablet daily   LORAZEPAM (ATIVAN) 0.5 MG TABLET    Take 0.5 mg by mouth 2 (two) times daily as needed for anxiety.   LORAZEPAM (ATIVAN) 0.5 MG TABLET    Take 1 tablet (0.5 mg total) by mouth every morning.   MAGNESIUM HYDROXIDE (MILK OF MAGNESIA) 400 MG/5ML SUSPENSION    Take 30 mLs by mouth daily as needed for mild constipation.   METOPROLOL TARTRATE (LOPRESSOR) 25 MG TABLET    Take 3 tablets (75 mg total) by mouth 2 (two) times daily.   MIRTAZAPINE (REMERON) 7.5 MG TABLET    Take 7.5 mg by mouth at  bedtime.   OMEPRAZOLE (PRILOSEC) 20 MG CAPSULE    Take 1 capsule by mouth every morning.   ONDANSETRON (ZOFRAN) 4 MG TABLET    Take 4 mg by mouth every 8 (eight) hours as needed for nausea or vomiting.   POLYETHYLENE GLYCOL (MIRALAX / GLYCOLAX) PACKET    Take 17 g by mouth daily as needed for moderate constipation.   POLYETHYLENE GLYCOL (MIRALAX) PACKET    Take 17 g by mouth daily.   POTASSIUM CHLORIDE (K-DUR,KLOR-CON) 10 MEQ TABLET    Take 10 mEq by mouth daily.   TRAMADOL (ULTRAM) 50 MG TABLET    Take 1 tablet (50 mg total) by mouth daily.   VITAMIN B-12 (CYANOCOBALAMIN) 500 MCG TABLET    Take 500 mcg by mouth every morning.  Modified Medications   No medications on file  Discontinued Medications   ACETAMINOPHEN (TYLENOL) 325 MG TABLET    Take 650 mg by mouth every 4 (four) hours as needed for mild pain.   BRIMONIDINE (ALPHAGAN) 0.2 % OPHTHALMIC SOLUTION    One drop both eyes twice daily   CHOLECALCIFEROL (VITAMIN D) 1000 UNITS TABLET    Take 2,000 Units by mouth daily.   CLONIDINE (CATAPRES) 0.1 MG TABLET    Take 1 tablet (0.1 mg total) by mouth daily.   DEMECLOCYCLINE (DECLOMYCIN) 150 MG TABLET    Take 150 mg by mouth 2 (two) times daily.   DIGOXIN (LANOXIN) 0.125 MG TABLET    Take 0.0625 mg by mouth daily.   FENTANYL (DURAGESIC - DOSED MCG/HR) 25 MCG/HR PATCH    Place 1 patch (25 mcg total) onto the skin every 3 (three) days.   HYDRALAZINE (APRESOLINE) 50 MG TABLET    Take 50 mg by mouth 2 (two) times daily.   HYDROCODONE-ACETAMINOPHEN (NORCO) 7.5-325 MG PER TABLET    Take 1 tablet by mouth every 6 (six) hours as needed for moderate pain.   LORAZEPAM (ATIVAN) 0.5 MG TABLET    Take 1 tablet (  0.5 mg total) by mouth every 8 (eight) hours as needed for anxiety. Take one tablet every morning; take one twice daily as needed for anxiety   MAGNESIUM HYDROXIDE (MILK OF MAGNESIA) 400 MG/5ML SUSPENSION    Take 30 mLs by mouth daily as needed. For constipation   METOPROLOL TARTRATE (LOPRESSOR) 25 MG  TABLET    Take 75 mg by mouth 2 (two) times daily.   MIRTAZAPINE (REMERON SOL-TAB) 15 MG DISINTEGRATING TABLET    Take 7.5 mg by mouth at bedtime.   OMEPRAZOLE (PRILOSEC) 20 MG CAPSULE    Take 20 mg by mouth every morning. * Do Not Crush* Take on a empty stomach.   ONDANSETRON (ZOFRAN) 4 MG TABLET    Take 4 mg by mouth 3 (three) times daily as needed for nausea or vomiting.   POLYETHYLENE GLYCOL (MIRALAX / GLYCOLAX) PACKET    Take 17 g by mouth daily.   POLYETHYLENE GLYCOL (MIRALAX / GLYCOLAX) PACKET    Take 17 g by mouth daily as needed.   TRAMADOL (ULTRAM) 50 MG TABLET    Take 1 tablet (50 mg total) by mouth daily.    Review of Systems  Constitutional: Positive for fatigue and unexpected weight change. Negative for fever, chills and diaphoresis.  HENT: Positive for hearing loss. Negative for congestion, ear discharge, ear pain, nosebleeds, sore throat and tinnitus.   Eyes: Negative for photophobia, pain, discharge and redness.  Respiratory: Negative for cough, shortness of breath, wheezing and stridor.   Cardiovascular: Negative for chest pain, palpitations and leg swelling.  Gastrointestinal: Negative for nausea, vomiting, abdominal pain, diarrhea, constipation and blood in stool.  Endocrine: Negative for polydipsia.  Genitourinary: Positive for frequency. Negative for dysuria, urgency, hematuria and flank pain.  Musculoskeletal: Positive for back pain and gait problem. Negative for myalgias and neck pain.       Much improved  Skin: Negative for rash.  Allergic/Immunologic: Negative for environmental allergies.  Neurological: Positive for weakness. Negative for dizziness, tremors, seizures and headaches.  Hematological: Does not bruise/bleed easily.  Psychiatric/Behavioral: Negative for suicidal ideas and hallucinations. The patient is nervous/anxious.     Filed Vitals:   06/17/14 1603  BP: 132/90  Pulse: 76  Temp: 99.5 F (37.5 C)  Resp: 20  Height: 5' 5"  (1.651 m)  Weight: 94  lb 6.4 oz (42.82 kg)   Body mass index is 15.71 kg/(m^2).  Physical Exam  Constitutional: She is oriented to person, place, and time. She appears well-developed and well-nourished. No distress.  HENT:  Head: Normocephalic and atraumatic.  Mouth/Throat: No oropharyngeal exudate.  Eyes: Conjunctivae and EOM are normal. Pupils are equal, round, and reactive to light. Right eye exhibits no discharge. Left eye exhibits no discharge.  Neck: Normal range of motion. Neck supple. No JVD present. No thyromegaly present.  Cardiovascular: Normal rate, regular rhythm and normal heart sounds.   No murmur heard. Pulmonary/Chest: Effort normal and breath sounds normal. No respiratory distress. She has no wheezes. She has no rales.  Abdominal: Soft. Bowel sounds are normal. She exhibits no distension. There is no tenderness.  Right inguinal hernia  Musculoskeletal: She exhibits tenderness (lower back). She exhibits no edema.  Back pain is managed with Fentanyl patch and lumbar brace  Lymphadenopathy:    She has no cervical adenopathy.  Neurological: She is alert and oriented to person, place, and time. She has normal reflexes. No cranial nerve deficit. She exhibits normal muscle tone. Coordination normal.  Skin: Skin is warm and dry. No  rash noted. She is not diaphoretic. No erythema.  Psychiatric: Her mood appears not anxious. Her affect is not angry, not blunt, not labile and not inappropriate. Her speech is not rapid and/or pressured, not delayed, not tangential and not slurred. She is slowed. She is not agitated, not aggressive, not hyperactive, not withdrawn, not actively hallucinating and not combative. Thought content is not paranoid and not delusional. Cognition and memory are impaired. She does not exhibit a depressed mood. She is communicative. She exhibits abnormal recent memory.     Labs reviewed: Nursing Home on 06/15/2014  Component Date Value Ref Range Status  . Glucose 06/15/2014 124    Final  . BUN 06/15/2014 42* 4 - 21 mg/dL Final  . Creatinine 06/15/2014 1.7* 0.5 - 1.1 mg/dL Final  . Potassium 06/15/2014 5.5* 3.4 - 5.3 mmol/L Final  . Sodium 06/15/2014 140  137 - 147 mmol/L Final  Lab on 06/13/2014  Component Date Value Ref Range Status  . Glucose 06/13/2014 93   Final  . BUN 06/13/2014 36* 4 - 21 mg/dL Final  . Creatinine 06/13/2014 1.3* 0.5 - 1.1 mg/dL Final  . Potassium 06/13/2014 4.3  3.4 - 5.3 mmol/L Final  . Sodium 06/13/2014 140  137 - 147 mmol/L Final  . Alkaline Phosphatase 06/13/2014 138* 25 - 125 U/L Final  . ALT 06/13/2014 10  7 - 35 U/L Final  . AST 06/13/2014 17  13 - 35 U/L Final  . Bilirubin, Total 06/13/2014 0.5   Final  Admission on 06/07/2014, Discharged on 06/10/2014  Component Date Value Ref Range Status  . Sodium 06/07/2014 131* 135 - 145 mmol/L Final  . Potassium 06/07/2014 4.7  3.5 - 5.1 mmol/L Final  . Chloride 06/07/2014 94* 96 - 112 mmol/L Final  . CO2 06/07/2014 25  19 - 32 mmol/L Final  . Glucose, Bld 06/07/2014 113* 70 - 99 mg/dL Final  . BUN 06/07/2014 42* 6 - 23 mg/dL Final  . Creatinine, Ser 06/07/2014 1.54* 0.50 - 1.10 mg/dL Final  . Calcium 06/07/2014 9.2  8.4 - 10.5 mg/dL Final  . Total Protein 06/07/2014 6.9  6.0 - 8.3 g/dL Final  . Albumin 06/07/2014 3.6  3.5 - 5.2 g/dL Final  . AST 06/07/2014 24  0 - 37 U/L Final  . ALT 06/07/2014 15  0 - 35 U/L Final  . Alkaline Phosphatase 06/07/2014 134* 39 - 117 U/L Final  . Total Bilirubin 06/07/2014 0.7  0.3 - 1.2 mg/dL Final  . GFR calc non Af Amer 06/07/2014 28* >90 mL/min Final  . GFR calc Af Amer 06/07/2014 32* >90 mL/min Final   Comment: (NOTE) The eGFR has been calculated using the CKD EPI equation. This calculation has not been validated in all clinical situations. eGFR's persistently <90 mL/min signify possible Chronic Kidney Disease.   . Anion gap 06/07/2014 12  5 - 15 Final  . WBC 06/07/2014 10.9* 4.0 - 10.5 K/uL Final  . RBC 06/07/2014 4.00  3.87 - 5.11 MIL/uL Final   . Hemoglobin 06/07/2014 12.7  12.0 - 15.0 g/dL Final  . HCT 06/07/2014 38.1  36.0 - 46.0 % Final  . MCV 06/07/2014 95.3  78.0 - 100.0 fL Final  . MCH 06/07/2014 31.8  26.0 - 34.0 pg Final  . MCHC 06/07/2014 33.3  30.0 - 36.0 g/dL Final  . RDW 06/07/2014 14.1  11.5 - 15.5 % Final  . Platelets 06/07/2014 240  150 - 400 K/uL Final  . Neutrophils Relative % 06/07/2014 76  43 - 77 % Final  . Neutro Abs 06/07/2014 8.2* 1.7 - 7.7 K/uL Final  . Lymphocytes Relative 06/07/2014 16  12 - 46 % Final  . Lymphs Abs 06/07/2014 1.8  0.7 - 4.0 K/uL Final  . Monocytes Relative 06/07/2014 8  3 - 12 % Final  . Monocytes Absolute 06/07/2014 0.9  0.1 - 1.0 K/uL Final  . Eosinophils Relative 06/07/2014 0  0 - 5 % Final  . Eosinophils Absolute 06/07/2014 0.0  0.0 - 0.7 K/uL Final  . Basophils Relative 06/07/2014 0  0 - 1 % Final  . Basophils Absolute 06/07/2014 0.0  0.0 - 0.1 K/uL Final  . Lactic Acid, Venous 06/07/2014 1.64  0.5 - 2.0 mmol/L Final  . Sodium 06/07/2014 128* 135 - 145 mmol/L Final  . Potassium 06/07/2014 4.7  3.5 - 5.1 mmol/L Final  . Chloride 06/07/2014 95* 96 - 112 mmol/L Final  . BUN 06/07/2014 44* 6 - 23 mg/dL Final  . Creatinine, Ser 06/07/2014 1.80* 0.50 - 1.10 mg/dL Final  . Glucose, Bld 06/07/2014 116* 70 - 99 mg/dL Final  . Calcium, Ion 06/07/2014 1.03* 1.13 - 1.30 mmol/L Final  . TCO2 06/07/2014 24  0 - 100 mmol/L Final  . Hemoglobin 06/07/2014 14.3  12.0 - 15.0 g/dL Final  . HCT 06/07/2014 42.0  36.0 - 46.0 % Final  . Lipase 06/07/2014 19  11 - 59 U/L Final  . Color, Urine 06/07/2014 YELLOW  YELLOW Final  . APPearance 06/07/2014 CLEAR  CLEAR Final  . Specific Gravity, Urine 06/07/2014 1.007  1.005 - 1.030 Final  . pH 06/07/2014 5.5  5.0 - 8.0 Final  . Glucose, UA 06/07/2014 NEGATIVE  NEGATIVE mg/dL Final  . Hgb urine dipstick 06/07/2014 TRACE* NEGATIVE Final  . Bilirubin Urine 06/07/2014 NEGATIVE  NEGATIVE Final  . Ketones, ur 06/07/2014 NEGATIVE  NEGATIVE mg/dL Final  .  Protein, ur 06/07/2014 NEGATIVE  NEGATIVE mg/dL Final  . Urobilinogen, UA 06/07/2014 0.2  0.0 - 1.0 mg/dL Final  . Nitrite 06/07/2014 NEGATIVE  NEGATIVE Final  . Leukocytes, UA 06/07/2014 MODERATE* NEGATIVE Final  . Squamous Epithelial / LPF 06/07/2014 RARE  RARE Final  . WBC, UA 06/07/2014 3-6  <3 WBC/hpf Final  . RBC / HPF 06/07/2014 0-2  <3 RBC/hpf Final  . Bacteria, UA 06/07/2014 RARE  RARE Final  . Troponin i, poc 06/07/2014 0.03  0.00 - 0.08 ng/mL Final  . Comment 3 06/07/2014          Final   Comment: Due to the release kinetics of cTnI, a negative result within the first hours of the onset of symptoms does not rule out myocardial infarction with certainty. If myocardial infarction is still suspected, repeat the test at appropriate intervals.   . WBC 06/08/2014 8.0  4.0 - 10.5 K/uL Final  . RBC 06/08/2014 3.48* 3.87 - 5.11 MIL/uL Final  . Hemoglobin 06/08/2014 10.9* 12.0 - 15.0 g/dL Final  . HCT 06/08/2014 33.4* 36.0 - 46.0 % Final  . MCV 06/08/2014 96.0  78.0 - 100.0 fL Final  . MCH 06/08/2014 31.3  26.0 - 34.0 pg Final  . MCHC 06/08/2014 32.6  30.0 - 36.0 g/dL Final  . RDW 06/08/2014 14.3  11.5 - 15.5 % Final  . Platelets 06/08/2014 215  150 - 400 K/uL Final  . Neutrophils Relative % 06/08/2014 67  43 - 77 % Final  . Neutro Abs 06/08/2014 5.4  1.7 - 7.7 K/uL Final  . Lymphocytes Relative 06/08/2014 22  12 -  46 % Final  . Lymphs Abs 06/08/2014 1.8  0.7 - 4.0 K/uL Final  . Monocytes Relative 06/08/2014 11  3 - 12 % Final  . Monocytes Absolute 06/08/2014 0.8  0.1 - 1.0 K/uL Final  . Eosinophils Relative 06/08/2014 0  0 - 5 % Final  . Eosinophils Absolute 06/08/2014 0.0  0.0 - 0.7 K/uL Final  . Basophils Relative 06/08/2014 0  0 - 1 % Final  . Basophils Absolute 06/08/2014 0.0  0.0 - 0.1 K/uL Final  . WBC 06/08/2014 8.8  4.0 - 10.5 K/uL Final  . RBC 06/08/2014 3.59* 3.87 - 5.11 MIL/uL Final  . Hemoglobin 06/08/2014 11.4* 12.0 - 15.0 g/dL Final   Comment: DELTA CHECK  NOTED REPEATED TO VERIFY PREVIOUS RESULT ISTAT   . HCT 06/08/2014 34.4* 36.0 - 46.0 % Final  . MCV 06/08/2014 95.8  78.0 - 100.0 fL Final  . MCH 06/08/2014 31.8  26.0 - 34.0 pg Final  . MCHC 06/08/2014 33.1  30.0 - 36.0 g/dL Final  . RDW 06/08/2014 14.4  11.5 - 15.5 % Final  . Platelets 06/08/2014 217  150 - 400 K/uL Final  . Sodium 06/08/2014 135  135 - 145 mmol/L Final   Comment: DELTA CHECK NOTED REPEATED TO VERIFY   . Potassium 06/08/2014 4.0  3.5 - 5.1 mmol/L Final  . Chloride 06/08/2014 100  96 - 112 mmol/L Final  . CO2 06/08/2014 25  19 - 32 mmol/L Final  . Glucose, Bld 06/08/2014 89  70 - 99 mg/dL Final  . BUN 06/08/2014 29* 6 - 23 mg/dL Final   Comment: DELTA CHECK NOTED REPEATED TO VERIFY   . Creatinine, Ser 06/08/2014 1.00  0.50 - 1.10 mg/dL Final   Comment: DELTA CHECK NOTED REPEATED TO VERIFY   . Calcium 06/08/2014 8.6  8.4 - 10.5 mg/dL Final  . Total Protein 06/08/2014 6.2  6.0 - 8.3 g/dL Final  . Albumin 06/08/2014 3.2* 3.5 - 5.2 g/dL Final  . AST 06/08/2014 21  0 - 37 U/L Final  . ALT 06/08/2014 12  0 - 35 U/L Final  . Alkaline Phosphatase 06/08/2014 115  39 - 117 U/L Final  . Total Bilirubin 06/08/2014 0.6  0.3 - 1.2 mg/dL Final  . GFR calc non Af Amer 06/08/2014 47* >90 mL/min Final  . GFR calc Af Amer 06/08/2014 55* >90 mL/min Final   Comment: (NOTE) The eGFR has been calculated using the CKD EPI equation. This calculation has not been validated in all clinical situations. eGFR's persistently <90 mL/min signify possible Chronic Kidney Disease.   . Anion gap 06/08/2014 10  5 - 15 Final  . MRSA by PCR 06/07/2014 NEGATIVE  NEGATIVE Final   Comment:        The GeneXpert MRSA Assay (FDA approved for NASAL specimens only), is one component of a comprehensive MRSA colonization surveillance program. It is not intended to diagnose MRSA infection nor to guide or monitor treatment for MRSA infections.   . WBC 06/09/2014 7.8  4.0 - 10.5 K/uL Final  . RBC  06/09/2014 3.72* 3.87 - 5.11 MIL/uL Final  . Hemoglobin 06/09/2014 11.7* 12.0 - 15.0 g/dL Final  . HCT 06/09/2014 35.8* 36.0 - 46.0 % Final  . MCV 06/09/2014 96.2  78.0 - 100.0 fL Final  . MCH 06/09/2014 31.5  26.0 - 34.0 pg Final  . MCHC 06/09/2014 32.7  30.0 - 36.0 g/dL Final  . RDW 06/09/2014 14.6  11.5 - 15.5 % Final  . Platelets 06/09/2014  216  150 - 400 K/uL Final  . Sodium 06/09/2014 134* 135 - 145 mmol/L Final  . Potassium 06/09/2014 3.4* 3.5 - 5.1 mmol/L Final  . Chloride 06/09/2014 100  96 - 112 mmol/L Final  . CO2 06/09/2014 25  19 - 32 mmol/L Final  . Glucose, Bld 06/09/2014 93  70 - 99 mg/dL Final  . BUN 06/09/2014 21  6 - 23 mg/dL Final  . Creatinine, Ser 06/09/2014 0.91  0.50 - 1.10 mg/dL Final  . Calcium 06/09/2014 8.3* 8.4 - 10.5 mg/dL Final  . GFR calc non Af Amer 06/09/2014 53* >90 mL/min Final  . GFR calc Af Amer 06/09/2014 61* >90 mL/min Final   Comment: (NOTE) The eGFR has been calculated using the CKD EPI equation. This calculation has not been validated in all clinical situations. eGFR's persistently <90 mL/min signify possible Chronic Kidney Disease.   . Anion gap 06/09/2014 9  5 - 15 Final  . Sodium 06/10/2014 137  135 - 145 mmol/L Final  . Potassium 06/10/2014 4.3  3.5 - 5.1 mmol/L Final   Comment: DELTA CHECK NOTED REPEATED TO VERIFY NO VISIBLE HEMOLYSIS   . Chloride 06/10/2014 102  96 - 112 mmol/L Final  . CO2 06/10/2014 27  19 - 32 mmol/L Final  . Glucose, Bld 06/10/2014 118* 70 - 99 mg/dL Final  . BUN 06/10/2014 21  6 - 23 mg/dL Final  . Creatinine, Ser 06/10/2014 0.99  0.50 - 1.10 mg/dL Final  . Calcium 06/10/2014 8.8  8.4 - 10.5 mg/dL Final  . GFR calc non Af Amer 06/10/2014 48* >90 mL/min Final  . GFR calc Af Amer 06/10/2014 55* >90 mL/min Final   Comment: (NOTE) The eGFR has been calculated using the CKD EPI equation. This calculation has not been validated in all clinical situations. eGFR's persistently <90 mL/min signify possible Chronic  Kidney Disease.   . Anion gap 06/10/2014 8  5 - 15 Final  . WBC 06/10/2014 11.6* 4.0 - 10.5 K/uL Final  . RBC 06/10/2014 3.82* 3.87 - 5.11 MIL/uL Final  . Hemoglobin 06/10/2014 12.3  12.0 - 15.0 g/dL Final  . HCT 06/10/2014 36.9  36.0 - 46.0 % Final  . MCV 06/10/2014 96.6  78.0 - 100.0 fL Final  . MCH 06/10/2014 32.2  26.0 - 34.0 pg Final  . MCHC 06/10/2014 33.3  30.0 - 36.0 g/dL Final  . RDW 06/10/2014 14.9  11.5 - 15.5 % Final  . Platelets 06/10/2014 268  150 - 400 K/uL Final  Lab on 06/09/2014  Component Date Value Ref Range Status  . Hemoglobin 06/07/2014 12.9  12.0 - 16.0 g/dL Final  . HCT 06/07/2014 39  36 - 46 % Final  . Platelets 06/07/2014 249  150 - 399 K/L Final  . WBC 06/07/2014 11.7   Final  . Glucose 06/07/2014 101   Final  . BUN 06/07/2014 40* 4 - 21 mg/dL Final  . Creatinine 06/07/2014 1.6* 0.5 - 1.1 mg/dL Final  . Potassium 06/07/2014 5.1  3.4 - 5.3 mmol/L Final  . Sodium 06/07/2014 128* 137 - 147 mmol/L Final  . Alkaline Phosphatase 06/07/2014 134* 25 - 125 U/L Final  . ALT 06/07/2014 12  7 - 35 U/L Final  . AST 06/07/2014 25  13 - 35 U/L Final  . Bilirubin, Total 06/07/2014 0.6   Final  Admission on 05/31/2014, Discharged on 06/02/2014  Component Date Value Ref Range Status  . Sodium 05/31/2014 137  135 - 145 mmol/L Final  .  Potassium 05/31/2014 3.7  3.5 - 5.1 mmol/L Final  . Chloride 05/31/2014 102  96 - 112 mmol/L Final  . CO2 05/31/2014 27  19 - 32 mmol/L Final  . Glucose, Bld 05/31/2014 122* 70 - 99 mg/dL Final  . BUN 05/31/2014 27* 6 - 23 mg/dL Final  . Creatinine, Ser 05/31/2014 1.14* 0.50 - 1.10 mg/dL Final  . Calcium 05/31/2014 8.8  8.4 - 10.5 mg/dL Final  . Total Protein 05/31/2014 6.9  6.0 - 8.3 g/dL Final  . Albumin 05/31/2014 3.5  3.5 - 5.2 g/dL Final  . AST 05/31/2014 19  0 - 37 U/L Final  . ALT 05/31/2014 10  0 - 35 U/L Final  . Alkaline Phosphatase 05/31/2014 86  39 - 117 U/L Final  . Total Bilirubin 05/31/2014 0.9  0.3 - 1.2 mg/dL Final    . GFR calc non Af Amer 05/31/2014 40* >90 mL/min Final  . GFR calc Af Amer 05/31/2014 47* >90 mL/min Final   Comment: (NOTE) The eGFR has been calculated using the CKD EPI equation. This calculation has not been validated in all clinical situations. eGFR's persistently <90 mL/min signify possible Chronic Kidney Disease.   . Anion gap 05/31/2014 8  5 - 15 Final  . WBC 05/31/2014 9.1  4.0 - 10.5 K/uL Final  . RBC 05/31/2014 3.57* 3.87 - 5.11 MIL/uL Final  . Hemoglobin 05/31/2014 11.3* 12.0 - 15.0 g/dL Final  . HCT 05/31/2014 34.5* 36.0 - 46.0 % Final  . MCV 05/31/2014 96.6  78.0 - 100.0 fL Final  . MCH 05/31/2014 31.7  26.0 - 34.0 pg Final  . MCHC 05/31/2014 32.8  30.0 - 36.0 g/dL Final  . RDW 05/31/2014 14.3  11.5 - 15.5 % Final  . Platelets 05/31/2014 230  150 - 400 K/uL Final  . Neutrophils Relative % 05/31/2014 72  43 - 77 % Final  . Neutro Abs 05/31/2014 6.5  1.7 - 7.7 K/uL Final  . Lymphocytes Relative 05/31/2014 18  12 - 46 % Final  . Lymphs Abs 05/31/2014 1.6  0.7 - 4.0 K/uL Final  . Monocytes Relative 05/31/2014 10  3 - 12 % Final  . Monocytes Absolute 05/31/2014 0.9  0.1 - 1.0 K/uL Final  . Eosinophils Relative 05/31/2014 0  0 - 5 % Final  . Eosinophils Absolute 05/31/2014 0.0  0.0 - 0.7 K/uL Final  . Basophils Relative 05/31/2014 0  0 - 1 % Final  . Basophils Absolute 05/31/2014 0.0  0.0 - 0.1 K/uL Final  . Color, Urine 05/31/2014 YELLOW  YELLOW Final  . APPearance 05/31/2014 CLEAR  CLEAR Final  . Specific Gravity, Urine 05/31/2014 1.013  1.005 - 1.030 Final  . pH 05/31/2014 7.0  5.0 - 8.0 Final  . Glucose, UA 05/31/2014 NEGATIVE  NEGATIVE mg/dL Final  . Hgb urine dipstick 05/31/2014 NEGATIVE  NEGATIVE Final  . Bilirubin Urine 05/31/2014 NEGATIVE  NEGATIVE Final  . Ketones, ur 05/31/2014 NEGATIVE  NEGATIVE mg/dL Final  . Protein, ur 05/31/2014 30* NEGATIVE mg/dL Final  . Urobilinogen, UA 05/31/2014 0.2  0.0 - 1.0 mg/dL Final  . Nitrite 05/31/2014 NEGATIVE  NEGATIVE  Final  . Leukocytes, UA 05/31/2014 NEGATIVE  NEGATIVE Final  . Lipase 05/31/2014 17  11 - 59 U/L Final  . Squamous Epithelial / LPF 05/31/2014 RARE  RARE Final  . WBC, UA 05/31/2014 0-2  <3 WBC/hpf Final  . Troponin I 05/31/2014 0.03  <0.031 ng/mL Final   Comment:        NO  INDICATION OF MYOCARDIAL INJURY.   . Sodium 06/01/2014 143  135 - 145 mmol/L Final  . Potassium 06/01/2014 4.0  3.5 - 5.1 mmol/L Final  . Chloride 06/01/2014 101  96 - 112 mmol/L Final  . CO2 06/01/2014 30  19 - 32 mmol/L Final  . Glucose, Bld 06/01/2014 101* 70 - 99 mg/dL Final  . BUN 06/01/2014 31* 6 - 23 mg/dL Final  . Creatinine, Ser 06/01/2014 1.35* 0.50 - 1.10 mg/dL Final  . Calcium 06/01/2014 9.1  8.4 - 10.5 mg/dL Final  . Total Protein 06/01/2014 6.2  6.0 - 8.3 g/dL Final  . Albumin 06/01/2014 3.2* 3.5 - 5.2 g/dL Final  . AST 06/01/2014 18  0 - 37 U/L Final  . ALT 06/01/2014 9  0 - 35 U/L Final  . Alkaline Phosphatase 06/01/2014 78  39 - 117 U/L Final  . Total Bilirubin 06/01/2014 0.9  0.3 - 1.2 mg/dL Final  . GFR calc non Af Amer 06/01/2014 33* >90 mL/min Final  . GFR calc Af Amer 06/01/2014 38* >90 mL/min Final   Comment: (NOTE) The eGFR has been calculated using the CKD EPI equation. This calculation has not been validated in all clinical situations. eGFR's persistently <90 mL/min signify possible Chronic Kidney Disease.   . Anion gap 06/01/2014 12  5 - 15 Final  . WBC 06/01/2014 7.5  4.0 - 10.5 K/uL Final  . RBC 06/01/2014 3.39* 3.87 - 5.11 MIL/uL Final  . Hemoglobin 06/01/2014 10.9* 12.0 - 15.0 g/dL Final  . HCT 06/01/2014 33.1* 36.0 - 46.0 % Final  . MCV 06/01/2014 97.6  78.0 - 100.0 fL Final  . MCH 06/01/2014 32.2  26.0 - 34.0 pg Final  . MCHC 06/01/2014 32.9  30.0 - 36.0 g/dL Final  . RDW 06/01/2014 14.8  11.5 - 15.5 % Final  . Platelets 06/01/2014 241  150 - 400 K/uL Final  . Neutrophils Relative % 06/01/2014 66  43 - 77 % Final  . Neutro Abs 06/01/2014 5.0  1.7 - 7.7 K/uL Final  .  Lymphocytes Relative 06/01/2014 24  12 - 46 % Final  . Lymphs Abs 06/01/2014 1.8  0.7 - 4.0 K/uL Final  . Monocytes Relative 06/01/2014 10  3 - 12 % Final  . Monocytes Absolute 06/01/2014 0.8  0.1 - 1.0 K/uL Final  . Eosinophils Relative 06/01/2014 0  0 - 5 % Final  . Eosinophils Absolute 06/01/2014 0.0  0.0 - 0.7 K/uL Final  . Basophils Relative 06/01/2014 0  0 - 1 % Final  . Basophils Absolute 06/01/2014 0.0  0.0 - 0.1 K/uL Final  . TSH 05/31/2014 1.457  0.350 - 4.500 uIU/mL Final   Performed at [facility]  . WBC 05/31/2014 8.1  4.0 - 10.5 K/uL Final  . RBC 05/31/2014 3.31* 3.87 - 5.11 MIL/uL Final  . Hemoglobin 05/31/2014 10.7* 12.0 - 15.0 g/dL Final  . HCT 05/31/2014 32.0* 36.0 - 46.0 % Final  . MCV 05/31/2014 96.7  78.0 - 100.0 fL Final  . MCH 05/31/2014 32.3  26.0 - 34.0 pg Final  . MCHC 05/31/2014 33.4  30.0 - 36.0 g/dL Final  . RDW 05/31/2014 14.7  11.5 - 15.5 % Final  . Platelets 05/31/2014 227  150 - 400 K/uL Final  . Creatinine, Ser 05/31/2014 1.17* 0.50 - 1.10 mg/dL Final  . GFR calc non Af Amer 05/31/2014 39* >90 mL/min Final  . GFR calc Af Amer 05/31/2014 45* >90 mL/min Final   Comment: (NOTE) The eGFR  has been calculated using the CKD EPI equation. This calculation has not been validated in all clinical situations. eGFR's persistently <90 mL/min signify possible Chronic Kidney Disease.   . Lipase 06/01/2014 17  11 - 59 U/L Final    Ct Abdomen Pelvis Wo Contrast  06/07/2014   CLINICAL DATA:  Nursing home patient with acute abdominal pain in the right upper quadrant.  EXAM: CT ABDOMEN AND PELVIS WITHOUT CONTRAST  TECHNIQUE: Multidetector CT imaging of the abdomen and pelvis was performed following the standard protocol without IV contrast.  COMPARISON:  None available  FINDINGS: Lower chest: Trace pleural effusions dependently. There is associated in minimal bibasilar atelectasis. Calcified left lower lobe granuloma noted, image 6. No pneumothorax.  Atherosclerosis noted of the aorta and coronary vessels. Aneurysmal dilatation of the left ventricle noted. No hiatal hernia.  Abdomen: Limited exam with respiratory motion artifact and lack of IV and oral contrast.  2 cm hypodense probable cyst in the left hepatic lobe lateral segment, image 19. No biliary dilatation. Liver, gallbladder, biliary system, pancreas, spleen, and adrenal glands are within normal limits for age and noncontrast imaging.  The kidneys demonstrate an extrarenal pelvis on the right. No acute obstructive uropathy or hydronephrosis. No obstructing urinary tract 4 ureteral calculus appreciated. Right kidney demonstrates scattered small subcentimeter hyperdense lesions, suspect numerous hyperdense cysts.  Aorta is atherosclerotic and tortuous. Mild aneurysmal dilatation of the suprarenal aorta measuring 3.1 cm, image 20. No retroperitoneal hemorrhage.  Negative for bowel obstruction, dilatation, ileus, or free air.  No abdominal free fluid, fluid collection, hemorrhage, abscess, adenopathy.  Pelvis: Extensive iliac atherosclerosis. Diverticulosis noted of the sigmoid colon. No acute inflammatory process. Prior hysterectomy noted. No pelvic free fluid, fluid collection, hemorrhage, abscess, or adenopathy. Right inguinal hernia noted containing loops of small bowel without associated obstruction. Pelvic calcifications consistent with venous phleboliths.  Diffuse osteopenia noted. Degenerative changes noted of the hips, pelvis and spine diffusely. multiple thoracic and lumbar compression fractures.  IMPRESSION: No definite acute intra-abdominal or pelvic finding by noncontrast imaging. Limited exam with motion artifact.  Trace pleural effusions and bibasilar atelectasis  Tortuous atherosclerotic aneurysmal abdominal aorta, maximal diameter 3.1 cm.  No acute obstructive uropathy or hydronephrosis. No obstructing urinary tract calculus.  Sigmoid diverticulosis  Left inguinal hernia containing small  bowel but no evidence of incarceration or obstruction.   Electronically Signed   By: Jerilynn Mages.  Shick M.D.   On: 06/07/2014 18:22   Ct Abdomen Pelvis Wo Contrast  05/31/2014   CLINICAL DATA:  Right-sided back and abdominal pain, elevated blood pressure, history essential hypertension, pancreatitis, atrial fibrillation, acute on chronic renal failure  EXAM: CT ABDOMEN AND PELVIS WITHOUT CONTRAST  TECHNIQUE: Multidetector CT imaging of the abdomen and pelvis was performed following the standard protocol without IV contrast. Sagittal and coronal MPR images reconstructed from axial data set.  COMPARISON:  04/07/2012  FINDINGS: Calcified granuloma LEFT lower lobe.  Tiny nodule at base of RIGHT major fissure image 5.  Bibasilar atelectasis.  Extensive atherosclerotic disease of coronary arteries, abdominal aorta, branch vessels and iliac systems.  Aneurysmal dilatation of a tortuous abdominal aorta, 3.6 cm AP image 23, 3.2 cm transverse on coronal image 39.  Tiny hyperdense nodules in the RIGHT kidney again seen suspect hyperdense cysts.  Small RIGHT lobe hepatic cyst 6 mm diameter image 32 with additional lateral segment LEFT lobe lesion 17 x 11 mm image 22, likely an additional cyst.  Enlargement of pancreatic tail since previous exam with slightly indistinct margins raising question  of distal pancreatitis.  Liver, spleen, kidneys and adrenal glands otherwise unremarkable.  Low lying cecum and RIGHT pelvis.  Sigmoid diverticulosis without definite evidence of diverticulitis.  LEFT inguinal hernia containing a nonobstructed small bowel loop.  Stomach and bowel loops otherwise unremarkable.  Tiny amount of fat at anterior bladder image 64.  No additional mass, adenopathy, free air or free fluid.  Bones demineralized with extensive compression deformities of thoracolumbar vertebrae.  IMPRESSION: Enlargement and slight ill definition of the pancreatic tail suspicious for acute pancreatitis; recommend correlation with serum  amylase/lipase.  Hepatic and probable high attenuation RIGHT renal cysts.  Extensive atherosclerotic disease with mild aneurysmal dilatation of the mid abdominal aorta, maximum 3.6 x 3.2 cm.  LEFT inguinal hernia containing an unobstructed small bowel loop.  Osteoporosis with extensive compression deformities of the thoracolumbar spine.   Electronically Signed   By: Lavonia Dana M.D.   On: 05/31/2014 19:48   Dg Ribs Unilateral W/chest Right  05/31/2014   CLINICAL DATA:  Right upper back pain radiating into the mid and lower ribs in abdomen.  EXAM: RIGHT RIBS AND CHEST - 3+ VIEW  COMPARISON:  Single view of the chest 04/06/2012. PA and lateral chest 01/31/2011.  FINDINGS: The patient has a small left pleural effusion. There is no right pleural effusion. Bibasilar atelectasis is noted. Heart size is normal. Small focus of increased density along the right seventh rib likely represents overlapping shadows as it cannot be confirmed on other images. No rib fracture is identified.  IMPRESSION: Small left pleural effusion and basilar airspace disease, likely atelectasis.   Electronically Signed   By: Inge Rise M.D.   On: 05/31/2014 22:28   Dg Thoracic Spine 2 View  05/31/2014   CLINICAL DATA:  RIGHT upper back/thoracic pain traveling to anterior mid to lower ribs and abdomen, no injury  EXAM: THORACIC SPINE - 2 VIEW  COMPARISON:  Chest two views 01/31/2011  FINDINGS: Osseous demineralization.  Twelve pairs of ribs.  Multiple thoracic compression fractures again identified.  Greatest degrees of compression are present at T6 and T8, unchanged.  New mild superior endplate compression deformities of lower thoracic vertebra including T11 and T12 as well as L1 and L2.  Minimal biconvex scoliosis.  Extensive atherosclerotic calcification aorta.  Visualized posterior ribs intact.  IMPRESSION: Multiple thoracic compression fractures, with relatively stable marked compression deformities at T6 and T8 as well as  age-indeterminate but new superior endplate height losses at T11-L2.   Electronically Signed   By: Lavonia Dana M.D.   On: 05/31/2014 22:30   Mr Thoracic Spine Wo Contrast  06/01/2014   CLINICAL DATA:  Back pain.  Compression fracture.  EXAM: MRI THORACIC SPINE WITHOUT CONTRAST  TECHNIQUE: Multiplanar, multisequence MR imaging of the thoracic spine was performed. No intravenous contrast was administered.  COMPARISON:  CT thoracic spine 12/03/2010  FINDINGS: Numerous compression fractures are seen throughout the thoracic spine. These appear benign and related to osteoporosis. No evidence of metastatic disease.  Negative for cord compression. No cord edema or cord lesion identified.  Mild chronic compression fracture T2, T3, T4  Severe chronic compression fracture T6  Mild compression fracture T7 with diffuse bone marrow edema compatible with acute or subacute fracture. Small left-sided osteophyte on the left at C6-7 without significant spinal stenosis.  Severe chronic compression fracture T8.  Mild spondylosis at T8-9  Mild compression fracture T9 with bone marrow edema compatible with acute or subacute fracture.  Mild chronic fracture T10  Moderate compression fracture  T11 with mild superior endplate edema. This is likely a late subacute fracture.  Moderately severe chronic compression fracture T12 with mild retropulsion of bone into the canal. No significant spinal stenosis  Moderately severe chronic compression fracture L1  Moderate to severe fracture of L2 with mild bone marrow edema. This is most likely a late subacute fracture  Moderate chronic compression fracture L3.  IMPRESSION: Numerous compression fractures throughout the thoracic spine which appear benign.  Mild compression fracture T7 which appears acute or subacute.  Mild compression fracture T9 which appears acute or subacute  Compression fractures at T11 and L2 contain a mild amount of edema and may be late subacute fractures  No cord compression.    Electronically Signed   By: Franchot Gallo M.D.   On: 06/01/2014 08:27   US Abdomen Limited Ruq  06/07/2014   CLINICAL DATA:  Right upper quadrant pain for 1 day.  EXAM: US ABDOMEN LIMITED - RIGHT UPPER QUADRANT  COMPARISON:  CT earlier the same day.  FINDINGS: Gallbladder:  No gallstones or wall thickening visualized. No sonographic Murphy sign noted.  Common bile duct:  Diameter: 5 mm.  Liver:  Within the left hepatic lobe is a 1.1 x 1.3 x 1.1 cm cyst. Question punctate hepatic granuloma, image number 12 and 16. Within normal limits in parenchymal echogenicity.  Technologist notes patient had tenderness in the right upper quadrant pole scanning, however no frank sonographic Murphy sign.  IMPRESSION: 1. Normal sonographic appearance of the gallbladder. No biliary dilatation. 2. Cyst in the left hepatic lobe.   Electronically Signed   By: Jeb Levering M.D.   On: 06/07/2014 21:05     Assessment/Plan  1. Chronic atrial fibrillation rate controlled  2. Hyponatremia Back to normal 140 mEq per liter on 06/15/2014. Discontinue demeclocycline.  3. Renal insufficiency BUN 42, creatinine 1.7  4. Protein-calorie malnutrition, severe Appetite is improving  5. Hypokalemia Resolved  6. Hyperkalemia Potassium 5.5 mEq per liter. Supplements are stopped  7. CKD (chronic kidney disease) stage 3, GFR 30-59 ml/min Chronic and unchanged  8. Compression fracture of L2 lumbar vertebra with routine healing Much improvement of the pain  9. Essential hypertension Controlled

## 2014-06-20 ENCOUNTER — Other Ambulatory Visit: Payer: Self-pay | Admitting: Nurse Practitioner

## 2014-06-20 DIAGNOSIS — N183 Chronic kidney disease, stage 3 unspecified: Secondary | ICD-10-CM

## 2014-06-20 DIAGNOSIS — E876 Hypokalemia: Secondary | ICD-10-CM

## 2014-06-20 DIAGNOSIS — E871 Hypo-osmolality and hyponatremia: Secondary | ICD-10-CM

## 2014-06-20 DIAGNOSIS — I480 Paroxysmal atrial fibrillation: Secondary | ICD-10-CM

## 2014-06-20 LAB — BASIC METABOLIC PANEL
BUN: 41 mg/dL — AB (ref 4–21)
CREATININE: 1.5 mg/dL — AB (ref 0.5–1.1)
GLUCOSE: 90 mg/dL
POTASSIUM: 4.7 mmol/L (ref 3.4–5.3)
SODIUM: 137 mmol/L (ref 137–147)

## 2014-06-30 ENCOUNTER — Encounter: Payer: Self-pay | Admitting: Internal Medicine

## 2014-07-06 ENCOUNTER — Encounter: Payer: Self-pay | Admitting: Internal Medicine

## 2014-07-14 ENCOUNTER — Encounter: Payer: Self-pay | Admitting: Internal Medicine

## 2014-07-14 ENCOUNTER — Non-Acute Institutional Stay: Payer: Medicare Other | Admitting: Internal Medicine

## 2014-07-14 VITALS — BP 164/72 | HR 72 | Temp 98.3°F | Wt 102.0 lb

## 2014-07-14 DIAGNOSIS — S32020D Wedge compression fracture of second lumbar vertebra, subsequent encounter for fracture with routine healing: Secondary | ICD-10-CM

## 2014-07-14 DIAGNOSIS — E871 Hypo-osmolality and hyponatremia: Secondary | ICD-10-CM

## 2014-07-14 DIAGNOSIS — R110 Nausea: Secondary | ICD-10-CM | POA: Diagnosis not present

## 2014-07-14 DIAGNOSIS — K219 Gastro-esophageal reflux disease without esophagitis: Secondary | ICD-10-CM

## 2014-07-14 DIAGNOSIS — Z8719 Personal history of other diseases of the digestive system: Secondary | ICD-10-CM

## 2014-07-14 NOTE — Progress Notes (Signed)
Patient ID: [name], female   DOB: [birth year], 81 y.o.   MRN: [id number]    [facility] Room Number: [id number]  HALPF of Service: Clinic (12)    Allergies  Allergen Reactions  . Codeine Other (See Comments)    Unknown reaction. Info from a MAR.  Marland Kitchen Codeine     Unknown.    Chief Complaint  Patient presents with  . Medical Management of Chronic Issues    follow-up from [facility] discharge for abdominal pain. c/o of nausea for several days    HPI:  Nausea without vomiting: denies vomiting, reflux, dysuria, abd pain, fever. Hx of pancreatitis in the past.  Hx of pancreatitis: prior history  Hyponatremia: prior history  Compression fracture of L2 lumbar vertebra with routine healing: back is pain free. She is up walking with a walker independently.  Gastroesophageal reflux disease without esophagitis: denies heartburn.    Medications: Patient's Medications  New Prescriptions   No medications on file  Previous Medications   ACETAMINOPHEN (TYLENOL) 325 MG TABLET    Take 650 mg by mouth every 4 (four) hours as needed for mild pain or fever.   BRIMONIDINE (ALPHAGAN) 0.2 % OPHTHALMIC SOLUTION    Place 1 drop into both eyes 2 (two) times daily.   CHOLECALCIFEROL (VITAMIN D) 2000 UNITS TABLET    Take 2,000 Units by mouth every morning.   CLONIDINE (CATAPRES) 0.1 MG TABLET    Take 0.1 mg by mouth daily.   DIGOXIN (LANOXIN) 0.125 MG TABLET    Take 0.125 mg by mouth daily. Take 1/2 tablet every morning * check pulse daily*   FENTANYL (DURAGESIC) 25 MCG/HR PATCH    Place 1 patch (25 mcg total) onto the skin every 3 (three) days.   HYDRALAZINE (APRESOLINE) 50 MG TABLET    Take 50 mg by mouth 2 (two) times daily.   HYDROCODONE-ACETAMINOPHEN (NORCO) 7.5-325 MG PER TABLET    Take 1 tablet by mouth every 6 (six) hours as needed for moderate pain.   LORAZEPAM (ATIVAN) 0.5 MG TABLET    Take 0.5 mg by mouth. Take one tablet daily as needed for anxiety   LORAZEPAM (ATIVAN) 0.5 MG TABLET    Take 1 tablet (0.5 mg total) by mouth every morning.   MAGNESIUM HYDROXIDE (MILK OF MAGNESIA) 400 MG/5ML SUSPENSION    Take 30 mLs by mouth daily as needed for mild constipation.   METOPROLOL TARTRATE (LOPRESSOR) 25 MG TABLET    Take 3 tablets (75 mg total) by mouth 2 (two) times daily.   MIRTAZAPINE (REMERON) 7.5 MG TABLET    Take 7.5 mg by mouth at bedtime.   OMEPRAZOLE (PRILOSEC) 20 MG CAPSULE    Take 1 capsule by mouth every morning.   ONDANSETRON (ZOFRAN) 4 MG TABLET    Take 4 mg by mouth every 8 (eight) hours as needed for nausea or vomiting.   POLYETHYLENE GLYCOL (MIRALAX / GLYCOLAX) PACKET    Take 17 g by mouth daily as needed for moderate constipation.   VITAMIN B-12 (CYANOCOBALAMIN) 500 MCG TABLET    Take 500 mcg by mouth every morning.  Modified Medications   No medications on file  Discontinued Medications   CYANOCOBALAMIN 500 MCG TABLET    Take 500 mcg by mouth daily.   POLYETHYLENE GLYCOL (MIRALAX) PACKET    Take 17 g by mouth daily.     Review of Systems  Constitutional: Positive for fatigue and unexpected weight change. Negative for fever, chills and  diaphoresis.  HENT: Positive for hearing loss. Negative for congestion, ear discharge, ear pain, nosebleeds, sore throat and tinnitus.   Eyes: Negative for photophobia, pain, discharge and redness.  Respiratory: Negative for cough, shortness of breath, wheezing and stridor.   Cardiovascular: Negative for chest pain, palpitations and leg swelling.  Gastrointestinal: Negative for nausea, vomiting, abdominal pain, diarrhea, constipation and blood in stool.  Endocrine: Negative for polydipsia.  Genitourinary: Positive for frequency. Negative for dysuria, urgency, hematuria and flank pain.  Musculoskeletal: Positive for back pain and gait problem. Negative for myalgias and neck pain.       Much improved  Skin: Negative for rash.  Allergic/Immunologic: Negative for environmental allergies.    Neurological: Positive for weakness. Negative for dizziness, tremors, seizures and headaches.  Hematological: Does not bruise/bleed easily.  Psychiatric/Behavioral: Negative for suicidal ideas and hallucinations. The patient is nervous/anxious.     Filed Vitals:   07/14/14 1450  BP: 164/72  Pulse: 72  Temp: 98.3 F (36.8 C)  TempSrc: Oral  Weight: 102 lb (46.267 kg)  SpO2: 95%   Body mass index is 16.97 kg/(m^2).  Physical Exam  Constitutional: She is oriented to person, place, and time. She appears well-developed and well-nourished. No distress.  HENT:  Head: Normocephalic and atraumatic.  Mouth/Throat: No oropharyngeal exudate.  Eyes: Conjunctivae and EOM are normal. Pupils are equal, round, and reactive to light. Right eye exhibits no discharge. Left eye exhibits no discharge.  Neck: Normal range of motion. Neck supple. No JVD present. No thyromegaly present.  Cardiovascular: Normal rate, regular rhythm and normal heart sounds.   No murmur heard. Pulmonary/Chest: Effort normal and breath sounds normal. No respiratory distress. She has no wheezes. She has no rales.  Abdominal: Soft. Bowel sounds are normal. She exhibits no distension. There is no tenderness.  Right inguinal hernia  Musculoskeletal: She exhibits tenderness (lower back). She exhibits no edema.  Back pain is managed with lumbar brace  Lymphadenopathy:    She has no cervical adenopathy.  Neurological: She is alert and oriented to person, place, and time. She has normal reflexes. No cranial nerve deficit. She exhibits normal muscle tone. Coordination normal.  Skin: Skin is warm and dry. No rash noted. She is not diaphoretic. No erythema.  Psychiatric: She has a normal mood and affect. Her behavior is normal. Thought content normal. Her mood appears not anxious. Her affect is not angry, not blunt, not labile and not inappropriate. Her speech is not rapid and/or pressured, not delayed, not tangential and not slurred.  She is not agitated, not aggressive, not hyperactive, not withdrawn, not actively hallucinating and not combative. Thought content is not paranoid and not delusional. Cognition and memory are impaired. She does not exhibit a depressed mood. She is communicative. She exhibits abnormal recent memory.     Labs reviewed: Lab on 06/20/2014  Component Date Value Ref Range Status  . Glucose 06/20/2014 90   Final  . BUN 06/20/2014 41* 4 - 21 mg/dL Final  . Creatinine 06/20/2014 1.5* 0.5 - 1.1 mg/dL Final  . Potassium 06/20/2014 4.7  3.4 - 5.3 mmol/L Final  . Sodium 06/20/2014 137  137 - 147 mmol/L Final  Nursing Home on 06/15/2014  Component Date Value Ref Range Status  . Glucose 06/15/2014 124   Final  . BUN 06/15/2014 42* 4 - 21 mg/dL Final  . Creatinine 06/15/2014 1.7* 0.5 - 1.1 mg/dL Final  . Potassium 06/15/2014 5.5* 3.4 - 5.3 mmol/L Final  . Sodium 06/15/2014 140  137 - 147 mmol/L Final  Lab on 06/13/2014  Component Date Value Ref Range Status  . Glucose 06/13/2014 93   Final  . BUN 06/13/2014 36* 4 - 21 mg/dL Final  . Creatinine 06/13/2014 1.3* 0.5 - 1.1 mg/dL Final  . Potassium 06/13/2014 4.3  3.4 - 5.3 mmol/L Final  . Sodium 06/13/2014 140  137 - 147 mmol/L Final  . Alkaline Phosphatase 06/13/2014 138* 25 - 125 U/L Final  . ALT 06/13/2014 10  7 - 35 U/L Final  . AST 06/13/2014 17  13 - 35 U/L Final  . Bilirubin, Total 06/13/2014 0.5   Final  Admission on 06/07/2014, Discharged on 06/10/2014  Component Date Value Ref Range Status  . Sodium 06/07/2014 131* 135 - 145 mmol/L Final  . Potassium 06/07/2014 4.7  3.5 - 5.1 mmol/L Final  . Chloride 06/07/2014 94* 96 - 112 mmol/L Final  . CO2 06/07/2014 25  19 - 32 mmol/L Final  . Glucose, Bld 06/07/2014 113* 70 - 99 mg/dL Final  . BUN 06/07/2014 42* 6 - 23 mg/dL Final  . Creatinine, Ser 06/07/2014 1.54* 0.50 - 1.10 mg/dL Final  . Calcium 06/07/2014 9.2  8.4 - 10.5 mg/dL Final  . Total Protein 06/07/2014 6.9  6.0 - 8.3 g/dL Final  .  Albumin 06/07/2014 3.6  3.5 - 5.2 g/dL Final  . AST 06/07/2014 24  0 - 37 U/L Final  . ALT 06/07/2014 15  0 - 35 U/L Final  . Alkaline Phosphatase 06/07/2014 134* 39 - 117 U/L Final  . Total Bilirubin 06/07/2014 0.7  0.3 - 1.2 mg/dL Final  . GFR calc non Af Amer 06/07/2014 28* >90 mL/min Final  . GFR calc Af Amer 06/07/2014 32* >90 mL/min Final   Comment: (NOTE) The eGFR has been calculated using the CKD EPI equation. This calculation has not been validated in all clinical situations. eGFR's persistently <90 mL/min signify possible Chronic Kidney Disease.   . Anion gap 06/07/2014 12  5 - 15 Final  . WBC 06/07/2014 10.9* 4.0 - 10.5 K/uL Final  . RBC 06/07/2014 4.00  3.87 - 5.11 MIL/uL Final  . Hemoglobin 06/07/2014 12.7  12.0 - 15.0 g/dL Final  . HCT 06/07/2014 38.1  36.0 - 46.0 % Final  . MCV 06/07/2014 95.3  78.0 - 100.0 fL Final  . MCH 06/07/2014 31.8  26.0 - 34.0 pg Final  . MCHC 06/07/2014 33.3  30.0 - 36.0 g/dL Final  . RDW 06/07/2014 14.1  11.5 - 15.5 % Final  . Platelets 06/07/2014 240  150 - 400 K/uL Final  . Neutrophils Relative % 06/07/2014 76  43 - 77 % Final  . Neutro Abs 06/07/2014 8.2* 1.7 - 7.7 K/uL Final  . Lymphocytes Relative 06/07/2014 16  12 - 46 % Final  . Lymphs Abs 06/07/2014 1.8  0.7 - 4.0 K/uL Final  . Monocytes Relative 06/07/2014 8  3 - 12 % Final  . Monocytes Absolute 06/07/2014 0.9  0.1 - 1.0 K/uL Final  . Eosinophils Relative 06/07/2014 0  0 - 5 % Final  . Eosinophils Absolute 06/07/2014 0.0  0.0 - 0.7 K/uL Final  . Basophils Relative 06/07/2014 0  0 - 1 % Final  . Basophils Absolute 06/07/2014 0.0  0.0 - 0.1 K/uL Final  . Lactic Acid, Venous 06/07/2014 1.64  0.5 - 2.0 mmol/L Final  . Sodium 06/07/2014 128* 135 - 145 mmol/L Final  . Potassium 06/07/2014 4.7  3.5 - 5.1 mmol/L Final  . Chloride 06/07/2014  95* 96 - 112 mmol/L Final  . BUN 06/07/2014 44* 6 - 23 mg/dL Final  . Creatinine, Ser 06/07/2014 1.80* 0.50 - 1.10 mg/dL Final  . Glucose, Bld  06/07/2014 116* 70 - 99 mg/dL Final  . Calcium, Ion 06/07/2014 1.03* 1.13 - 1.30 mmol/L Final  . TCO2 06/07/2014 24  0 - 100 mmol/L Final  . Hemoglobin 06/07/2014 14.3  12.0 - 15.0 g/dL Final  . HCT 06/07/2014 42.0  36.0 - 46.0 % Final  . Lipase 06/07/2014 19  11 - 59 U/L Final  . Color, Urine 06/07/2014 YELLOW  YELLOW Final  . APPearance 06/07/2014 CLEAR  CLEAR Final  . Specific Gravity, Urine 06/07/2014 1.007  1.005 - 1.030 Final  . pH 06/07/2014 5.5  5.0 - 8.0 Final  . Glucose, UA 06/07/2014 NEGATIVE  NEGATIVE mg/dL Final  . Hgb urine dipstick 06/07/2014 TRACE* NEGATIVE Final  . Bilirubin Urine 06/07/2014 NEGATIVE  NEGATIVE Final  . Ketones, ur 06/07/2014 NEGATIVE  NEGATIVE mg/dL Final  . Protein, ur 06/07/2014 NEGATIVE  NEGATIVE mg/dL Final  . Urobilinogen, UA 06/07/2014 0.2  0.0 - 1.0 mg/dL Final  . Nitrite 06/07/2014 NEGATIVE  NEGATIVE Final  . Leukocytes, UA 06/07/2014 MODERATE* NEGATIVE Final  . Squamous Epithelial / LPF 06/07/2014 RARE  RARE Final  . WBC, UA 06/07/2014 3-6  <3 WBC/hpf Final  . RBC / HPF 06/07/2014 0-2  <3 RBC/hpf Final  . Bacteria, UA 06/07/2014 RARE  RARE Final  . Troponin i, poc 06/07/2014 0.03  0.00 - 0.08 ng/mL Final  . Comment 3 06/07/2014          Final   Comment: Due to the release kinetics of cTnI, a negative result within the first hours of the onset of symptoms does not rule out myocardial infarction with certainty. If myocardial infarction is still suspected, repeat the test at appropriate intervals.   . WBC 06/08/2014 8.0  4.0 - 10.5 K/uL Final  . RBC 06/08/2014 3.48* 3.87 - 5.11 MIL/uL Final  . Hemoglobin 06/08/2014 10.9* 12.0 - 15.0 g/dL Final  . HCT 06/08/2014 33.4* 36.0 - 46.0 % Final  . MCV 06/08/2014 96.0  78.0 - 100.0 fL Final  . MCH 06/08/2014 31.3  26.0 - 34.0 pg Final  . MCHC 06/08/2014 32.6  30.0 - 36.0 g/dL Final  . RDW 06/08/2014 14.3  11.5 - 15.5 % Final  . Platelets 06/08/2014 215  150 - 400 K/uL Final  . Neutrophils  Relative % 06/08/2014 67  43 - 77 % Final  . Neutro Abs 06/08/2014 5.4  1.7 - 7.7 K/uL Final  . Lymphocytes Relative 06/08/2014 22  12 - 46 % Final  . Lymphs Abs 06/08/2014 1.8  0.7 - 4.0 K/uL Final  . Monocytes Relative 06/08/2014 11  3 - 12 % Final  . Monocytes Absolute 06/08/2014 0.8  0.1 - 1.0 K/uL Final  . Eosinophils Relative 06/08/2014 0  0 - 5 % Final  . Eosinophils Absolute 06/08/2014 0.0  0.0 - 0.7 K/uL Final  . Basophils Relative 06/08/2014 0  0 - 1 % Final  . Basophils Absolute 06/08/2014 0.0  0.0 - 0.1 K/uL Final  . WBC 06/08/2014 8.8  4.0 - 10.5 K/uL Final  . RBC 06/08/2014 3.59* 3.87 - 5.11 MIL/uL Final  . Hemoglobin 06/08/2014 11.4* 12.0 - 15.0 g/dL Final   Comment: DELTA CHECK NOTED REPEATED TO VERIFY PREVIOUS RESULT ISTAT   . HCT 06/08/2014 34.4* 36.0 - 46.0 % Final  . MCV 06/08/2014 95.8  78.0 -  100.0 fL Final  . MCH 06/08/2014 31.8  26.0 - 34.0 pg Final  . MCHC 06/08/2014 33.1  30.0 - 36.0 g/dL Final  . RDW 06/08/2014 14.4  11.5 - 15.5 % Final  . Platelets 06/08/2014 217  150 - 400 K/uL Final  . Sodium 06/08/2014 135  135 - 145 mmol/L Final   Comment: DELTA CHECK NOTED REPEATED TO VERIFY   . Potassium 06/08/2014 4.0  3.5 - 5.1 mmol/L Final  . Chloride 06/08/2014 100  96 - 112 mmol/L Final  . CO2 06/08/2014 25  19 - 32 mmol/L Final  . Glucose, Bld 06/08/2014 89  70 - 99 mg/dL Final  . BUN 06/08/2014 29* 6 - 23 mg/dL Final   Comment: DELTA CHECK NOTED REPEATED TO VERIFY   . Creatinine, Ser 06/08/2014 1.00  0.50 - 1.10 mg/dL Final   Comment: DELTA CHECK NOTED REPEATED TO VERIFY   . Calcium 06/08/2014 8.6  8.4 - 10.5 mg/dL Final  . Total Protein 06/08/2014 6.2  6.0 - 8.3 g/dL Final  . Albumin 06/08/2014 3.2* 3.5 - 5.2 g/dL Final  . AST 06/08/2014 21  0 - 37 U/L Final  . ALT 06/08/2014 12  0 - 35 U/L Final  . Alkaline Phosphatase 06/08/2014 115  39 - 117 U/L Final  . Total Bilirubin 06/08/2014 0.6  0.3 - 1.2 mg/dL Final  . GFR calc non Af Amer 06/08/2014  47* >90 mL/min Final  . GFR calc Af Amer 06/08/2014 55* >90 mL/min Final   Comment: (NOTE) The eGFR has been calculated using the CKD EPI equation. This calculation has not been validated in all clinical situations. eGFR's persistently <90 mL/min signify possible Chronic Kidney Disease.   . Anion gap 06/08/2014 10  5 - 15 Final  . MRSA by PCR 06/07/2014 NEGATIVE  NEGATIVE Final   Comment:        The GeneXpert MRSA Assay (FDA approved for NASAL specimens only), is one component of a comprehensive MRSA colonization surveillance program. It is not intended to diagnose MRSA infection nor to guide or monitor treatment for MRSA infections.   . WBC 06/09/2014 7.8  4.0 - 10.5 K/uL Final  . RBC 06/09/2014 3.72* 3.87 - 5.11 MIL/uL Final  . Hemoglobin 06/09/2014 11.7* 12.0 - 15.0 g/dL Final  . HCT 06/09/2014 35.8* 36.0 - 46.0 % Final  . MCV 06/09/2014 96.2  78.0 - 100.0 fL Final  . MCH 06/09/2014 31.5  26.0 - 34.0 pg Final  . MCHC 06/09/2014 32.7  30.0 - 36.0 g/dL Final  . RDW 06/09/2014 14.6  11.5 - 15.5 % Final  . Platelets 06/09/2014 216  150 - 400 K/uL Final  . Sodium 06/09/2014 134* 135 - 145 mmol/L Final  . Potassium 06/09/2014 3.4* 3.5 - 5.1 mmol/L Final  . Chloride 06/09/2014 100  96 - 112 mmol/L Final  . CO2 06/09/2014 25  19 - 32 mmol/L Final  . Glucose, Bld 06/09/2014 93  70 - 99 mg/dL Final  . BUN 06/09/2014 21  6 - 23 mg/dL Final  . Creatinine, Ser 06/09/2014 0.91  0.50 - 1.10 mg/dL Final  . Calcium 06/09/2014 8.3* 8.4 - 10.5 mg/dL Final  . GFR calc non Af Amer 06/09/2014 53* >90 mL/min Final  . GFR calc Af Amer 06/09/2014 61* >90 mL/min Final   Comment: (NOTE) The eGFR has been calculated using the CKD EPI equation. This calculation has not been validated in all clinical situations. eGFR's persistently <90 mL/min signify possible Chronic Kidney Disease.   Marland Kitchen  Anion gap 06/09/2014 9  5 - 15 Final  . Sodium 06/10/2014 137  135 - 145 mmol/L Final  . Potassium 06/10/2014  4.3  3.5 - 5.1 mmol/L Final   Comment: DELTA CHECK NOTED REPEATED TO VERIFY NO VISIBLE HEMOLYSIS   . Chloride 06/10/2014 102  96 - 112 mmol/L Final  . CO2 06/10/2014 27  19 - 32 mmol/L Final  . Glucose, Bld 06/10/2014 118* 70 - 99 mg/dL Final  . BUN 06/10/2014 21  6 - 23 mg/dL Final  . Creatinine, Ser 06/10/2014 0.99  0.50 - 1.10 mg/dL Final  . Calcium 06/10/2014 8.8  8.4 - 10.5 mg/dL Final  . GFR calc non Af Amer 06/10/2014 48* >90 mL/min Final  . GFR calc Af Amer 06/10/2014 55* >90 mL/min Final   Comment: (NOTE) The eGFR has been calculated using the CKD EPI equation. This calculation has not been validated in all clinical situations. eGFR's persistently <90 mL/min signify possible Chronic Kidney Disease.   . Anion gap 06/10/2014 8  5 - 15 Final  . WBC 06/10/2014 11.6* 4.0 - 10.5 K/uL Final  . RBC 06/10/2014 3.82* 3.87 - 5.11 MIL/uL Final  . Hemoglobin 06/10/2014 12.3  12.0 - 15.0 g/dL Final  . HCT 06/10/2014 36.9  36.0 - 46.0 % Final  . MCV 06/10/2014 96.6  78.0 - 100.0 fL Final  . MCH 06/10/2014 32.2  26.0 - 34.0 pg Final  . MCHC 06/10/2014 33.3  30.0 - 36.0 g/dL Final  . RDW 06/10/2014 14.9  11.5 - 15.5 % Final  . Platelets 06/10/2014 268  150 - 400 K/uL Final  Lab on 06/09/2014  Component Date Value Ref Range Status  . Hemoglobin 06/07/2014 12.9  12.0 - 16.0 g/dL Final  . HCT 06/07/2014 39  36 - 46 % Final  . Platelets 06/07/2014 249  150 - 399 K/L Final  . WBC 06/07/2014 11.7   Final  . Glucose 06/07/2014 101   Final  . BUN 06/07/2014 40* 4 - 21 mg/dL Final  . Creatinine 06/07/2014 1.6* 0.5 - 1.1 mg/dL Final  . Potassium 06/07/2014 5.1  3.4 - 5.3 mmol/L Final  . Sodium 06/07/2014 128* 137 - 147 mmol/L Final  . Alkaline Phosphatase 06/07/2014 134* 25 - 125 U/L Final  . ALT 06/07/2014 12  7 - 35 U/L Final  . AST 06/07/2014 25  13 - 35 U/L Final  . Bilirubin, Total 06/07/2014 0.6   Final  Admission on 05/31/2014, Discharged on 06/02/2014  Component Date Value Ref  Range Status  . Sodium 05/31/2014 137  135 - 145 mmol/L Final  . Potassium 05/31/2014 3.7  3.5 - 5.1 mmol/L Final  . Chloride 05/31/2014 102  96 - 112 mmol/L Final  . CO2 05/31/2014 27  19 - 32 mmol/L Final  . Glucose, Bld 05/31/2014 122* 70 - 99 mg/dL Final  . BUN 05/31/2014 27* 6 - 23 mg/dL Final  . Creatinine, Ser 05/31/2014 1.14* 0.50 - 1.10 mg/dL Final  . Calcium 05/31/2014 8.8  8.4 - 10.5 mg/dL Final  . Total Protein 05/31/2014 6.9  6.0 - 8.3 g/dL Final  . Albumin 05/31/2014 3.5  3.5 - 5.2 g/dL Final  . AST 05/31/2014 19  0 - 37 U/L Final  . ALT 05/31/2014 10  0 - 35 U/L Final  . Alkaline Phosphatase 05/31/2014 86  39 - 117 U/L Final  . Total Bilirubin 05/31/2014 0.9  0.3 - 1.2 mg/dL Final  . GFR calc non Af Amer 05/31/2014 40* >  90 mL/min Final  . GFR calc Af Amer 05/31/2014 47* >90 mL/min Final   Comment: (NOTE) The eGFR has been calculated using the CKD EPI equation. This calculation has not been validated in all clinical situations. eGFR's persistently <90 mL/min signify possible Chronic Kidney Disease.   . Anion gap 05/31/2014 8  5 - 15 Final  . WBC 05/31/2014 9.1  4.0 - 10.5 K/uL Final  . RBC 05/31/2014 3.57* 3.87 - 5.11 MIL/uL Final  . Hemoglobin 05/31/2014 11.3* 12.0 - 15.0 g/dL Final  . HCT 05/31/2014 34.5* 36.0 - 46.0 % Final  . MCV 05/31/2014 96.6  78.0 - 100.0 fL Final  . MCH 05/31/2014 31.7  26.0 - 34.0 pg Final  . MCHC 05/31/2014 32.8  30.0 - 36.0 g/dL Final  . RDW 05/31/2014 14.3  11.5 - 15.5 % Final  . Platelets 05/31/2014 230  150 - 400 K/uL Final  . Neutrophils Relative % 05/31/2014 72  43 - 77 % Final  . Neutro Abs 05/31/2014 6.5  1.7 - 7.7 K/uL Final  . Lymphocytes Relative 05/31/2014 18  12 - 46 % Final  . Lymphs Abs 05/31/2014 1.6  0.7 - 4.0 K/uL Final  . Monocytes Relative 05/31/2014 10  3 - 12 % Final  . Monocytes Absolute 05/31/2014 0.9  0.1 - 1.0 K/uL Final  . Eosinophils Relative 05/31/2014 0  0 - 5 % Final  . Eosinophils Absolute 05/31/2014 0.0   0.0 - 0.7 K/uL Final  . Basophils Relative 05/31/2014 0  0 - 1 % Final  . Basophils Absolute 05/31/2014 0.0  0.0 - 0.1 K/uL Final  . Color, Urine 05/31/2014 YELLOW  YELLOW Final  . APPearance 05/31/2014 CLEAR  CLEAR Final  . Specific Gravity, Urine 05/31/2014 1.013  1.005 - 1.030 Final  . pH 05/31/2014 7.0  5.0 - 8.0 Final  . Glucose, UA 05/31/2014 NEGATIVE  NEGATIVE mg/dL Final  . Hgb urine dipstick 05/31/2014 NEGATIVE  NEGATIVE Final  . Bilirubin Urine 05/31/2014 NEGATIVE  NEGATIVE Final  . Ketones, ur 05/31/2014 NEGATIVE  NEGATIVE mg/dL Final  . Protein, ur 05/31/2014 30* NEGATIVE mg/dL Final  . Urobilinogen, UA 05/31/2014 0.2  0.0 - 1.0 mg/dL Final  . Nitrite 05/31/2014 NEGATIVE  NEGATIVE Final  . Leukocytes, UA 05/31/2014 NEGATIVE  NEGATIVE Final  . Lipase 05/31/2014 17  11 - 59 U/L Final  . Squamous Epithelial / LPF 05/31/2014 RARE  RARE Final  . WBC, UA 05/31/2014 0-2  <3 WBC/hpf Final  . Troponin I 05/31/2014 0.03  <0.031 ng/mL Final   Comment:        NO INDICATION OF MYOCARDIAL INJURY.   . Sodium 06/01/2014 143  135 - 145 mmol/L Final  . Potassium 06/01/2014 4.0  3.5 - 5.1 mmol/L Final  . Chloride 06/01/2014 101  96 - 112 mmol/L Final  . CO2 06/01/2014 30  19 - 32 mmol/L Final  . Glucose, Bld 06/01/2014 101* 70 - 99 mg/dL Final  . BUN 06/01/2014 31* 6 - 23 mg/dL Final  . Creatinine, Ser 06/01/2014 1.35* 0.50 - 1.10 mg/dL Final  . Calcium 06/01/2014 9.1  8.4 - 10.5 mg/dL Final  . Total Protein 06/01/2014 6.2  6.0 - 8.3 g/dL Final  . Albumin 06/01/2014 3.2* 3.5 - 5.2 g/dL Final  . AST 06/01/2014 18  0 - 37 U/L Final  . ALT 06/01/2014 9  0 - 35 U/L Final  . Alkaline Phosphatase 06/01/2014 78  39 - 117 U/L Final  . Total Bilirubin 06/01/2014 0.9  0.3 - 1.2 mg/dL Final  . GFR calc non Af Amer 06/01/2014 33* >90 mL/min Final  . GFR calc Af Amer 06/01/2014 38* >90 mL/min Final   Comment: (NOTE) The eGFR has been calculated using the CKD EPI equation. This calculation has  not been validated in all clinical situations. eGFR's persistently <90 mL/min signify possible Chronic Kidney Disease.   . Anion gap 06/01/2014 12  5 - 15 Final  . WBC 06/01/2014 7.5  4.0 - 10.5 K/uL Final  . RBC 06/01/2014 3.39* 3.87 - 5.11 MIL/uL Final  . Hemoglobin 06/01/2014 10.9* 12.0 - 15.0 g/dL Final  . HCT 06/01/2014 33.1* 36.0 - 46.0 % Final  . MCV 06/01/2014 97.6  78.0 - 100.0 fL Final  . MCH 06/01/2014 32.2  26.0 - 34.0 pg Final  . MCHC 06/01/2014 32.9  30.0 - 36.0 g/dL Final  . RDW 06/01/2014 14.8  11.5 - 15.5 % Final  . Platelets 06/01/2014 241  150 - 400 K/uL Final  . Neutrophils Relative % 06/01/2014 66  43 - 77 % Final  . Neutro Abs 06/01/2014 5.0  1.7 - 7.7 K/uL Final  . Lymphocytes Relative 06/01/2014 24  12 - 46 % Final  . Lymphs Abs 06/01/2014 1.8  0.7 - 4.0 K/uL Final  . Monocytes Relative 06/01/2014 10  3 - 12 % Final  . Monocytes Absolute 06/01/2014 0.8  0.1 - 1.0 K/uL Final  . Eosinophils Relative 06/01/2014 0  0 - 5 % Final  . Eosinophils Absolute 06/01/2014 0.0  0.0 - 0.7 K/uL Final  . Basophils Relative 06/01/2014 0  0 - 1 % Final  . Basophils Absolute 06/01/2014 0.0  0.0 - 0.1 K/uL Final  . TSH 05/31/2014 1.457  0.350 - 4.500 uIU/mL Final   Performed at [facility]  . WBC 05/31/2014 8.1  4.0 - 10.5 K/uL Final  . RBC 05/31/2014 3.31* 3.87 - 5.11 MIL/uL Final  . Hemoglobin 05/31/2014 10.7* 12.0 - 15.0 g/dL Final  . HCT 05/31/2014 32.0* 36.0 - 46.0 % Final  . MCV 05/31/2014 96.7  78.0 - 100.0 fL Final  . MCH 05/31/2014 32.3  26.0 - 34.0 pg Final  . MCHC 05/31/2014 33.4  30.0 - 36.0 g/dL Final  . RDW 05/31/2014 14.7  11.5 - 15.5 % Final  . Platelets 05/31/2014 227  150 - 400 K/uL Final  . Creatinine, Ser 05/31/2014 1.17* 0.50 - 1.10 mg/dL Final  . GFR calc non Af Amer 05/31/2014 39* >90 mL/min Final  . GFR calc Af Amer 05/31/2014 45* >90 mL/min Final   Comment: (NOTE) The eGFR has been calculated using the CKD EPI equation. This calculation has  not been validated in all clinical situations. eGFR's persistently <90 mL/min signify possible Chronic Kidney Disease.   . Lipase 06/01/2014 17  11 - 59 U/L Final     Assessment/Plan  1. Nausea without vomiting Ondansetron 4 mg every 6 hours as needed for nausea Lab ordered: CBC, CMP, urinalysis and culture, amylase, lipase   2. Hx of pancreatitis Ordered lipase and amylase  3. Hyponatremia Ordered CMP  4. Compression fracture of L2 lumbar vertebra with routine healing This is been doing very well  5. Gastroesophageal reflux disease without esophagitis asymptomatic

## 2014-07-19 LAB — HEPATIC FUNCTION PANEL
ALT: 9 U/L (ref 7–35)
AST: 18 U/L (ref 13–35)
Alkaline Phosphatase: 87 U/L (ref 25–125)
BILIRUBIN, TOTAL: 0.4 mg/dL

## 2014-07-19 LAB — BASIC METABOLIC PANEL
BUN: 26 mg/dL — AB (ref 4–21)
Creatinine: 1.1 mg/dL (ref 0.5–1.1)
Glucose: 95 mg/dL
Potassium: 4.2 mmol/L (ref 3.4–5.3)
Sodium: 136 mmol/L — AB (ref 137–147)

## 2014-07-21 ENCOUNTER — Non-Acute Institutional Stay: Payer: Medicare Other | Admitting: Nurse Practitioner

## 2014-07-21 ENCOUNTER — Encounter: Payer: Self-pay | Admitting: Nurse Practitioner

## 2014-07-21 ENCOUNTER — Other Ambulatory Visit: Payer: Self-pay | Admitting: *Deleted

## 2014-07-21 VITALS — BP 168/78 | HR 72 | Temp 98.7°F | Resp 18

## 2014-07-21 DIAGNOSIS — I10 Essential (primary) hypertension: Secondary | ICD-10-CM

## 2014-07-21 DIAGNOSIS — E871 Hypo-osmolality and hyponatremia: Secondary | ICD-10-CM

## 2014-07-21 DIAGNOSIS — E875 Hyperkalemia: Secondary | ICD-10-CM | POA: Diagnosis not present

## 2014-07-21 DIAGNOSIS — R110 Nausea: Secondary | ICD-10-CM | POA: Diagnosis not present

## 2014-07-21 DIAGNOSIS — K5900 Constipation, unspecified: Secondary | ICD-10-CM | POA: Diagnosis not present

## 2014-07-21 DIAGNOSIS — F329 Major depressive disorder, single episode, unspecified: Secondary | ICD-10-CM | POA: Diagnosis not present

## 2014-07-21 DIAGNOSIS — I482 Chronic atrial fibrillation, unspecified: Secondary | ICD-10-CM

## 2014-07-21 DIAGNOSIS — Z8719 Personal history of other diseases of the digestive system: Secondary | ICD-10-CM

## 2014-07-21 DIAGNOSIS — M4854XS Collapsed vertebra, not elsewhere classified, thoracic region, sequela of fracture: Secondary | ICD-10-CM

## 2014-07-21 DIAGNOSIS — K219 Gastro-esophageal reflux disease without esophagitis: Secondary | ICD-10-CM | POA: Diagnosis not present

## 2014-07-21 DIAGNOSIS — N289 Disorder of kidney and ureter, unspecified: Secondary | ICD-10-CM

## 2014-07-21 DIAGNOSIS — F32A Depression, unspecified: Secondary | ICD-10-CM

## 2014-07-21 DIAGNOSIS — E43 Unspecified severe protein-calorie malnutrition: Secondary | ICD-10-CM

## 2014-07-21 MED ORDER — FENTANYL 25 MCG/HR TD PT72: 25.0000 ug | MEDICATED_PATCH | TRANSDERMAL | Status: DC

## 2014-07-21 NOTE — Assessment & Plan Note (Signed)
Na 136 07/19/14.

## 2014-07-21 NOTE — Assessment & Plan Note (Signed)
06/15/14 Bun/creat 42/1.68-encourage oral fluid intake-dc Tramadol. Update BMP 07/19/14 Bun/creat 26/1.11  

## 2014-07-21 NOTE — Telephone Encounter (Signed)
[facility] 

## 2014-07-21 NOTE — Assessment & Plan Note (Signed)
Heart rate is in control. Continue Digoxin. 07/19/14 Dig level 1.0

## 2014-07-21 NOTE — Progress Notes (Signed)
Patient ID: [name], female   DOB: [birth year], 81 y.o.   MRN: [id number]   Code Status: DNR  Allergies  Allergen Reactions  . Codeine Other (See Comments)    Unknown reaction. Info from a MAR.  Marland Kitchen. Codeine     Unknown.    Chief Complaint  Patient presents with  . Medical Management of Chronic Issues  . Acute Visit  . Anxiety    HPI: Patient is a 81 y.o. female [facility] for evaluation of nausea, anxiety, and other chronic medical conditions.  Problem List Items Addressed This Visit    Essential hypertension (Chronic)    Chronic elevated SBP--takes Clonidine 0.1mg  daily, Hydralazine 50mg  bid, Metoprolol 75mg  bid.         Hyponatremia (Chronic)    Na 136 07/19/14.      A-fib (Chronic)    Heart rate is in control. Continue Digoxin. 07/19/14 Dig level 1.0       Renal insufficiency (Chronic)    06/15/14 Bun/creat 42/1.68-encourage oral fluid intake-dc Tramadol. Update BMP 07/19/14 Bun/creat 26/1.11      Hx of pancreatitis    10/12/13 Amylase 50(0-105) Lipase 11(0-75) 11/23/13 Amylase 39. Lipase 14.  07/19/14 Amylase 57, Lipase 11      GERD (gastroesophageal reflux disease)    C/o nausea. Continue Omeprazole. Zofran prn available to her. Increase Lorazepam to 0.5mg  bid         Depression    Better to stabilize mood with increased Mirtazapine 15mg  since 07/14/14      Constipation    continue her MiraLAX daily and prn MOM      Compression fracture of thoracic vertebra, non-traumatic    Pain is managed with Fentanyl 7025mcg/hr and back brace. Norco and Tylenol prn available to her       Protein-calorie malnutrition, severe    07/19/14 total protein 6.6, albumin 3.8       Hyperkalemia    Off Kcl, K 4.2 07/19/14      Nausea without vomiting - Primary    Still c/o nausea at times. 07/19/14 Amylase 57, Lipase 11, Digoxin level 1.0. 4/2 urine culture showed no growth. Prn Zofran available to her. Will increase Lorazepam to 0.5mg   bid in addition to prn and Mirtazapine 15mg  since 07/14/14         Review of Systems:  Review of Systems  Constitutional: Positive for fatigue and unexpected weight change. Negative for fever, chills and diaphoresis.  HENT: Positive for hearing loss. Negative for congestion, ear discharge, ear pain, nosebleeds, sore throat and tinnitus.   Eyes: Negative for photophobia, pain, discharge and redness.  Respiratory: Negative for cough, shortness of breath, wheezing and stridor.   Cardiovascular: Negative for chest pain, palpitations and leg swelling.  Gastrointestinal: Positive for nausea. Negative for vomiting, abdominal pain, diarrhea, constipation and blood in stool.  Endocrine: Negative for polydipsia.  Genitourinary: Positive for frequency. Negative for dysuria, urgency, hematuria and flank pain.  Musculoskeletal: Positive for back pain and gait problem. Negative for myalgias and neck pain.       Much improved  Skin: Negative for rash.  Allergic/Immunologic: Negative for environmental allergies.  Neurological: Positive for weakness. Negative for dizziness, tremors, seizures and headaches.  Hematological: Does not bruise/bleed easily.  Psychiatric/Behavioral: Negative for suicidal ideas and hallucinations. The patient is nervous/anxious.      Past Medical History  Diagnosis Date  . Anxiety   . GERD (gastroesophageal reflux disease)   . Anemia   .  Hyperlipidemia   . Back pain   . Hypertension   . Hypothyroidism   . Atrial fibrillation   . Hyponatremia     hx of  . Syncope     hx syncope and collapse per paper from [facility]  . Unconscious 04/06/12    per [facility] -hx unconscious episodes -pt asked not to be sent to hospital for these  . Fx lumbar vertebra-closed     [facility]  . Lower back pain     hx of  . Renal insufficiency 06/07/2014  . A-fib 12/16/2012    12/28/13 Digoxin level 0.5   . Abdominal pain 05/31/2014  . CKD (chronic  kidney disease) stage 3, GFR 30-59 ml/min 05/31/2014  . Compression fracture of L2 lumbar vertebra with routine healing 06/02/2014     Most likely due to thoracic compression fractures, she does have mild compression fracture of T11 and L2. - Discussed the case with neurosurgery Dr. Lovell Sheehan and he recommended narcotics and a brace - Physical therapy evaluated the patient and recommended home PT - Her pain was controlled with Vicodin and fentanyl.    . Compression fracture of thoracic vertebra, non-traumatic 06/02/2014  . Constipation 12/16/2012  . Depression 08/19/2012    Mirtazapine 7.5mg  nightly helped.    Marland Kitchen Hx of pancreatitis 04/06/2012    10/12/13 Amylase 50(0-105) Lipase 11(0-75) 11/23/13 Amylase 39. Lipase 14.    . Protein-calorie malnutrition, severe 06/08/2014   Past Surgical History  Procedure Laterality Date  . Abdominal hysterectomy    . Cataract extraction w/ intraocular lens  implant, bilateral    . Abdominal hysterectomy  1987  . Tonsillectomy and adenoidectomy    . Breast lumpectomy    . Colonoscopy  2003    Dr. Randa Evens   Social History:   reports that she has never smoked. She has never used smokeless tobacco. She reports that she does not drink alcohol or use illicit drugs.  Family History  Problem Relation Age of Onset  . Bleeding Disorder Father     "died of blood poisoning"    Medications: Patient's Medications  New Prescriptions   No medications on file  Previous Medications   ACETAMINOPHEN (TYLENOL) 325 MG TABLET    Take 650 mg by mouth every 4 (four) hours as needed for mild pain or fever.   BRIMONIDINE (ALPHAGAN) 0.2 % OPHTHALMIC SOLUTION    Place 1 drop into both eyes 2 (two) times daily.   CHOLECALCIFEROL (VITAMIN D) 2000 UNITS TABLET    Take 2,000 Units by mouth every morning.   CLONIDINE (CATAPRES) 0.1 MG TABLET    Take 0.1 mg by mouth daily.   DIGOXIN (LANOXIN) 0.125 MG TABLET    Take 0.125 mg by mouth daily. Take 1/2 tablet every morning * check pulse  daily*   FENTANYL (DURAGESIC) 25 MCG/HR PATCH    Place 1 patch (25 mcg total) onto the skin every 3 (three) days.   HYDRALAZINE (APRESOLINE) 50 MG TABLET    Take 50 mg by mouth 2 (two) times daily.   HYDROCODONE-ACETAMINOPHEN (NORCO) 7.5-325 MG PER TABLET    Take 1 tablet by mouth every 6 (six) hours as needed for moderate pain.   LORAZEPAM (ATIVAN) 0.5 MG TABLET    Take 0.5 mg by mouth. Take one tablet daily as needed for anxiety   LORAZEPAM (ATIVAN) 0.5 MG TABLET    Take 1 tablet (0.5 mg total) by mouth every morning.   MAGNESIUM HYDROXIDE (  MILK OF MAGNESIA) 400 MG/5ML SUSPENSION    Take 30 mLs by mouth daily as needed for mild constipation.   METOPROLOL TARTRATE (LOPRESSOR) 25 MG TABLET    Take 3 tablets (75 mg total) by mouth 2 (two) times daily.   MIRTAZAPINE (REMERON) 7.5 MG TABLET    Take 7.5 mg by mouth at bedtime.   OMEPRAZOLE (PRILOSEC) 20 MG CAPSULE    Take 1 capsule by mouth every morning.   ONDANSETRON (ZOFRAN) 4 MG TABLET    Take 4 mg by mouth every 8 (eight) hours as needed for nausea or vomiting.   POLYETHYLENE GLYCOL (MIRALAX / GLYCOLAX) PACKET    Take 17 g by mouth daily as needed for moderate constipation.   VITAMIN B-12 (CYANOCOBALAMIN) 500 MCG TABLET    Take 500 mcg by mouth every morning.  Modified Medications   No medications on file  Discontinued Medications   No medications on file     Physical Exam: Physical Exam  Constitutional: She is oriented to person, place, and time. She appears well-developed and well-nourished. No distress.  HENT:  Head: Normocephalic and atraumatic.  Mouth/Throat: No oropharyngeal exudate.  Eyes: Conjunctivae and EOM are normal. Pupils are equal, round, and reactive to light. Right eye exhibits no discharge. Left eye exhibits no discharge.  Neck: Normal range of motion. Neck supple. No JVD present. No thyromegaly present.  Cardiovascular: Normal rate, regular rhythm and normal heart sounds.   No murmur heard. Pulmonary/Chest: Effort  normal and breath sounds normal. No respiratory distress. She has no wheezes. She has no rales.  Abdominal: Soft. Bowel sounds are normal. She exhibits no distension. There is no tenderness.  Right inguinal hernia  Musculoskeletal: She exhibits tenderness (lower back). She exhibits no edema.  Back pain is managed with lumbar brace  Lymphadenopathy:    She has no cervical adenopathy.  Neurological: She is alert and oriented to person, place, and time. She has normal reflexes. No cranial nerve deficit. She exhibits normal muscle tone. Coordination normal.  Skin: Skin is warm and dry. No rash noted. She is not diaphoretic. No erythema.  Psychiatric: She has a normal mood and affect. Her behavior is normal. Thought content normal. Her mood appears not anxious. Her affect is not angry, not blunt, not labile and not inappropriate. Her speech is not rapid and/or pressured, not delayed, not tangential and not slurred. She is not agitated, not aggressive, not hyperactive, not withdrawn, not actively hallucinating and not combative. Thought content is not paranoid and not delusional. Cognition and memory are impaired. She does not exhibit a depressed mood. She is communicative. She exhibits abnormal recent memory.    There were no vitals filed for this visit.    Labs reviewed: Basic Metabolic Panel:  Recent Labs  16/10/96  05/31/14 2359  06/08/14 0400 06/09/14 0354 06/10/14 0645  06/15/14 06/20/14 07/19/14  NA 140  < >  --   < > 135 134* 137  < > 140 137 136*  K 4.5  < >  --   < > 4.0 3.4* 4.3  < > 5.5* 4.7 4.2  CL  --   < >  --   < > 100 100 102  --   --   --   --   CO2  --   < >  --   < > 25 25 27   --   --   --   --   GLUCOSE  --   < >  --   < >  89 93 118*  --   --   --   --   BUN 27*  < >  --   < > 29* 21 21  < > 42* 41* 26*  CREATININE 1.3*  < > 1.17*  < > 1.00 0.91 0.99  < > 1.7* 1.5* 1.1  CALCIUM  --   < >  --   < > 8.6 8.3* 8.8  --   --   --   --   TSH 2.01  --  1.457  --   --   --   --    --   --   --   --   < > = values in this interval not displayed. Liver Function Tests:  Recent Labs  06/01/14 0510  06/07/14 1651 06/08/14 0400 06/13/14 07/19/14  AST 18  < > 24 21 17 18   ALT 9  < > 15 12 10 9   ALKPHOS 78  < > 134* 115 138* 87  BILITOT 0.9  --  0.7 0.6  --   --   PROT 6.2  --  6.9 6.2  --   --   ALBUMIN 3.2*  --  3.6 3.2*  --   --   < > = values in this interval not displayed.  Recent Labs  05/31/14 2052 06/01/14 0510 06/07/14 1651  LIPASE 17 17 19    No results for input(s): AMMONIA in the last 8760 hours. CBC:  Recent Labs  06/01/14 0510  06/07/14 1651  06/08/14 0927 06/09/14 0354 06/10/14 0645  WBC 7.5  < > 10.9*  < > 8.0 7.8 11.6*  NEUTROABS 5.0  --  8.2*  --  5.4  --   --   HGB 10.9*  < > 12.7  < > 10.9* 11.7* 12.3  HCT 33.1*  < > 38.1  < > 33.4* 35.8* 36.9  MCV 97.6  --  95.3  < > 96.0 96.2 96.6  PLT 241  < > 240  < > 215 216 268  < > = values in this interval not displayed. Lipid Panel: No results for input(s): CHOL, HDL, LDLCALC, TRIG, CHOLHDL, LDLDIRECT in the last 8760 hours.  Past Procedures:  05/31/14 CT abd and pelvis wo contrast:   IMPRESSION: Enlargement and slight ill definition of the pancreatic tail suspicious for acute pancreatitis; recommend correlation with serum amylase/lipase.  Hepatic and probable high attenuation RIGHT renal cysts.  Extensive atherosclerotic disease with mild aneurysmal dilatation of the mid abdominal aorta, maximum 3.6 x 3.2 cm.  LEFT inguinal hernia containing an unobstructed small bowel loop.  Osteoporosis with extensive compression deformities of the thoracolumbar spine.   06/01/14 MRI thoracic spine:  IMPRESSION: Numerous compression fractures throughout the thoracic spine which appear benign.  Mild compression fracture T7 which appears acute or subacute.  Mild compression fracture T9 which appears acute or subacute  Compression fractures at T11 and L2 contain a mild amount  of edema and may be late subacute fractures  No cord compression.  06/07/14 [place] abd:  IMPRESSION: 1. Normal sonographic appearance of the gallbladder. No biliary dilatation. 2. Cyst in the left hepatic lobe.  06/07/14 CT abd pelvis wo contrast:   IMPRESSION: No definite acute intra-abdominal or pelvic finding by noncontrast imaging. Limited exam with motion artifact.  Trace pleural effusions and bibasilar atelectasis  Tortuous atherosclerotic aneurysmal abdominal aorta, maximal diameter 3.1 cm.  No acute obstructive uropathy or hydronephrosis. No obstructing urinary tract calculus.  Sigmoid diverticulosis  Left  inguinal hernia containing small bowel but no evidence of incarceration or obstruction.   Assessment/Plan Nausea without vomiting Still c/o nausea at times. 07/19/14 Amylase 57, Lipase 11, Digoxin level 1.0. 4/2 urine culture showed no growth. Prn Zofran available to her. Will increase Lorazepam to 0.5mg  bid in addition to prn and Mirtazapine  since 07/14/14   Depression Better to stabilize mood with increased Mirtazapine  since 07/14/14   GERD (gastroesophageal reflux disease) C/o nausea. Continue Omeprazole. Zofran prn available to her. Increase Lorazepam to 0.5mg  bid      Hyperkalemia Off Kcl, K 4.2 07/19/14   Protein-calorie malnutrition, severe 07/19/14 total protein 6.6, albumin 3.8    Hx of pancreatitis 10/12/13 Amylase 50(0-105) Lipase 11(0-75) 11/23/13 Amylase 39. Lipase 14.  07/19/14 Amylase 57, Lipase 11   Renal insufficiency 06/15/14 Bun/creat 42/1.68-encourage oral fluid intake-dc Tramadol. Update BMP 07/19/14 Bun/creat 26/1.11   Hyponatremia Na 136 07/19/14.   Compression fracture of thoracic vertebra, non-traumatic Pain is managed with Fentanyl 41mcg/hr and back brace. Norco and Tylenol prn available to her    Essential hypertension Chronic elevated SBP--takes Clonidine 0.1mg  daily, Hydralazine  bid, Metoprolol   bid.      A-fib Heart rate is in control. Continue Digoxin. 07/19/14 Dig level 1.0    Constipation continue her MiraLAX daily and prn MOM     Family/ Staff Communication: observe the patient  Goals of Care: AL  Labs/tests ordered: CBC, CMP amylase, lipase, dig level done 07/19/14   This encounter was created in error - please disregard.

## 2014-07-21 NOTE — Assessment & Plan Note (Signed)
Off Kcl, K 4.2 07/19/14

## 2014-07-21 NOTE — Progress Notes (Signed)
Patient ID: [name], female   DOB: Aug 31, 1918, 81 y.o.   MRN: [id number]   Code Status: DNR  Allergies  Allergen Reactions  . Codeine Other (See Comments)    Unknown reaction. Info from a MAR.  Marland Kitchen. Codeine     Unknown.    Chief Complaint  Patient presents with  . Medical Management of Chronic Issues  . Anxiety    HPI: Patient is a 81 y.o. female [facility] today for anxiety and other chronic medical conditions.  Problem List Items Addressed This Visit    A-fib (Chronic)    Heart rate is in control. Continue Digoxin 07/19/14 digoxin level 1.0      Compression fracture of thoracic vertebra, non-traumatic    Pain is managed with Fentanyl 4025mcg/hr and back brace. Norco and Tylenol prn available to her        Constipation    continue her MiraLAX daily and prn MOM       Depression    Better to stabilize mood with increased Mirtazapine 15mg  since 07/14/14       Essential hypertension - Primary (Chronic)    Chronic elevated SBP--takes Clonidine 0.1mg  daily, Hydralazine 50mg  bid, Metoprolol 75mg  bid.        GERD (gastroesophageal reflux disease)    C/o nausea-but better since taking her medicine with food.  Continue Omeprazole. Zofran prn available to her. Increase Lorazepam to 0.5mg  bid        Hx of pancreatitis    10/12/13 Amylase 50(0-105) Lipase 11(0-75) 11/23/13 Amylase 39. Lipase 14.  07/19/14 Amylase 57, Lipase 11      Hyperkalemia    06/15/14 K 5.5-dc Kcl 7510meq-f/u BMP 07/19/14 K 4.2       Hyponatremia (Chronic)    10/12/13 Na 135 11/23/13 Na 137 06/07/14 Na 128 Off Demeclocycline 150mg  bid.  06/15/14 Na 140 07/19/14 Na 136        Nausea without vomiting    Still c/o nausea at times-better when taking medicine with food. 07/19/14 Amylase 57, Lipase 11, Digoxin level 1.0. 4/2 urine culture showed no growth. Prn Zofran available to her. Will increase Lorazepam to 0.5mg  bid in addition to prn and Mirtazapine 15mg  since  07/14/14      Protein-calorie malnutrition, severe    07/19/14 total protein 6.6, albumin 3.8      Renal insufficiency (Chronic)    06/15/14 Bun/creat 42/1.68-encourage oral fluid intake-dc Tramadol. Update BMP 07/19/14 Bun/creat 26/1.11          Review of Systems:  Review of Systems  Constitutional: Positive for fatigue and unexpected weight change. Negative for fever, chills and diaphoresis.  HENT: Positive for hearing loss. Negative for congestion, ear discharge, ear pain, nosebleeds, sore throat and tinnitus.   Eyes: Negative for photophobia, pain, discharge and redness.  Respiratory: Negative for cough, shortness of breath, wheezing and stridor.   Cardiovascular: Negative for chest pain, palpitations and leg swelling.  Gastrointestinal: Positive for nausea. Negative for vomiting, abdominal pain, diarrhea, constipation and blood in stool.       Said better since she takes medicine with food.   Endocrine: Negative for polydipsia.  Genitourinary: Positive for frequency. Negative for dysuria, urgency, hematuria and flank pain.  Musculoskeletal: Positive for back pain and gait problem. Negative for myalgias and neck pain.       Much improved  Skin: Negative for rash.  Allergic/Immunologic: Negative for environmental allergies.  Neurological: Positive for weakness. Negative for dizziness, tremors, seizures  and headaches.  Hematological: Does not bruise/bleed easily.  Psychiatric/Behavioral: Negative for suicidal ideas and hallucinations. The patient is nervous/anxious.       Past Medical History  Diagnosis Date  . Anxiety   . GERD (gastroesophageal reflux disease)   . Anemia   . Hyperlipidemia   . Back pain   . Hypertension   . Hypothyroidism   . Atrial fibrillation   . Hyponatremia     hx of  . Syncope     hx syncope and collapse per paper from [facility]  . Unconscious 04/06/12    per [facility] -hx unconscious episodes -pt asked not to be sent to  hospital for these  . Fx lumbar vertebra-closed     [facility]  . Lower back pain     hx of  . Renal insufficiency 06/07/2014  . A-fib 12/16/2012    12/28/13 Digoxin level 0.5   . Abdominal pain 05/31/2014  . CKD (chronic kidney disease) stage 3, GFR 30-59 ml/min 05/31/2014  . Compression fracture of L2 lumbar vertebra with routine healing 06/02/2014     Most likely due to thoracic compression fractures, she does have mild compression fracture of T11 and L2. - Discussed the case with neurosurgery Dr. Lovell Sheehan and he recommended narcotics and a brace - Physical therapy evaluated the patient and recommended home PT - Her pain was controlled with Vicodin and fentanyl.    . Compression fracture of thoracic vertebra, non-traumatic 06/02/2014  . Constipation 12/16/2012  . Depression 08/19/2012    Mirtazapine 7.5mg  nightly helped.    Marland Kitchen Hx of pancreatitis 04/06/2012    10/12/13 Amylase 50(0-105) Lipase 11(0-75) 11/23/13 Amylase 39. Lipase 14.    . Protein-calorie malnutrition, severe 06/08/2014   Past Surgical History  Procedure Laterality Date  . Abdominal hysterectomy    . Cataract extraction w/ intraocular lens  implant, bilateral    . Abdominal hysterectomy  1987  . Tonsillectomy and adenoidectomy    . Breast lumpectomy    . Colonoscopy  2003    Dr. Randa Evens   Social History:   reports that she has never smoked. She has never used smokeless tobacco. She reports that she does not drink alcohol or use illicit drugs.  Family History  Problem Relation Age of Onset  . Bleeding Disorder Father     "died of blood poisoning"    Medications: Patient's Medications  New Prescriptions   No medications on file  Previous Medications   ACETAMINOPHEN (TYLENOL) 325 MG TABLET    Take 650 mg by mouth every 4 (four) hours as needed for mild pain or fever.   BRIMONIDINE (ALPHAGAN) 0.2 % OPHTHALMIC SOLUTION    Place 1 drop into both eyes 2 (two) times daily.   CHOLECALCIFEROL (VITAMIN D) 2000  UNITS TABLET    Take 2,000 Units by mouth every morning.   CLONIDINE (CATAPRES) 0.1 MG TABLET    Take 0.1 mg by mouth daily.   DIGOXIN (LANOXIN) 0.125 MG TABLET    Take 0.125 mg by mouth daily. Take 1/2 tablet every morning * check pulse daily*   FENTANYL (DURAGESIC) 25 MCG/HR PATCH    Place 1 patch (25 mcg total) onto the skin every 3 (three) days.   HYDRALAZINE (APRESOLINE) 50 MG TABLET    Take 50 mg by mouth 2 (two) times daily.   HYDROCODONE-ACETAMINOPHEN (NORCO) 7.5-325 MG PER TABLET    Take 1 tablet by mouth every 6 (six) hours as needed for  moderate pain.   LORAZEPAM (ATIVAN) 0.5 MG TABLET    Take 0.5 mg by mouth. Take one tablet daily as needed for anxiety   LORAZEPAM (ATIVAN) 0.5 MG TABLET    Take 1 tablet (0.5 mg total) by mouth every morning.   MAGNESIUM HYDROXIDE (MILK OF MAGNESIA) 400 MG/5ML SUSPENSION    Take 30 mLs by mouth daily as needed for mild constipation.   METOPROLOL TARTRATE (LOPRESSOR) 25 MG TABLET    Take 3 tablets (75 mg total) by mouth 2 (two) times daily.   MIRTAZAPINE (REMERON) 7.5 MG TABLET    Take 7.5 mg by mouth. Take 15mg  daily at bedtime   OMEPRAZOLE (PRILOSEC) 20 MG CAPSULE    Take 1 capsule by mouth every morning.   ONDANSETRON (ZOFRAN) 4 MG TABLET    Take 4 mg by mouth. Take one every 6 hours as needed for nausea or vomiting   POLYETHYLENE GLYCOL (MIRALAX / GLYCOLAX) PACKET    Take 17 g by mouth daily as needed for moderate constipation.   VITAMIN B-12 (CYANOCOBALAMIN) 500 MCG TABLET    Take 500 mcg by mouth every morning.  Modified Medications   No medications on file  Discontinued Medications   No medications on file     Physical Exam: Physical Exam  Constitutional: She is oriented to person, place, and time. She appears well-developed and well-nourished. No distress.  HENT:  Head: Normocephalic and atraumatic.  Mouth/Throat: No oropharyngeal exudate.  Eyes: Conjunctivae and EOM are normal. Pupils are equal, round, and reactive to light. Right eye  exhibits no discharge. Left eye exhibits no discharge.  Neck: Normal range of motion. Neck supple. No JVD present. No thyromegaly present.  Cardiovascular: Normal rate, regular rhythm and normal heart sounds.   No murmur heard. Pulmonary/Chest: Effort normal and breath sounds normal. No respiratory distress. She has no wheezes. She has no rales.  Abdominal: Soft. Bowel sounds are normal. She exhibits no distension. There is no tenderness.  Right inguinal hernia  Musculoskeletal: She exhibits tenderness (lower back). She exhibits no edema.  Back pain is managed with lumbar brace  Lymphadenopathy:    She has no cervical adenopathy.  Neurological: She is alert and oriented to person, place, and time. She has normal reflexes. No cranial nerve deficit. She exhibits normal muscle tone. Coordination normal.  Skin: Skin is warm and dry. No rash noted. She is not diaphoretic. No erythema.  Psychiatric: She has a normal mood and affect. Her behavior is normal. Thought content normal. Her mood appears not anxious. Her affect is not angry, not blunt, not labile and not inappropriate. Her speech is not rapid and/or pressured, not delayed, not tangential and not slurred. She is not agitated, not aggressive, not hyperactive, not withdrawn, not actively hallucinating and not combative. Thought content is not paranoid and not delusional. Cognition and memory are impaired. She does not exhibit a depressed mood. She is communicative. She exhibits abnormal recent memory.    Filed Vitals:   07/21/14 1559  BP: 168/78  Pulse: 72  Temp: 98.7 F (37.1 C)  TempSrc: Tympanic  Resp: 18      Labs reviewed: Basic Metabolic Panel:  Recent Labs  16/10/96  05/31/14 2359  06/08/14 0400 06/09/14 0354 06/10/14 0645  06/15/14 06/20/14 07/19/14  NA 140  < >  --   < > 135 134* 137  < > 140 137 136*  K 4.5  < >  --   < > 4.0 3.4* 4.3  < >  5.5* 4.7 4.2  CL  --   < >  --   < > 100 100 102  --   --   --   --   CO2  --    < >  --   < > 25 25 27   --   --   --   --   GLUCOSE  --   < >  --   < > 89 93 118*  --   --   --   --   BUN 27*  < >  --   < > 29* 21 21  < > 42* 41* 26*  CREATININE 1.3*  < > 1.17*  < > 1.00 0.91 0.99  < > 1.7* 1.5* 1.1  CALCIUM  --   < >  --   < > 8.6 8.3* 8.8  --   --   --   --   TSH 2.01  --  1.457  --   --   --   --   --   --   --   --   < > = values in this interval not displayed. Liver Function Tests:  Recent Labs  06/01/14 0510  06/07/14 1651 06/08/14 0400 06/13/14 07/19/14  AST 18  < > 24 21 17 18   ALT 9  < > 15 12 10 9   ALKPHOS 78  < > 134* 115 138* 87  BILITOT 0.9  --  0.7 0.6  --   --   PROT 6.2  --  6.9 6.2  --   --   ALBUMIN 3.2*  --  3.6 3.2*  --   --   < > = values in this interval not displayed.  Recent Labs  05/31/14 2052 06/01/14 0510 06/07/14 1651  LIPASE 17 17 19    No results for input(s): AMMONIA in the last 8760 hours. CBC:  Recent Labs  06/01/14 0510  06/07/14 1651  06/08/14 0927 06/09/14 0354 06/10/14 0645  WBC 7.5  < > 10.9*  < > 8.0 7.8 11.6*  NEUTROABS 5.0  --  8.2*  --  5.4  --   --   HGB 10.9*  < > 12.7  < > 10.9* 11.7* 12.3  HCT 33.1*  < > 38.1  < > 33.4* 35.8* 36.9  MCV 97.6  --  95.3  < > 96.0 96.2 96.6  PLT 241  < > 240  < > 215 216 268  < > = values in this interval not displayed. Lipid Panel: No results for input(s): CHOL, HDL, LDLCALC, TRIG, CHOLHDL, LDLDIRECT in the last 8760 hours. Anemia Panel: No results for input(s): FOLATE, IRON, VITAMINB12 in the last 8760 hours.  Past Procedures: 05/31/14 CT abd and pelvis wo contrast:   IMPRESSION: Enlargement and slight ill definition of the pancreatic tail suspicious for acute pancreatitis; recommend correlation with serum amylase/lipase.  Hepatic and probable high attenuation RIGHT renal cysts.  Extensive atherosclerotic disease with mild aneurysmal dilatation of the mid abdominal aorta, maximum 3.6 x 3.2 cm.  LEFT inguinal hernia containing an unobstructed small bowel  loop.  Osteoporosis with extensive compression deformities of the thoracolumbar spine.   06/01/14 MRI thoracic spine:  IMPRESSION: Numerous compression fractures throughout the thoracic spine which appear benign.  Mild compression fracture T7 which appears acute or subacute.  Mild compression fracture T9 which appears acute or subacute  Compression fractures at T11 and L2 contain a mild amount of edema and may be late  subacute fractures  No cord compression.  06/07/14 [place] abd:  IMPRESSION: 1. Normal sonographic appearance of the gallbladder. No biliary dilatation. 2. Cyst in the left hepatic lobe.  06/07/14 CT abd pelvis wo contrast:   IMPRESSION: No definite acute intra-abdominal or pelvic finding by noncontrast imaging. Limited exam with motion artifact.  Trace pleural effusions and bibasilar atelectasis  Tortuous atherosclerotic aneurysmal abdominal aorta, maximal diameter 3.1 cm.  No acute obstructive uropathy or hydronephrosis. No obstructing urinary tract calculus.  Sigmoid diverticulosis  Left inguinal hernia containing small bowel but no evidence of incarceration or obstruction.   Assessment/Plan Essential hypertension Chronic elevated SBP--takes Clonidine 0.1mg  daily, Hydralazine  bid, Metoprolol  bid.     Hyponatremia 10/12/13 Na 135 11/23/13 Na 137 06/07/14 Na 128 Off Demeclocycline  bid.  06/15/14 Na 140 07/19/14 Na 136     A-fib Heart rate is in control. Continue Digoxin 07/19/14 digoxin level 1.0   Renal insufficiency 06/15/14 Bun/creat 42/1.68-encourage oral fluid intake-dc Tramadol. Update BMP 07/19/14 Bun/creat 26/1.11    Hx of pancreatitis 10/12/13 Amylase 50(0-105) Lipase 11(0-75) 11/23/13 Amylase 39. Lipase 14.  07/19/14 Amylase 57, Lipase 11   GERD (gastroesophageal reflux disease) C/o nausea-but better since taking her medicine with food.  Continue Omeprazole. Zofran prn available to her. Increase Lorazepam  to 0.5mg  bid     Depression Better to stabilize mood with increased Mirtazapine  since 07/14/14    Constipation continue her MiraLAX daily and prn MOM    Compression fracture of thoracic vertebra, non-traumatic Pain is managed with Fentanyl 8mcg/hr and back brace. Norco and Tylenol prn available to her     Protein-calorie malnutrition, severe 07/19/14 total protein 6.6, albumin 3.8   Hyperkalemia 06/15/14 K 5.5-dc Kcl 80meq-f/u BMP 07/19/14 K 4.2    Nausea without vomiting Still c/o nausea at times-better when taking medicine with food. 07/19/14 Amylase 57, Lipase 11, Digoxin level 1.0. 4/2 urine culture showed no growth. Prn Zofran available to her. Will increase Lorazepam to 0.5mg  bid in addition to prn and Mirtazapine  since 07/14/14     Family/ Staff Communication: observe the patient.   Goals of Care: AL  Labs/tests ordered: CBC, CMP amylase, lipase, dig level done 07/19/14

## 2014-07-21 NOTE — Assessment & Plan Note (Signed)
06/15/14 K 5.5-dc Kcl 2910meq-f/u BMP 07/19/14 K 4.2

## 2014-07-21 NOTE — Assessment & Plan Note (Signed)
07/19/14 total protein 6.6, albumin 3.8

## 2014-07-21 NOTE — Assessment & Plan Note (Signed)
Heart rate is in control. Continue Digoxin 07/19/14 digoxin level 1.0 

## 2014-07-21 NOTE — Assessment & Plan Note (Signed)
C/o nausea-but better since taking her medicine with food.  Continue Omeprazole. Zofran prn available to her. Increase Lorazepam to 0.5mg  bid

## 2014-07-21 NOTE — Assessment & Plan Note (Signed)
Pain is managed with Fentanyl 3325mcg/hr and back brace. Norco and Tylenol prn available to her

## 2014-07-21 NOTE — Assessment & Plan Note (Signed)
Chronic elevated SBP--takes Clonidine 0.1mg daily, Hydralazine 50mg bid, Metoprolol 75mg bid.    

## 2014-07-21 NOTE — Assessment & Plan Note (Signed)
06/15/14 Bun/creat 42/1.68-encourage oral fluid intake-dc Tramadol. Update BMP 07/19/14 Bun/creat 26/1.11

## 2014-07-21 NOTE — Assessment & Plan Note (Signed)
continue her MiraLAX daily and prn MOM 

## 2014-07-21 NOTE — Assessment & Plan Note (Signed)
10/12/13 Amylase 50(0-105) Lipase 11(0-75) 11/23/13 Amylase 39. Lipase 14.  07/19/14 Amylase 57, Lipase 11 

## 2014-07-21 NOTE — Assessment & Plan Note (Signed)
Chronic elevated SBP--takes Clonidine 0.1mg  daily, Hydralazine 50mg  bid, Metoprolol 75mg  bid.

## 2014-07-21 NOTE — Assessment & Plan Note (Signed)
Still c/o nausea at times. 07/19/14 Amylase 57, Lipase 11, Digoxin level 1.0. 4/2 urine culture showed no growth. Prn Zofran available to her. Will increase Lorazepam to 0.5mg  bid in addition to prn and Mirtazapine 15mg  since 07/14/14

## 2014-07-21 NOTE — Assessment & Plan Note (Signed)
C/o nausea. Continue Omeprazole. Zofran prn available to her. Increase Lorazepam to 0.5mg  bid

## 2014-07-21 NOTE — Assessment & Plan Note (Signed)
Pain is managed with Fentanyl 25mcg/hr and back brace. Norco and Tylenol prn available to her  

## 2014-07-21 NOTE — Assessment & Plan Note (Signed)
07/19/14 total protein 6.6, albumin 3.8 

## 2014-07-21 NOTE — Assessment & Plan Note (Signed)
continue her MiraLAX daily and prn MOM

## 2014-07-21 NOTE — Assessment & Plan Note (Signed)
10/12/13 Amylase 50(0-105) Lipase 11(0-75) 11/23/13 Amylase 39. Lipase 14.  07/19/14 Amylase 57, Lipase 11

## 2014-07-21 NOTE — Assessment & Plan Note (Signed)
Better to stabilize mood with increased Mirtazapine 15mg  since 07/14/14

## 2014-07-21 NOTE — Assessment & Plan Note (Signed)
10/12/13 Na 135 11/23/13 Na 137 06/07/14 Na 128 Off Demeclocycline 150mg  bid.  06/15/14 Na 140 07/19/14 Na 136

## 2014-07-21 NOTE — Assessment & Plan Note (Signed)
Better to stabilize mood with increased Mirtazapine 15mg since 07/14/14  

## 2014-07-21 NOTE — Assessment & Plan Note (Signed)
Still c/o nausea at times-better when taking medicine with food. 07/19/14 Amylase 57, Lipase 11, Digoxin level 1.0. 4/2 urine culture showed no growth. Prn Zofran available to her. Will increase Lorazepam to 0.5mg  bid in addition to prn and Mirtazapine 15mg  since 07/14/14

## 2014-08-17 ENCOUNTER — Other Ambulatory Visit: Payer: Self-pay | Admitting: *Deleted

## 2014-08-17 MED ORDER — FENTANYL 25 MCG/HR TD PT72: 25.0000 ug | MEDICATED_PATCH | TRANSDERMAL | Status: DC

## 2014-09-16 ENCOUNTER — Other Ambulatory Visit: Payer: Self-pay | Admitting: *Deleted

## 2014-09-16 MED ORDER — FENTANYL 25 MCG/HR TD PT72: 25.0000 ug | MEDICATED_PATCH | TRANSDERMAL | Status: DC

## 2014-09-16 NOTE — Telephone Encounter (Signed)
[facility] 

## 2014-10-13 ENCOUNTER — Other Ambulatory Visit: Payer: Self-pay | Admitting: *Deleted

## 2014-10-13 MED ORDER — FENTANYL 25 MCG/HR TD PT72: 25.0000 ug | MEDICATED_PATCH | TRANSDERMAL | Status: DC

## 2014-10-13 NOTE — Telephone Encounter (Signed)
[facility] 

## 2014-11-09 ENCOUNTER — Other Ambulatory Visit: Payer: Self-pay

## 2014-11-09 MED ORDER — FENTANYL 25 MCG/HR TD PT72: 25.0000 ug | MEDICATED_PATCH | TRANSDERMAL | Status: DC

## 2014-11-09 NOTE — Telephone Encounter (Signed)
RX sent to [facility] @ [phone], phone number [phone]. This is a nursing home refill request.   

## 2014-12-09 ENCOUNTER — Other Ambulatory Visit: Payer: Self-pay

## 2014-12-09 MED ORDER — FENTANYL 25 MCG/HR TD PT72: MEDICATED_PATCH | TRANSDERMAL | Status: DC

## 2014-12-09 NOTE — Telephone Encounter (Signed)
RX sent to [facility] @ [phone], phone number [phone]. This is a nursing home refill request.   

## 2015-01-05 ENCOUNTER — Non-Acute Institutional Stay: Payer: Medicare Other | Admitting: Nurse Practitioner

## 2015-01-05 ENCOUNTER — Encounter: Payer: Self-pay | Admitting: Nurse Practitioner

## 2015-01-05 VITALS — BP 200/92 | HR 68 | Temp 98.2°F | Wt 93.0 lb

## 2015-01-05 DIAGNOSIS — E871 Hypo-osmolality and hyponatremia: Secondary | ICD-10-CM | POA: Diagnosis not present

## 2015-01-05 DIAGNOSIS — N289 Disorder of kidney and ureter, unspecified: Secondary | ICD-10-CM

## 2015-01-05 DIAGNOSIS — E039 Hypothyroidism, unspecified: Secondary | ICD-10-CM

## 2015-01-05 DIAGNOSIS — K219 Gastro-esophageal reflux disease without esophagitis: Secondary | ICD-10-CM

## 2015-01-05 DIAGNOSIS — Z8719 Personal history of other diseases of the digestive system: Secondary | ICD-10-CM | POA: Diagnosis not present

## 2015-01-05 DIAGNOSIS — I482 Chronic atrial fibrillation, unspecified: Secondary | ICD-10-CM

## 2015-01-05 DIAGNOSIS — F05 Delirium due to known physiological condition: Secondary | ICD-10-CM

## 2015-01-05 DIAGNOSIS — I10 Essential (primary) hypertension: Secondary | ICD-10-CM

## 2015-01-05 DIAGNOSIS — F32A Depression, unspecified: Secondary | ICD-10-CM

## 2015-01-05 DIAGNOSIS — F329 Major depressive disorder, single episode, unspecified: Secondary | ICD-10-CM

## 2015-01-05 DIAGNOSIS — K5900 Constipation, unspecified: Secondary | ICD-10-CM

## 2015-01-05 DIAGNOSIS — R410 Disorientation, unspecified: Secondary | ICD-10-CM

## 2015-01-05 NOTE — Assessment & Plan Note (Signed)
06/15/14 Bun/creat 42/1.68 07/19/14 Bun/creat 26/1.11 01/05/15 schedule CMP

## 2015-01-05 NOTE — Progress Notes (Signed)
Patient ID: [name], female   DOB: May 26, 1918, 81 y.o.   MRN: [id number]  Location:  clinic [facility] Knebel:  Chipper Oman NP  Code Status:  DNR Goals of care: Advanced Directive information    Chief Complaint  Patient presents with  . Acute Visit    confusion for 3 days per nurse     HPI: Patient is a 81 y.o. female seen in the clinic at [facility] today for evaluation of increased confusion, denied dysuria, SOB, nausea, vomiting, constipation, or abd/back pain. She is afebrile. Hx of chronic pancreatitis with back pain and nausea. The patient was noted to have elevated BPs in 190s/100s, but the patient denied chest pain/pressure, change in vision, or focal neurological symptoms. She was in w/c upon my visit, answered questions appropriately.   Review of Systems:  Review of Systems  Constitutional: Negative for fever, chills and diaphoresis.  HENT: Positive for hearing loss. Negative for congestion and ear pain.   Eyes: Negative for pain, discharge and redness.  Respiratory: Negative for cough, shortness of breath, wheezing and stridor.   Cardiovascular: Negative for chest pain, palpitations and leg swelling.  Gastrointestinal: Positive for nausea. Negative for vomiting, abdominal pain, diarrhea and constipation.       Said better since she takes medicine with food.   Genitourinary: Positive for frequency. Negative for dysuria and urgency.  Musculoskeletal: Positive for back pain. Negative for myalgias and neck pain.       Much improved  Skin: Negative for rash.  Neurological: Positive for weakness. Negative for dizziness, tremors, seizures and headaches.  Psychiatric/Behavioral: Positive for memory loss. Negative for suicidal ideas and hallucinations. The patient is nervous/anxious.        Staff reported the patient has increased confusion in the past a 2-3 days    Past Medical History  Diagnosis Date  . Anxiety   . GERD (gastroesophageal reflux disease)   .  Anemia   . Hyperlipidemia   . Back pain   . Hypertension   . Hypothyroidism   . Atrial fibrillation   . Hyponatremia     hx of  . Syncope     hx syncope and collapse per paper from [facility]  . Unconscious 04/06/12    per [facility] paper -hx unconscious episodes -pt asked not to be sent to hospital for these  . Fx lumbar vertebra-closed     [facility]x of [facility]  . Lower back pain     hx of  . Renal insufficiency 06/07/2014  . A-fib 12/16/2012    12/28/13 Digoxin level 0.5   . Abdominal pain 05/31/2014  . CKD (chronic kidney disease) stage 3, GFR 30-59 ml/min 05/31/2014  . Compression fracture of L2 lumbar vertebra with routine healing 06/02/2014     Most likely due to thoracic compression fractures, she does have mild compression fracture of T11 and L2. - Discussed the case with neurosurgery Dr. Lovell Sheehan and he recommended narcotics and a brace - Physical therapy evaluated the patient and recommended home PT - Her pain was controlled with Vicodin and fentanyl.    . Compression fracture of thoracic vertebra, non-traumatic 06/02/2014  . Constipation 12/16/2012  . Depression 08/19/2012    Mirtazapine 7.5mg  nightly helped.    Marland Kitchen Hx of pancreatitis 04/06/2012    10/12/13 Amylase 50(0-105) Lipase 11(0-75) 11/23/13 Amylase 39. Lipase 14.    . Protein-calorie malnutrition, severe 06/08/2014    Patient Active Problem List   Diagnosis Date Noted  .  Subacute confusional state 01/05/2015  . Nausea without vomiting 07/14/2014  . Hyperkalemia 06/15/2014  . Protein-calorie malnutrition, severe 06/08/2014  . Renal insufficiency 06/07/2014  . Compression fracture of thoracic vertebra, non-traumatic 06/02/2014  . Compression fracture of L2 lumbar vertebra with routine healing 06/02/2014  . Abdominal pain 05/31/2014  . CKD (chronic kidney disease) stage 3, GFR 30-59 ml/min 05/31/2014  . Hypothyroidism   . Constipation 12/16/2012  . A-fib 12/16/2012  . Hyponatremia 08/19/2012  . GERD  (gastroesophageal reflux disease) 08/19/2012  . Back pain 08/19/2012  . Depression 08/19/2012  . Essential hypertension 04/10/2012  . Abnormal ECG 04/10/2012  . Hx of pancreatitis 04/06/2012    Allergies  Allergen Reactions  . Codeine Other (See Comments)    Unknown reaction. Info from a MAR.  Marland Kitchen Codeine     Unknown.    Medications: Patient's Medications  New Prescriptions   No medications on file  Previous Medications   ACETAMINOPHEN (TYLENOL) 325 MG TABLET    Take 650 mg by mouth every 4 (four) hours as needed for mild pain or fever.   BRIMONIDINE (ALPHAGAN) 0.2 % OPHTHALMIC SOLUTION    Place 1 drop into both eyes 2 (two) times daily.   CHOLECALCIFEROL (VITAMIN D) 2000 UNITS TABLET    Take 2,000 Units by mouth every morning.   CLONIDINE (CATAPRES) 0.1 MG TABLET    Take 0.1 mg by mouth daily.   DIGOXIN (LANOXIN) 0.125 MG TABLET    Take 0.125 mg by mouth daily. Take 1/2 tablet every morning * check pulse daily*   FENTANYL (DURAGESIC) 25 MCG/HR PATCH    Apply 1 patch topically every 3 days, chart site, remove old patch before applying new patch   HYDRALAZINE (APRESOLINE) 50 MG TABLET    Take 50 mg by mouth 2 (two) times daily.   LORAZEPAM (ATIVAN) 0.5 MG TABLET    Take 0.5 mg by mouth. Take one tablet twice daily as needed for anxiety   LORAZEPAM (ATIVAN) 0.5 MG TABLET    Take 1 tablet (0.5 mg total) by mouth every morning.   MAGNESIUM HYDROXIDE (MILK OF MAGNESIA) 400 MG/5ML SUSPENSION    Take 30 mLs by mouth daily as needed for mild constipation.   METOPROLOL TARTRATE (LOPRESSOR) 25 MG TABLET    Take 3 tablets (75 mg total) by mouth 2 (two) times daily.   MIRTAZAPINE (REMERON) 7.5 MG TABLET    Take 7.5 mg by mouth. Take  daily at bedtime   OMEPRAZOLE (PRILOSEC) 20 MG CAPSULE    Take 1 capsule by mouth every morning.   ONDANSETRON (ZOFRAN) 4 MG TABLET    Take 4 mg by mouth. Take one every 6 hours as needed for nausea or vomiting   POLYETHYLENE GLYCOL (MIRALAX / GLYCOLAX) PACKET     Take 17 g by mouth daily as needed for moderate constipation.   VITAMIN B-12 (CYANOCOBALAMIN) 500 MCG TABLET    Take 500 mcg by mouth every morning.  Modified Medications   No medications on file  Discontinued Medications   HYDROCODONE-ACETAMINOPHEN (NORCO) 7.5-325 MG PER TABLET    Take 1 tablet by mouth every 6 (six) hours as needed for moderate pain.    Physical Exam: Filed Vitals:   01/05/15 1625  BP: 200/92  Pulse: 68  Temp: 98.2 F (36.8 C)  TempSrc: Oral  Weight: 93 lb (42.185 kg)  SpO2: 99%   Body mass index is 15.48 kg/(m^2).  Physical Exam  Constitutional: She is oriented to person, place, and time. She  appears well-developed and well-nourished. No distress.  HENT:  Head: Normocephalic and atraumatic.  Mouth/Throat: No oropharyngeal exudate.  Eyes: Conjunctivae and EOM are normal. Pupils are equal, round, and reactive to light. Right eye exhibits no discharge. Left eye exhibits no discharge.  Neck: Normal range of motion. Neck supple. No JVD present. No thyromegaly present.  Cardiovascular: Normal rate, regular rhythm and normal heart sounds.   No murmur heard. Pulmonary/Chest: Effort normal and breath sounds normal. No respiratory distress. She has no wheezes. She has no rales.  Abdominal: Soft. Bowel sounds are normal. She exhibits no distension. There is no tenderness.  Right inguinal hernia  Musculoskeletal: She exhibits tenderness (lower back). She exhibits no edema.  Back pain is controlled.   Neurological: She is alert and oriented to person, place, and time. She has normal reflexes. No cranial nerve deficit. She exhibits normal muscle tone. Coordination normal.  Skin: Skin is warm and dry. No rash noted. She is not diaphoretic. No erythema.  Psychiatric: She has a normal mood and affect. Her behavior is normal. Thought content normal. Her mood appears not anxious. Her affect is not angry, not blunt, not labile and not inappropriate. Her speech is not rapid  and/or pressured, not delayed, not tangential and not slurred. She is not agitated, not aggressive, not hyperactive, not withdrawn, not actively hallucinating and not combative. Thought content is not paranoid and not delusional. Cognition and memory are impaired. She does not exhibit a depressed mood. She is communicative. She exhibits abnormal recent memory.    Labs reviewed: Basic Metabolic Panel:  Recent Labs  16/10/96 0400 06/09/14 0354 06/10/14 0645  06/15/14 06/20/14 07/19/14  NA 135 134* 137  < > 140 137 136*  K 4.0 3.4* 4.3  < > 5.5* 4.7 4.2  CL 100 100 102  --   --   --   --   CO2 25 25 27   --   --   --   --   GLUCOSE 89 93 118*  --   --   --   --   BUN 29* 21 21  < > 42* 41* 26*  CREATININE 1.00 0.91 0.99  < > 1.7* 1.5* 1.1  CALCIUM 8.6 8.3* 8.8  --   --   --   --   < > = values in this interval not displayed.  Liver Function Tests:  Recent Labs  06/01/14 0510  06/07/14 1651 06/08/14 0400 06/13/14 07/19/14  AST 18  < > 24 21 17 18   ALT 9  < > 15 12 10 9   ALKPHOS 78  < > 134* 115 138* 87  BILITOT 0.9  --  0.7 0.6  --   --   PROT 6.2  --  6.9 6.2  --   --   ALBUMIN 3.2*  --  3.6 3.2*  --   --   < > = values in this interval not displayed.  CBC:  Recent Labs  06/01/14 0510  06/07/14 1651  06/08/14 0927 06/09/14 0354 06/10/14 0645  WBC 7.5  < > 10.9*  < > 8.0 7.8 11.6*  NEUTROABS 5.0  --  8.2*  --  5.4  --   --   HGB 10.9*  < > 12.7  < > 10.9* 11.7* 12.3  HCT 33.1*  < > 38.1  < > 33.4* 35.8* 36.9  MCV 97.6  --  95.3  < > 96.0 96.2 96.6  PLT 241  < > 240  < >  215 216 268  < > = values in this interval not displayed.  Lab Results  Component Value Date   TSH 1.457 05/31/2014   Lab Results  Component Value Date   HGBA1C  07/27/2010    5.6 (NOTE)                                                                       According to the ADA Clinical Practice Recommendations for 2011, when HbA1c is used as a screening test:   >=6.5%   Diagnostic of Diabetes  [place]           (if abnormal result  is confirmed)  5.7-6.4%   Increased risk of developing Diabetes Mellitus  References:Diagnosis and Classification of Diabetes Mellitus,Diabetes Care,2011,34(Suppl 1):S62-S69 and Standards of Medical Care in         Diabetes - 2011,Diabetes Care,2011,34  (Suppl 1):S11-S61.   Lab Results  Component Value Date   CHOL  07/28/2010    111        ATP III CLASSIFICATION:  <200     mg/dL   Desirable  161-096  mg/dL   Borderline High  >=045    mg/dL   High          HDL 29* 07/28/2010   LDLCALC  07/28/2010    56        Total Cholesterol/HDL:CHD Risk Coronary Heart Disease Risk Table                     Men   Women  1/2 Average Risk   3.4   3.3  Average Risk       5.0   4.4  2 X Average Risk   9.6   7.1  3 X Average Risk  23.4   11.0        Use the calculated Patient Ratio above and the CHD Risk Table to determine the patient's CHD Risk.        ATP III CLASSIFICATION (LDL):  <100     mg/dL   Optimal  409-811  mg/dL   Near or Above                    Optimal  130-159  mg/dL   Borderline  914-782  mg/dL   High  >956     mg/dL   Very High   TRIG 213 07/28/2010   CHOLHDL 3.8 07/28/2010    Significant Diagnostic Results since last visit: none  Patient Care Team: Kimber Relic, MD as PCP - General (Internal Medicine) Kimber Relic, MD (Internal Medicine)  Assessment/Plan Problem List Items Addressed This Visit    Subacute confusional state    Staff reported the patient was noted to have increased confusion in the past a few days, denied nausea, vomiting, constipation, or abd pain. Denied dysuria or focal weakness, dizziness, or change in vision. Will up date CBC and UA C/S      Renal insufficiency (Chronic)    06/15/14 Bun/creat 42/1.68 07/19/14 Bun/creat 26/1.11 01/05/15 schedule CMP      Hypothyroidism (Chronic)    Not on thyroid med, update TSH      Hyponatremia (Chronic)    Last Na 136 07/19/14,  update CMP      Hx of pancreatitis      Update Amylase and Lipase      GERD (gastroesophageal reflux disease)    Stable, no change of tx      Essential hypertension - Primary (Chronic)    Chronic elevated SBP, continue Clonidine 0.1mg  daily, Hydralazine  bid, Metoprolol  bid. Prn Clonidine for Bp >180/90      Depression    Stable, continue Mirtazapine  since 07/14/14      Constipation    Stable, continue her MiraLAX daily and prn MOM      A-fib (Chronic)    Heart rate is in control. Continue Digoxin 07/19/14 digoxin level 1.0          Family/ staff Communication: continue to monitor the patient.   Labs/tests ordered: CBC, CMP, Lipase, Amylase, TSH  ManXie Mast NP Geriatrics [facility] [address], [place] [address] On Call:  [phone] & follow prompts after 5pm & weekends Office Phone:  [phone] Office Fax:  [phone]

## 2015-01-05 NOTE — Assessment & Plan Note (Signed)
Stable, continue Mirtazapine  since 07/14/14

## 2015-01-05 NOTE — Assessment & Plan Note (Signed)
Stable, no change of tx 

## 2015-01-05 NOTE — Assessment & Plan Note (Signed)
Not on thyroid med, update TSH

## 2015-01-05 NOTE — Assessment & Plan Note (Signed)
Staff reported the patient was noted to have increased confusion in the past a few days, denied nausea, vomiting, constipation, or abd pain. Denied dysuria or focal weakness, dizziness, or change in vision. Will up date CBC and UA C/S

## 2015-01-05 NOTE — Assessment & Plan Note (Signed)
Heart rate is in control. Continue Digoxin 07/19/14 digoxin level 1.0 

## 2015-01-05 NOTE — Assessment & Plan Note (Signed)
Last Na 136 07/19/14, update CMP

## 2015-01-05 NOTE — Assessment & Plan Note (Addendum)
Chronic elevated SBP, continue Clonidine 0.1mg daily, Hydralazine 50mg bid, Metoprolol 75mg bid. Prn Clonidine for Bp >180/90 

## 2015-01-05 NOTE — Assessment & Plan Note (Signed)
Stable, continue her MiraLAX daily and prn MOM

## 2015-01-05 NOTE — Assessment & Plan Note (Signed)
Update Amylase and Lipase

## 2015-01-07 LAB — BASIC METABOLIC PANEL
BUN: 40 mg/dL — AB (ref 4–21)
Creatinine: 1.4 mg/dL — AB (ref 0.5–1.1)
Glucose: 105 mg/dL
Potassium: 4.6 mmol/L (ref 3.4–5.3)
Sodium: 138 mmol/L (ref 137–147)

## 2015-01-07 LAB — CBC AND DIFFERENTIAL
HEMATOCRIT: 33 % — AB (ref 36–46)
Hemoglobin: 10.2 g/dL — AB (ref 12.0–16.0)
Platelets: 219 10*3/uL (ref 150–399)
WBC: 6.7 10^3/mL

## 2015-01-07 LAB — HEPATIC FUNCTION PANEL
ALK PHOS: 75 U/L (ref 25–125)
ALT: 10 U/L (ref 7–35)
AST: 18 U/L (ref 13–35)
BILIRUBIN, TOTAL: 0.3 mg/dL

## 2015-01-07 LAB — TSH: TSH: 2.57 u[IU]/mL (ref 0.41–5.90)

## 2015-01-12 ENCOUNTER — Encounter: Payer: Self-pay | Admitting: Nurse Practitioner

## 2015-01-12 ENCOUNTER — Other Ambulatory Visit: Payer: Self-pay | Admitting: Nurse Practitioner

## 2015-01-12 DIAGNOSIS — N390 Urinary tract infection, site not specified: Secondary | ICD-10-CM | POA: Insufficient documentation

## 2015-01-17 ENCOUNTER — Emergency Department (HOSPITAL_COMMUNITY): Payer: Medicare Other

## 2015-01-17 ENCOUNTER — Emergency Department (HOSPITAL_COMMUNITY)
Admission: EM | Admit: 2015-01-17 | Discharge: 2015-01-17 | Disposition: A | Discharge status: Home / Self Care | Payer: Medicare Other | Attending: Emergency Medicine | Admitting: Emergency Medicine

## 2015-01-17 ENCOUNTER — Encounter (HOSPITAL_COMMUNITY): Payer: Self-pay | Admitting: *Deleted

## 2015-01-17 DIAGNOSIS — Z862 Personal history of diseases of the blood and blood-forming organs and certain disorders involving the immune mechanism: Secondary | ICD-10-CM | POA: Insufficient documentation

## 2015-01-17 DIAGNOSIS — Z79899 Other long term (current) drug therapy: Secondary | ICD-10-CM | POA: Diagnosis not present

## 2015-01-17 DIAGNOSIS — F419 Anxiety disorder, unspecified: Secondary | ICD-10-CM | POA: Insufficient documentation

## 2015-01-17 DIAGNOSIS — Y9389 Activity, other specified: Secondary | ICD-10-CM | POA: Insufficient documentation

## 2015-01-17 DIAGNOSIS — Z8639 Personal history of other endocrine, nutritional and metabolic disease: Secondary | ICD-10-CM | POA: Diagnosis not present

## 2015-01-17 DIAGNOSIS — F329 Major depressive disorder, single episode, unspecified: Secondary | ICD-10-CM | POA: Diagnosis not present

## 2015-01-17 DIAGNOSIS — N183 Chronic kidney disease, stage 3 (moderate): Secondary | ICD-10-CM | POA: Insufficient documentation

## 2015-01-17 DIAGNOSIS — K5900 Constipation, unspecified: Secondary | ICD-10-CM | POA: Diagnosis not present

## 2015-01-17 DIAGNOSIS — I129 Hypertensive chronic kidney disease with stage 1 through stage 4 chronic kidney disease, or unspecified chronic kidney disease: Secondary | ICD-10-CM | POA: Diagnosis not present

## 2015-01-17 DIAGNOSIS — K219 Gastro-esophageal reflux disease without esophagitis: Secondary | ICD-10-CM | POA: Diagnosis not present

## 2015-01-17 DIAGNOSIS — Y998 Other external cause status: Secondary | ICD-10-CM | POA: Diagnosis not present

## 2015-01-17 DIAGNOSIS — S32592A Other specified fracture of left pubis, initial encounter for closed fracture: Secondary | ICD-10-CM | POA: Diagnosis not present

## 2015-01-17 DIAGNOSIS — X58XXXA Exposure to other specified factors, initial encounter: Secondary | ICD-10-CM | POA: Diagnosis not present

## 2015-01-17 DIAGNOSIS — Y9289 Other specified places as the place of occurrence of the external cause: Secondary | ICD-10-CM | POA: Diagnosis not present

## 2015-01-17 DIAGNOSIS — Z87448 Personal history of other diseases of urinary system: Secondary | ICD-10-CM | POA: Insufficient documentation

## 2015-01-17 DIAGNOSIS — S79912A Unspecified injury of left hip, initial encounter: Secondary | ICD-10-CM | POA: Diagnosis present

## 2015-01-17 MED ORDER — TRAMADOL HCL 50 MG PO TABS: 50.0000 mg | ORAL_TABLET | Freq: Four times a day (QID) | ORAL | Status: AC | PRN

## 2015-01-17 NOTE — ED Notes (Signed)
Bed: WA04 Expected date:  Expected time:  Means of arrival:  Comments: Elderly fall-ems

## 2015-01-17 NOTE — ED Provider Notes (Signed)
CSN: [id number]     Arrival date & time 01/17/15  1258 History   First MD Initiated Contact with Patient 01/17/15 1315     Chief Complaint  Patient presents with  . Hip Injury      HPI Patient reports falling today she had imaging completed at her facility which demonstrated no fracture however she's continued to have pain and difficulty walking.  She was brought to the ER for evaluation.  Denies head injury.  Denies discomfort in her upper extremities.   Past Medical History  Diagnosis Date  . Anxiety   . GERD (gastroesophageal reflux disease)   . Anemia   . Hyperlipidemia   . Back pain   . Hypertension   . Hypothyroidism   . Atrial fibrillation (HCC)   . Hyponatremia     hx of  . Syncope     hx syncope and collapse per paper from [facility]  . Unconscious (HCC) 04/06/12    per [facility] paper -hx unconscious episodes -pt asked not to be sent to hospital for these  . Fx lumbar vertebra-closed (HCC)     [facility] of [facility]  . Lower back pain     hx of  . Renal insufficiency 06/07/2014  . A-fib (HCC) 12/16/2012    12/28/13 Digoxin level 0.5   . Abdominal pain 05/31/2014  . CKD (chronic kidney disease) stage 3, GFR 30-59 ml/min 05/31/2014  . Compression fracture of L2 lumbar vertebra with routine healing 06/02/2014     Most likely due to thoracic compression fractures, she does have mild compression fracture of T11 and L2. - Discussed the case with neurosurgery Dr. Lovell Sheehan and he recommended narcotics and a brace - Physical therapy evaluated the patient and recommended home PT - Her pain was controlled with Vicodin and fentanyl.    . Compression fracture of thoracic vertebra, non-traumatic (HCC) 06/02/2014  . Constipation 12/16/2012  . Depression 08/19/2012    Mirtazapine 7.5mg  nightly helped.    Marland Kitchen Hx of pancreatitis 04/06/2012    10/12/13 Amylase 50(0-105) Lipase 11(0-75) 11/23/13 Amylase 39. Lipase 14.    . Protein-calorie malnutrition, severe (HCC) 06/08/2014    Past Surgical History  Procedure Laterality Date  . Abdominal hysterectomy    . Cataract extraction w/ intraocular lens  implant, bilateral    . Abdominal hysterectomy  1987  . Tonsillectomy and adenoidectomy    . Breast lumpectomy    . Colonoscopy  2003    Dr. Randa Evens   Family History  Problem Relation Age of Onset  . Bleeding Disorder Father     "died of blood poisoning"   Social History  Substance Use Topics  . Smoking status: Never Smoker   . Smokeless tobacco: Never Used  . Alcohol Use: No   OB History    Gravida Para Term Preterm AB TAB SAB Ectopic Multiple Living   0 0 0 0 0 0 0 0       Review of Systems  All other systems reviewed and are negative.     Allergies  Codeine and Codeine  Home Medications   Prior to Admission medications   Medication Sig Start Date End Date Taking? Authorizing Provider  acetaminophen (TYLENOL) 325 MG tablet Take 650 mg by mouth every 4 (four) hours as needed for mild pain or fever.   Yes Historical Provider, MD  brimonidine (ALPHAGAN) 0.2 % ophthalmic solution Place 1 drop into both eyes 2 (two) times daily.   Yes Historical Provider,  MD  Cholecalciferol (VITAMIN D) 2000 UNITS tablet Take 2,000 Units by mouth every morning.   Yes Historical Provider, MD  cloNIDine (CATAPRES) 0.1 MG tablet Take 0.1 mg by mouth every 8 (eight) hours as needed (SBP >180 DBP >90.).   Yes Historical Provider, MD  cloNIDine (CATAPRES) 0.1 MG tablet Take 0.1 mg by mouth daily.   Yes Historical Provider, MD  digoxin (LANOXIN) 0.125 MG tablet Take 0.0625 mg by mouth daily. Take 1/2 tablet every morning * check pulse daily*   Yes Historical Provider, MD  famotidine (PEPCID) 20 MG tablet Take 20 mg by mouth at bedtime.  12/26/14  Yes Historical Provider, MD  fentaNYL (DURAGESIC) 25 MCG/HR patch Apply 1 patch topically every 3 days, chart site, remove old patch before applying new patch 12/09/14  Yes Kirt Boys, DO  hydrALAZINE (APRESOLINE) 50 MG tablet  Take 50 mg by mouth 2 (two) times daily.   Yes Historical Provider, MD  LORazepam (ATIVAN) 0.5 MG tablet Take 0.5 mg by mouth. Take one tablet twice daily as needed for anxiety   Yes Historical Provider, MD  LORazepam (ATIVAN) 0.5 MG tablet Take 1 tablet (0.5 mg total) by mouth every morning. 06/10/14  Yes Costin Otelia Sergeant, MD  magnesium hydroxide (MILK OF MAGNESIA) 400 MG/5ML suspension Take 30 mLs by mouth daily as needed for mild constipation.   Yes Historical Provider, MD  metoprolol tartrate (LOPRESSOR) 25 MG tablet Take 3 tablets (75 mg total) by mouth 2 (two) times daily. 04/11/12  Yes Kathlen Mody, MD  mirtazapine (REMERON) 15 MG tablet Take 15 mg by mouth at bedtime. 12/20/14  Yes Historical Provider, MD  ondansetron (ZOFRAN) 4 MG tablet Take 4 mg by mouth. Take one every 6 hours as needed for nausea or vomiting   Yes Historical Provider, MD  polyethylene glycol (MIRALAX / GLYCOLAX) packet Take 17 g by mouth daily. And daily PRN constipation (standing order).   Yes Historical Provider, MD  protein supplement (PROMOD) POWD Take 1 scoop by mouth daily. In 120 cc of either Boost, Resource 2.0,  Or any Breeze drink every day per resident preference.   Yes Historical Provider, MD  vitamin B-12 (CYANOCOBALAMIN) 500 MCG tablet Take 500 mcg by mouth every morning.   Yes Historical Provider, MD  sulfamethoxazole-trimethoprim (BACTRIM DS,SEPTRA DS) 800-160 MG tablet Take by mouth 2 (two) times daily. For 7 days starting 01/09/15. 01/09/15   Historical Provider, MD   BP 218/54 mmHg  Pulse 60  Temp(Src) 98.3 F (36.8 C) (Oral)  Resp 15  SpO2 94% Physical Exam  Constitutional: She is oriented to person, place, and time. She appears well-developed and well-nourished. No distress.  HENT:  Head: Normocephalic and atraumatic.  Eyes: EOM are normal.  Neck: Normal range of motion.  Cardiovascular: Normal rate, regular rhythm and normal heart sounds.   Pulmonary/Chest: Effort normal and breath sounds  normal.  Abdominal: Soft. She exhibits no distension. There is no tenderness.  Musculoskeletal: Normal range of motion.  Pain with range of motion of left hip.  No obvious deformity of the left lower extremity as compared to right.  Mild tenderness of the left pubic rami  Neurological: She is alert and oriented to person, place, and time.  Skin: Skin is warm and dry.  Psychiatric: She has a normal mood and affect. Judgment normal.  Nursing note and vitals reviewed.   ED Course  Procedures (including critical care time) Labs Review Labs Reviewed - No data to display  Imaging Review Dg  Hip Unilat With Pelvis 2-3 Views Left  01/17/2015   CLINICAL DATA:  Status post fall, left hip pain  EXAM: DG HIP (WITH OR WITHOUT PELVIS) 2-3V LEFT  COMPARISON:  None.  FINDINGS: There is generalized osteopenia. There is no acute hip fracture or dislocation. There is a small cortical lucency involving the left inferior pubic ramus concerning for nondisplaced fracture. There is no aggressive lytic or sclerotic osseous lesion. There is peripheral vascular atherosclerotic disease.  IMPRESSION: 1. No acute hip fracture or dislocation. Given the patient's age and osteopenia, if there is persistent clinical concern for an occult hip fracture, a MRI of the hip is recommended for increased sensitivity. 2. Small cortical lucency involving the left inferior pubic ramus concerning for a nondisplaced fracture.   Electronically Signed   By: Elige Ko   On: 01/17/2015 15:31   I have personally reviewed and evaluated these images and lab results as part of my medical decision-making.   EKG Interpretation None      MDM   Final diagnoses:  None    CT imaging to evaluate for occult left hip fracture.  I suspect that much of her pain is more likely related to pubic rami fractures.  This will be better defined on CT imaging.    Azalia Bilis, MD 01/17/15 [phone]

## 2015-01-17 NOTE — ED Notes (Signed)
Patient transported to CT 

## 2015-01-17 NOTE — ED Notes (Signed)
Patient returned from CT

## 2015-01-17 NOTE — Discharge Instructions (Signed)
Simple Pelvic Fracture, Adult °A pelvic fracture is a break in one of the pelvic bones. The pelvic bones include the bones that you sit on and the bones that make up the lower part of your spine. A pelvic fracture is called simple if the broken bones are stable and are not moving out of place. °CAUSES  °Common causes of this type of fracture include: °· A fall. °· A car accident. °· Force or pressure applied to the pelvis. °RISK FACTORS °You may be at higher risk for this type of fracture if: °· You play high-impact sports. °· You are an older person with a condition that causes weak bones (osteoporosis). °· You have a bone-weakening disease. °SIGNS AND SYMPTOMS °Signs and symptoms may include: °· Tenderness, swelling, or bruising in the affected area. °· Pain when moving the hip. °· Pain when walking or standing. °DIAGNOSIS °A diagnosis is made with a physical exam and X-rays. Sometimes, a CT scan is also done. °TREATMENT °The goal of treatment is to get the bones to heal in a good position. Treatment of a simple pelvic fracture usually involves staying in bed (bed rest) and using crutches or a walker until the bones heal. Medicines may be prescribed for pain. Medicines may also be prescribed that help to prevent blood clots from forming in the legs. °HOME CARE INSTRUCTIONS °Managing Pain, Stiffness, and Swelling °· If directed, apply ice to the injured area: °¨ Put ice in a plastic bag. °¨ Place a towel between your skin and the bag. °¨ Leave the ice on for 20 minutes, 2-3 times a day. °· Raise the injured area above the level of your heart while you are sitting or lying down. °Driving °· Do not  drive or operate heavy machinery until your health care provider tells you it is safe to do. °Activity °· Stay on bed rest for as long as directed by your health care provider. °· While on bed rest: °¨ Change the position of your legs every 1-2 hours. This keeps blood moving well through both of your legs. °¨ You may sit  for as long as you feel comfortable. °· After bed rest: °¨ Avoid strenuous activities for as long as directed by your health care provider. °¨ Return to your normal activities as directed by your health care provider. Ask your health care provider what activities are safe for you. °Safety °· Do not use the injured limb to support your body weight until your health care provider says that you can. Use crutches or a walker as directed by your health care provider. °General Instructions °· Do not use any tobacco products, including cigarettes, chewing tobacco, or electronic cigarettes. Tobacco can delay bone healing. If you need help quitting, ask your health care provider. °· Take medicines only as directed by your health care provider. °· Keep all follow-up visits as directed by your health care provider. This is important. °SEEK MEDICAL CARE IF: °· Your pain gets worse. °· Your pain is not relieved with medicines. °SEEK IMMEDIATE MEDICAL CARE IF: °· You feel light-headed or faint. °· You develop chest pain. °· You develop shortness of breath. °· You have a fever. °· You have blood in your urine or your stools. °· You have vaginal bleeding. °· You have difficulty or pain with urination or with a bowel movement. °· You have difficulty or increased pain with walking. °· You have new or increased swelling in one of your legs. °· You have numbness in your   legs or groin area. °  °This information is not intended to replace advice given to you by your health care provider. Make sure you discuss any questions you have with your health care provider. °  °Document Released: 06/10/2001 Document Revised: 04/22/2014 Document Reviewed: 11/23/2013 °Elsevier Interactive Patient Education ©2016 [employer] ° °

## 2015-01-17 NOTE — ED Notes (Addendum)
Pt fell on 01/10/18 hurt left hip - xrays were done and no fracture was seen; pt has had continued pain and difficulty ambulating. Xray performed yesterday 01/16/15 found fx femur neck .  Pt not on blood thinners. A&O.  Pt is NCB

## 2015-01-17 NOTE — ED Provider Notes (Signed)
  Physical Exam  BP 225/80 mmHg  Pulse 65  Temp(Src) 97.8 F (36.6 C) (Oral)  Resp 16  SpO2 94%  Physical Exam  ED Course  Procedures  MDM  CT scan shows pubic rami fractures. Has Walker and canes at home. States she thinks she'll be  Able manage the pain and she has aides at home. Will discharge.      Benjiman Core, MD 01/17/15 1740

## 2015-01-19 ENCOUNTER — Non-Acute Institutional Stay: Payer: Medicare Other | Admitting: Nurse Practitioner

## 2015-01-19 DIAGNOSIS — K219 Gastro-esophageal reflux disease without esophagitis: Secondary | ICD-10-CM | POA: Diagnosis not present

## 2015-01-19 DIAGNOSIS — I10 Essential (primary) hypertension: Secondary | ICD-10-CM

## 2015-01-19 DIAGNOSIS — S329XXD Fracture of unspecified parts of lumbosacral spine and pelvis, subsequent encounter for fracture with routine healing: Secondary | ICD-10-CM

## 2015-01-19 DIAGNOSIS — M5440 Lumbago with sciatica, unspecified side: Secondary | ICD-10-CM | POA: Diagnosis not present

## 2015-01-19 DIAGNOSIS — I482 Chronic atrial fibrillation: Secondary | ICD-10-CM | POA: Diagnosis not present

## 2015-01-19 DIAGNOSIS — I4821 Permanent atrial fibrillation: Secondary | ICD-10-CM

## 2015-01-19 DIAGNOSIS — F32A Depression, unspecified: Secondary | ICD-10-CM

## 2015-01-19 DIAGNOSIS — K5900 Constipation, unspecified: Secondary | ICD-10-CM

## 2015-01-19 DIAGNOSIS — F329 Major depressive disorder, single episode, unspecified: Secondary | ICD-10-CM | POA: Diagnosis not present

## 2015-01-19 NOTE — Progress Notes (Signed)
Patient ID: [name], female   DOB: Nov 13, 1918, 81 y.o.   MRN: [id number]  Location:  [facility] Provider:  Chipper Oman NP  Code Status:  DNR Goals of care: Advanced Directive information    Chief Complaint  Patient presents with  . Medical Management of Chronic Issues  . Acute Visit    left hip pain     HPI: Patient is a 81 y.o. female seen in the [facility] at [facility] today for evaluation of left hip pain sustained from a fall in her room. Pain worsens with weight bearing, no shortening of the left leg, no noted swelling or localized warmth or erythema/bruise. X-ray left hip/pelvis 01/16/15 [facility] showed liner nondisplaced fx the neck of the femur. ED eval 01/17/15 CT showed IMPRESSION: Fractures of the left inferior and superior pubic rami. No acute abnormality of the proximal femur.  Review of Systems:  Review of Systems  Constitutional: Negative for fever, chills and diaphoresis.  HENT: Positive for hearing loss. Negative for congestion and ear pain.   Eyes: Negative for pain, discharge and redness.  Respiratory: Negative for cough, shortness of breath, wheezing and stridor.   Cardiovascular: Negative for chest pain, palpitations and leg swelling.  Gastrointestinal: Negative for nausea, vomiting, abdominal pain, diarrhea and constipation.       Said better since she takes medicine with food.   Genitourinary: Positive for frequency. Negative for dysuria and urgency.  Musculoskeletal: Positive for back pain, joint pain and falls. Negative for myalgias and neck pain.       New onset left hip/pelvis pain with weight bearing sustained from the fall  Skin: Negative for rash.  Neurological: Positive for weakness. Negative for dizziness, tremors, seizures and headaches.  Psychiatric/Behavioral: Positive for memory loss. Negative for suicidal ideas and hallucinations. The patient is nervous/anxious.        Staff reported the patient has increased confusion in the past a 2-3 days      Past Medical History  Diagnosis Date  . Anxiety   . GERD (gastroesophageal reflux disease)   . Anemia   . Hyperlipidemia   . Back pain   . Hypertension   . Hypothyroidism   . Atrial fibrillation (HCC)   . Hyponatremia     hx of  . Syncope     hx syncope and collapse per paper from [facility]  . Unconscious (HCC) 04/06/12    per [facility] paper -hx unconscious episodes -pt asked not to be sent to hospital for these  . Fx lumbar vertebra-closed (HCC)     hx of per [facility]  . Lower back pain     hx of  . Renal insufficiency 06/07/2014  . A-fib (HCC) 12/16/2012    12/28/13 Digoxin level 0.5   . Abdominal pain 05/31/2014  . CKD (chronic kidney disease) stage 3, GFR 30-59 ml/min 05/31/2014  . Compression fracture of L2 lumbar vertebra with routine healing 06/02/2014     Most likely due to thoracic compression fractures, she does have mild compression fracture of T11 and L2. - Discussed the case with neurosurgery Dr. Lovell Sheehan and he recommended narcotics and a brace - Physical therapy evaluated the patient and recommended home PT - Her pain was controlled with Vicodin and fentanyl.    . Compression fracture of thoracic vertebra, non-traumatic (HCC) 06/02/2014  . Constipation 12/16/2012  . Depression 08/19/2012    Mirtazapine 7.5mg  nightly helped.    Marland Kitchen Hx of pancreatitis 04/06/2012  10/12/13 Amylase 50(0-105) Lipase 11(0-75) 11/23/13 Amylase 39. Lipase 14.    . Protein-calorie malnutrition, severe (HCC) 06/08/2014    Patient Active Problem List   Diagnosis Date Noted  . Fracture of left pelvis (HCC) 01/26/2015  . Infection of urinary tract 01/12/2015  . Subacute confusional state 01/05/2015  . Nausea without vomiting 07/14/2014  . Hyperkalemia 06/15/2014  . Protein-calorie malnutrition, severe (HCC) 06/08/2014  . Renal insufficiency 06/07/2014  . Compression fracture of thoracic vertebra, non-traumatic (HCC) 06/02/2014  . Compression fracture of L2 lumbar  vertebra with routine healing 06/02/2014  . Abdominal pain 05/31/2014  . CKD (chronic kidney disease) stage 3, GFR 30-59 ml/min 05/31/2014  . Hypothyroidism   . Constipation 12/16/2012  . A-fib (HCC) 12/16/2012  . Hyponatremia 08/19/2012  . GERD (gastroesophageal reflux disease) 08/19/2012  . Back pain 08/19/2012  . Depression 08/19/2012  . Essential hypertension 04/10/2012  . Abnormal ECG 04/10/2012  . Hx of pancreatitis 04/06/2012    Allergies  Allergen Reactions  . Codeine Other (See Comments)    Unknown reaction. Info from a MAR.  Marland Kitchen Codeine     Unknown.    Medications: Patient's Medications  New Prescriptions   No medications on file  Previous Medications   ACETAMINOPHEN (TYLENOL) 325 MG TABLET    Take 650 mg by mouth every 4 (four) hours as needed for mild pain or fever.   BRIMONIDINE (ALPHAGAN) 0.2 % OPHTHALMIC SOLUTION    Place 1 drop into both eyes 2 (two) times daily.   CHOLECALCIFEROL (VITAMIN D) 2000 UNITS TABLET    Take 2,000 Units by mouth every morning.   CLONIDINE (CATAPRES) 0.1 MG TABLET    Take 0.1 mg by mouth every 8 (eight) hours as needed (SBP >180 DBP >90.).   CLONIDINE (CATAPRES) 0.1 MG TABLET    Take 0.1 mg by mouth daily.   DIGOXIN (LANOXIN) 0.125 MG TABLET    Take 0.0625 mg by mouth daily. Take 1/2 tablet every morning * check pulse daily*   FAMOTIDINE (PEPCID) 20 MG TABLET    Take 20 mg by mouth at bedtime.    FENTANYL (DURAGESIC) 25 MCG/HR PATCH    Apply 1 patch topically every 3 days, chart site, remove old patch before applying new patch   HYDRALAZINE (APRESOLINE) 50 MG TABLET    Take 50 mg by mouth 2 (two) times daily.   LORAZEPAM (ATIVAN) 0.5 MG TABLET    Take 0.5 mg by mouth. Take one tablet twice daily as needed for anxiety   LORAZEPAM (ATIVAN) 0.5 MG TABLET    Take 1 tablet (0.5 mg total) by mouth every morning.   MAGNESIUM HYDROXIDE (MILK OF MAGNESIA) 400 MG/5ML SUSPENSION    Take 30 mLs by mouth daily as needed for mild constipation.    METOPROLOL TARTRATE (LOPRESSOR) 25 MG TABLET    Take 3 tablets (75 mg total) by mouth 2 (two) times daily.   MIRTAZAPINE (REMERON) 15 MG TABLET    Take 15 mg by mouth at bedtime.   ONDANSETRON (ZOFRAN) 4 MG TABLET    Take 4 mg by mouth. Take one every 6 hours as needed for nausea or vomiting   POLYETHYLENE GLYCOL (MIRALAX / GLYCOLAX) PACKET    Take 17 g by mouth daily. And daily PRN constipation (standing order).   PROTEIN SUPPLEMENT (PROMOD) POWD    Take 1 scoop by mouth daily. In 120 cc of either Boost, Resource 2.0,  Or any Breeze drink every day per resident preference.   SULFAMETHOXAZOLE-TRIMETHOPRIM (BACTRIM  DS,SEPTRA DS) 800-160 MG TABLET    Take by mouth 2 (two) times daily. For 7 days starting 01/09/15.   TRAMADOL (ULTRAM) 50 MG TABLET    Take 1 tablet (50 mg total) by mouth every 6 (six) hours as needed.   VITAMIN B-12 (CYANOCOBALAMIN) 500 MCG TABLET    Take 500 mcg by mouth every morning.  Modified Medications   No medications on file  Discontinued Medications   No medications on file    Physical Exam: Filed Vitals:   01/19/15 1034  BP: 148/82  Pulse: 67  Temp: 98.1 F (36.7 C)  TempSrc: Tympanic  Resp: 12   There is no weight on file to calculate BMI.  Physical Exam  Constitutional: She is oriented to person, place, and time. She appears well-developed and well-nourished. No distress.  HENT:  Head: Normocephalic and atraumatic.  Mouth/Throat: No oropharyngeal exudate.  Eyes: Conjunctivae and EOM are normal. Pupils are equal, round, and reactive to light. Right eye exhibits no discharge. Left eye exhibits no discharge.  Neck: Normal range of motion. Neck supple. No JVD present. No thyromegaly present.  Cardiovascular: Normal rate, regular rhythm and normal heart sounds.   No murmur heard. Pulmonary/Chest: Effort normal and breath sounds normal. No respiratory distress. She has no wheezes. She has no rales.  Abdominal: Soft. Bowel sounds are normal. She exhibits no  distension. There is no tenderness.  Right inguinal hernia  Musculoskeletal: She exhibits tenderness (lower back). She exhibits no edema.  Left hip/pelvis pain with weight bearing since the fall 01/16/15  Neurological: She is alert and oriented to person, place, and time. She has normal reflexes. No cranial nerve deficit. She exhibits normal muscle tone. Coordination normal.  Skin: Skin is warm and dry. No rash noted. She is not diaphoretic. No erythema.  Psychiatric: She has a normal mood and affect. Her behavior is normal. Thought content normal. Her mood appears not anxious. Her affect is not angry, not blunt, not labile and not inappropriate. Her speech is not rapid and/or pressured, not delayed, not tangential and not slurred. She is not agitated, not aggressive, not hyperactive, not withdrawn, not actively hallucinating and not combative. Thought content is not paranoid and not delusional. Cognition and memory are impaired. She does not exhibit a depressed mood. She is communicative. She exhibits abnormal recent memory.    Labs reviewed: Basic Metabolic Panel:  Recent Labs  36/64/40 0400 06/09/14 0354 06/10/14 0645  06/20/14 07/19/14 01/07/15  NA 135 134* 137  < > 137 136* 138  K 4.0 3.4* 4.3  < > 4.7 4.2 4.6  CL 100 100 102  --   --   --   --   CO2 --   --   --   --   GLUCOSE 89 93 118*  --   --   --   --   BUN 29* 21 21  < > 41* 26* 40*  CREATININE 1.00 0.91 0.99  < > 1.5* 1.1 1.4*  CALCIUM 8.6 8.3* 8.8  --   --   --   --   < > = values in this interval not displayed.  Liver Function Tests:  Recent Labs  06/01/14 0510  06/07/14 1651 06/08/14 0400 06/13/14 07/19/14 01/07/15  AST 18  < > ALT 9  < > ALKPHOS 78  < > 134* 115 138* 87 75  BILITOT 0.9  --  0.7 0.6  --   --   --   PROT 6.2  --  6.9 6.2  --   --   --   ALBUMIN 3.2*  --  3.6 3.2*  --   --   --   < > = values in this interval not displayed.  CBC:  Recent Labs   06/01/14 0510  06/07/14 1651  06/08/14 0927 06/09/14 0354 06/10/14 0645 01/07/15  WBC 7.5  < > 10.9*  < > 8.0 7.8 11.6* 6.7  NEUTROABS 5.0  --  8.2*  --  5.4  --   --   --   HGB 10.9*  < > 12.7  < > 10.9* 11.7* 12.3 10.2*  HCT 33.1*  < > 38.1  < > 33.4* 35.8* 36.9 33*  MCV 97.6  --  95.3  < > 96.0 96.2 96.6  --   PLT 241  < > 240  < > 215 216 268 219  < > = values in this interval not displayed.  Lab Results  Component Value Date   TSH 2.57 01/07/2015   Lab Results  Component Value Date   HGBA1C  07/27/2010    5.6 (NOTE)                                                                       According to the ADA Clinical Practice Recommendations for 2011, when HbA1c is used as a screening test:   >=6.5%   Diagnostic of Diabetes Mellitus           (if abnormal result  is confirmed)  5.7-6.4%   Increased risk of developing Diabetes Mellitus  References:Diagnosis and Classification of Diabetes Mellitus,Diabetes Care,2011,34(Suppl 1):S62-S69 and Standards of Medical Care in         Diabetes - 2011,Diabetes Care,2011,34  (Suppl 1):S11-S61.   Lab Results  Component Value Date   CHOL  07/28/2010    111        ATP III CLASSIFICATION:  <200     mg/dL   Desirable  811-914  mg/dL   Borderline High  >=782    mg/dL   High          HDL 29* 07/28/2010   LDLCALC  07/28/2010    56        Total Cholesterol/HDL:CHD Risk Coronary Heart Disease Risk Table                     Men   Women  1/2 Average Risk   3.4   3.3  Average Risk       5.0   4.4  2 X Average Risk   9.6   7.1  3 X Average Risk  23.4   11.0        Use the calculated Patient Ratio above and the CHD Risk Table to determine the patient's CHD Risk.        ATP III CLASSIFICATION (LDL):  <100     mg/dL   Optimal  956-213  mg/dL   Near or Above                    Optimal  130-159  mg/dL   Borderline  086-578  mg/dL   High  >244     mg/dL   Very High   TRIG 010 07/28/2010   CHOLHDL 3.8 07/28/2010    Significant Diagnostic  Results since last visit: left superior and inferior ramus fx.   Patient Care Team: Kimber Relic, MD as PCP - General (Internal Medicine) Kimber Relic, MD (Internal Medicine)  Assessment/Plan Problem List Items Addressed This Visit    GERD (gastroesophageal reflux disease)    Stable, continue Famotidine 20mg  daily.       Fracture of left pelvis (HCC) - Primary    01/27/15 CT pelvis: IMPRESSION: Fractures of the left inferior and superior pubic rami. No acute abnormality of the proximal femur. F/u Ortho. Rehab at [facility]      Essential hypertension (Chronic)    Chronic elevated SBP, continue Clonidine 0.1mg  daily, Hydralazine 50mg  bid, Metoprolol 75mg  bid. Prn Clonidine for Bp >180/90      Depression    Stable, continue Mirtazapine 15mg  since 07/14/14, continue Lorazepam 0.5mg  bid/prn bid.       Constipation    Stable, continue her MiraLAX daily/prn and prn MOM      Back pain    Pain is adequately controlled, continue Fentanyl patch 28mcg/hr q72h. Observe the patient      A-fib (HCC) (Chronic)    Heart rate is in control. Continue Digoxin 07/19/14 digoxin level 1.0          Family/ staff Communication: ED eval if pain persists or worse.   Labs/tests ordered: X-ray L hip and pelvis done 01/16/15  [facility] Niah Heinle NP Geriatrics [facility] [address], [place] [address] On Call:  [phone] & follow prompts after 5pm & weekends Office Phone:  [phone] Office Fax:  [phone]

## 2015-01-26 ENCOUNTER — Encounter: Payer: Self-pay | Admitting: Nurse Practitioner

## 2015-01-26 DIAGNOSIS — S329XXA Fracture of unspecified parts of lumbosacral spine and pelvis, initial encounter for closed fracture: Secondary | ICD-10-CM | POA: Insufficient documentation

## 2015-01-26 NOTE — Assessment & Plan Note (Signed)
Heart rate is in control. Continue Digoxin 07/19/14 digoxin level 1.0

## 2015-01-26 NOTE — Assessment & Plan Note (Signed)
Stable, continue Mirtazapine 15mg  since 07/14/14, continue Lorazepam 0.5mg  bid/prn bid.

## 2015-01-26 NOTE — Assessment & Plan Note (Signed)
01/27/15 CT pelvis: IMPRESSION: Fractures of the left inferior and superior pubic rami. No acute abnormality of the proximal femur. [facility]

## 2015-01-26 NOTE — Assessment & Plan Note (Signed)
Stable, continue Famotidine 20mg daily.   

## 2015-01-26 NOTE — Assessment & Plan Note (Signed)
Chronic elevated SBP, continue Clonidine 0.1mg  daily, Hydralazine 50mg  bid, Metoprolol 75mg  bid. Prn Clonidine for Bp >180/90

## 2015-01-26 NOTE — Assessment & Plan Note (Signed)
Pain is adequately controlled, continue Fentanyl patch 10525mcg/hr q72h. Observe the patient

## 2015-01-26 NOTE — Assessment & Plan Note (Signed)
Stable, continue her MiraLAX daily/prn and prn MOM

## 2015-02-07 ENCOUNTER — Other Ambulatory Visit: Payer: Self-pay | Admitting: *Deleted

## 2015-02-07 MED ORDER — FENTANYL 25 MCG/HR TD PT72: MEDICATED_PATCH | TRANSDERMAL | Status: AC

## 2015-02-07 NOTE — Telephone Encounter (Signed)
[facility] 

## 2015-03-13 ENCOUNTER — Other Ambulatory Visit: Payer: Self-pay | Admitting: *Deleted

## 2015-03-13 MED ORDER — LORAZEPAM 0.5 MG PO TABS: ORAL_TABLET | ORAL | Status: AC

## 2015-03-13 NOTE — Telephone Encounter (Signed)
[facility] 

## 2015-03-29 LAB — BASIC METABOLIC PANEL
BUN: 33 mg/dL — AB (ref 4–21)
Creatinine: 1.4 mg/dL — AB (ref 0.5–1.1)
Glucose: 112 mg/dL
POTASSIUM: 4.1 mmol/L (ref 3.4–5.3)
SODIUM: 141 mmol/L (ref 137–147)

## 2015-03-29 LAB — CBC AND DIFFERENTIAL
HCT: 31 % — AB (ref 36–46)
HEMOGLOBIN: 9.9 g/dL — AB (ref 12.0–16.0)
Platelets: 213 10*3/uL (ref 150–399)
WBC: 6.3 10^3/mL

## 2015-03-29 LAB — HEPATIC FUNCTION PANEL
ALK PHOS: 71 U/L (ref 25–125)
ALT: 6 U/L — AB (ref 7–35)
AST: 13 U/L (ref 13–35)
Bilirubin, Total: 0.3 mg/dL

## 2015-04-13 ENCOUNTER — Non-Acute Institutional Stay: Payer: Medicare Other | Admitting: Nurse Practitioner

## 2015-04-13 ENCOUNTER — Encounter: Payer: Self-pay | Admitting: Nurse Practitioner

## 2015-04-13 VITALS — BP 154/82 | HR 79 | Temp 97.6°F | Ht 65.0 in | Wt 90.8 lb

## 2015-04-13 DIAGNOSIS — F32A Depression, unspecified: Secondary | ICD-10-CM

## 2015-04-13 DIAGNOSIS — E039 Hypothyroidism, unspecified: Secondary | ICD-10-CM | POA: Diagnosis not present

## 2015-04-13 DIAGNOSIS — I10 Essential (primary) hypertension: Secondary | ICD-10-CM

## 2015-04-13 DIAGNOSIS — F329 Major depressive disorder, single episode, unspecified: Secondary | ICD-10-CM

## 2015-04-13 DIAGNOSIS — Z8719 Personal history of other diseases of the digestive system: Secondary | ICD-10-CM | POA: Diagnosis not present

## 2015-04-13 DIAGNOSIS — E875 Hyperkalemia: Secondary | ICD-10-CM | POA: Diagnosis not present

## 2015-04-13 DIAGNOSIS — S329XXS Fracture of unspecified parts of lumbosacral spine and pelvis, sequela: Secondary | ICD-10-CM | POA: Diagnosis not present

## 2015-04-13 DIAGNOSIS — I482 Chronic atrial fibrillation: Secondary | ICD-10-CM | POA: Diagnosis not present

## 2015-04-13 DIAGNOSIS — D638 Anemia in other chronic diseases classified elsewhere: Secondary | ICD-10-CM | POA: Insufficient documentation

## 2015-04-13 DIAGNOSIS — M5440 Lumbago with sciatica, unspecified side: Secondary | ICD-10-CM

## 2015-04-13 DIAGNOSIS — K219 Gastro-esophageal reflux disease without esophagitis: Secondary | ICD-10-CM

## 2015-04-13 DIAGNOSIS — I4821 Permanent atrial fibrillation: Secondary | ICD-10-CM

## 2015-04-13 DIAGNOSIS — E871 Hypo-osmolality and hyponatremia: Secondary | ICD-10-CM | POA: Diagnosis not present

## 2015-04-13 DIAGNOSIS — K5900 Constipation, unspecified: Secondary | ICD-10-CM

## 2015-04-13 DIAGNOSIS — N289 Disorder of kidney and ureter, unspecified: Secondary | ICD-10-CM

## 2015-04-13 NOTE — Assessment & Plan Note (Signed)
Stable, continue her MiraLAX daily/prn and prn MOM 

## 2015-04-13 NOTE — Assessment & Plan Note (Signed)
Chronic elevated SBP, continue Clonidine 0.1mg daily, Hydralazine 50mg bid, Metoprolol 75mg bid. Prn Clonidine for Bp >180/90 

## 2015-04-13 NOTE — Assessment & Plan Note (Signed)
Stable, continue Mirtazapine 15mg since 07/14/14, continue Lorazepam 0.5mg bid/prn bid.  

## 2015-04-13 NOTE — Assessment & Plan Note (Signed)
Not on thyroid med, last TSH wnl 2.568 01/07/15

## 2015-04-13 NOTE — Assessment & Plan Note (Addendum)
C/o stomach crmaps, continue Famotidine 20mg  daily. Adding Omeprazole 20mg  daily. Amylase 45, Lipase 10 03/29/15. Repeat CBC, CMP, Amylase, Lipase, UA C/S if no better next Monday.

## 2015-04-13 NOTE — Progress Notes (Signed)
Patient ID: Verlon Au, female   DOB: [birth year], 81 y.o.   MRN: [id number]  Location:  Clinic [facility] Provider:  Chipper Oman NP  Code Status:  DNR Goals of care: Advanced Directive information    Chief Complaint  Patient presents with  . Acute Visit    Complains of Stomach Cramps     HPI: Patient is a 81 y.o. female seen in the Clinic at [facility] today for evaluation of GERD-stomach cramps on and off, last Amylase and Lipase 03/29/15 wnl. Hx of HTN, pancreatitis, back pain, depression, left hip pain ED eval 01/17/15 CT showed IMPRESSION: Fractures of the left inferior and superior pubic rami. No acute abnormality of the proximal femur.  Review of Systems  Constitutional: Negative for fever, chills and diaphoresis.  HENT: Positive for hearing loss. Negative for congestion and ear pain.   Eyes: Negative for pain, discharge and redness.  Respiratory: Negative for cough, shortness of breath, wheezing and stridor.   Cardiovascular: Negative for chest pain, palpitations and leg swelling.  Gastrointestinal: Positive for heartburn and nausea. Negative for vomiting, abdominal pain, diarrhea and constipation.       Said better since she takes medicine with food.   Genitourinary: Positive for frequency. Negative for dysuria and urgency.  Musculoskeletal: Positive for back pain, joint pain and falls. Negative for myalgias and neck pain.       New onset left hip/pelvis pain with weight bearing sustained from the fall  Skin: Negative for rash.  Neurological: Positive for weakness. Negative for dizziness, tremors, seizures and headaches.  Psychiatric/Behavioral: Positive for memory loss. Negative for suicidal ideas and hallucinations. The patient is nervous/anxious.        Staff reported the patient has increased confusion in the past a 2-3 days    Past Medical History  Diagnosis Date  . Anxiety   . GERD (gastroesophageal reflux disease)   . Anemia   . Hyperlipidemia   .  Back pain   . Hypertension   . Hypothyroidism   . Atrial fibrillation (HCC)   . Hyponatremia     hx of  . Syncope     hx syncope and collapse per paper from [facility]  . Unconscious (HCC) 04/06/12    per [facility] paper -hx unconscious episodes -pt asked not to be sent to hospital for these  . Fx lumbar vertebra-closed (HCC)     hx of per [facility]  . Lower back pain     hx of  . Renal insufficiency 06/07/2014  . A-fib (HCC) 12/16/2012    12/28/13 Digoxin level 0.5   . Abdominal pain 05/31/2014  . CKD (chronic kidney disease) stage 3, GFR 30-59 ml/min 05/31/2014  . Compression fracture of L2 lumbar vertebra with routine healing 06/02/2014     Most likely due to thoracic compression fractures, she does have mild compression fracture of T11 and L2. - Discussed the case with neurosurgery Dr. Lovell Sheehan and he recommended narcotics and a brace - Physical therapy evaluated the patient and recommended home PT - Her pain was controlled with Vicodin and fentanyl.    . Compression fracture of thoracic vertebra, non-traumatic (HCC) 06/02/2014  . Constipation 12/16/2012  . Depression 08/19/2012    Mirtazapine 7.5mg  nightly helped.    Marland Kitchen Hx of pancreatitis 04/06/2012    10/12/13 Amylase 50(0-105) Lipase 11(0-75) 11/23/13 Amylase 39. Lipase 14.    . Protein-calorie malnutrition, severe (HCC) 06/08/2014    Patient Active Problem List   Diagnosis  Date Noted  . Anemia, chronic disease 04/13/2015  . Fracture of left pelvis (HCC) 01/26/2015  . Infection of urinary tract 01/12/2015  . Subacute confusional state 01/05/2015  . Nausea without vomiting 07/14/2014  . Hyperkalemia 06/15/2014  . Protein-calorie malnutrition, severe (HCC) 06/08/2014  . Renal insufficiency 06/07/2014  . Compression fracture of thoracic vertebra, non-traumatic (HCC) 06/02/2014  . Compression fracture of L2 lumbar vertebra with routine healing 06/02/2014  . Abdominal pain 05/31/2014  . CKD (chronic kidney disease)  stage 3, GFR 30-59 ml/min 05/31/2014  . Hypothyroidism   . Constipation 12/16/2012  . A-fib (HCC) 12/16/2012  . Hyponatremia 08/19/2012  . GERD (gastroesophageal reflux disease) 08/19/2012  . Back pain 08/19/2012  . Depression 08/19/2012  . Essential hypertension 04/10/2012  . Abnormal ECG 04/10/2012  . Hx of pancreatitis 04/06/2012    Allergies  Allergen Reactions  . Codeine Other (See Comments)    Unknown reaction. Info from a MAR.  Marland Kitchen Codeine     Unknown.    Medications: Patient's Medications  New Prescriptions   No medications on file  Previous Medications   ACETAMINOPHEN (TYLENOL) 325 MG TABLET    Take 650 mg by mouth every 4 (four) hours as needed for mild pain or fever.   BRIMONIDINE (ALPHAGAN) 0.2 % OPHTHALMIC SOLUTION    Place 1 drop into both eyes 2 (two) times daily.   CHOLECALCIFEROL (VITAMIN D) 2000 UNITS TABLET    Take 2,000 Units by mouth every morning.   CLONIDINE (CATAPRES) 0.1 MG TABLET    Take 0.1 mg by mouth every 8 (eight) hours as needed (SBP >180 DBP >90.).   CLONIDINE (CATAPRES) 0.1 MG TABLET    Take 0.1 mg by mouth daily.   DIGOXIN (LANOXIN) 0.125 MG TABLET    Take 0.0625 mg by mouth daily. Take 1/2 tablet every morning * check pulse daily*   FAMOTIDINE (PEPCID) 20 MG TABLET    Take 20 mg by mouth at bedtime.    FENTANYL (DURAGESIC) 25 MCG/HR PATCH    Apply 1 patch topically every 3 days, chart site, remove old patch before applying new patch   HYDRALAZINE (APRESOLINE) 50 MG TABLET    Take 50 mg by mouth 2 (two) times daily.   LORAZEPAM (ATIVAN) 0.5 MG TABLET    Take one tablet by mouth twice daily for anxiety   LORAZEPAM (ATIVAN) 0.5 MG TABLET    Take 1 tablet (0.5 mg total) by mouth every morning.   MAGNESIUM HYDROXIDE (MILK OF MAGNESIA) 400 MG/5ML SUSPENSION    Take 30 mLs by mouth daily as needed for mild constipation.   METOPROLOL TARTRATE (LOPRESSOR) 25 MG TABLET    Take 3 tablets (75 mg total) by mouth 2 (two) times daily.   MIRTAZAPINE (REMERON)  15 MG TABLET    Take 15 mg by mouth at bedtime.   ONDANSETRON (ZOFRAN) 4 MG TABLET    Take 4 mg by mouth. Take one every 6 hours as needed for nausea or vomiting   POLYETHYLENE GLYCOL (MIRALAX / GLYCOLAX) PACKET    Take 17 g by mouth daily. And daily PRN constipation (standing order).   PROTEIN SUPPLEMENT (PROMOD) POWD    Take 1 scoop by mouth daily. In 120 cc of either Boost, Resource 2.0,  Or any Breeze drink every day per resident preference.   TRAMADOL (ULTRAM) 50 MG TABLET    Take 1 tablet (50 mg total) by mouth every 6 (six) hours as needed.   VITAMIN B-12 (CYANOCOBALAMIN) 500 MCG TABLET  Take 500 mcg by mouth every morning.  Modified Medications   No medications on file  Discontinued Medications   SULFAMETHOXAZOLE-TRIMETHOPRIM (BACTRIM DS,SEPTRA DS) 800-160 MG TABLET    Take by mouth 2 (two) times daily. For 7 days starting 01/09/15.    Physical Exam: Filed Vitals:   04/13/15 1337  BP: 154/82  Pulse: 79  Temp: 97.6 F (36.4 C)  TempSrc: Oral  Height: 5\' 5"  (1.651 m)  Weight: 90 lb 12.8 oz (41.187 kg)   Body mass index is 15.11 kg/(m^2).  Physical Exam  Constitutional: She is oriented to person, place, and time. She appears well-developed and well-nourished. No distress.  HENT:  Head: Normocephalic and atraumatic.  Mouth/Throat: No oropharyngeal exudate.  Eyes: Conjunctivae and EOM are normal. Pupils are equal, round, and reactive to light. Right eye exhibits no discharge. Left eye exhibits no discharge.  Neck: Normal range of motion. Neck supple. No JVD present. No thyromegaly present.  Cardiovascular: Normal rate, regular rhythm and normal heart sounds.   No murmur heard. Pulmonary/Chest: Effort normal and breath sounds normal. No respiratory distress. She has no wheezes. She has no rales.  Abdominal: Soft. Bowel sounds are normal. She exhibits no distension. There is no tenderness.  Right inguinal hernia  Musculoskeletal: She exhibits tenderness (lower back). She  exhibits no edema.  Left hip/pelvis pain with weight bearing since the fall 01/16/15  Neurological: She is alert and oriented to person, place, and time. She has normal reflexes. No cranial nerve deficit. She exhibits normal muscle tone. Coordination normal.  Skin: Skin is warm and dry. No rash noted. She is not diaphoretic. No erythema.  Psychiatric: She has a normal mood and affect. Her behavior is normal. Thought content normal. Her mood appears not anxious. Her affect is not angry, not blunt, not labile and not inappropriate. Her speech is not rapid and/or pressured, not delayed, not tangential and not slurred. She is not agitated, not aggressive, not hyperactive, not withdrawn, not actively hallucinating and not combative. Thought content is not paranoid and not delusional. Cognition and memory are impaired. She does not exhibit a depressed mood. She is communicative. She exhibits abnormal recent memory.    Labs reviewed: Basic Metabolic Panel:  Recent Labs  16/10/96 0400 06/09/14 0354 06/10/14 0645  07/19/14 01/07/15 03/29/15  NA 135 134* 137  < > 136* 138 141  K 4.0 3.4* 4.3  < > 4.2 4.6 4.1  CL 100 100 102  --   --   --   --   CO2 25 25 27   --   --   --   --   GLUCOSE 89 93 118*  --   --   --   --   BUN 29* 21 21  < > 26* 40* 33*  CREATININE 1.00 0.91 0.99  < > 1.1 1.4* 1.4*  CALCIUM 8.6 8.3* 8.8  --   --   --   --   < > = values in this interval not displayed.  Liver Function Tests:  Recent Labs  06/01/14 0510  06/07/14 1651 06/08/14 0400  07/19/14 01/07/15 03/29/15  AST 18  < > 24 21  < > 18 18 13   ALT 9  < > 15 12  < > 9 10 6*  ALKPHOS 78  < > 134* 115  < > 87 75 71  BILITOT 0.9  --  0.7 0.6  --   --   --   --   PROT 6.2  --  6.9 6.2  --   --   --   --   ALBUMIN 3.2*  --  3.6 3.2*  --   --   --   --   < > = values in this interval not displayed.  CBC:  Recent Labs  06/01/14 0510  06/07/14 1651  06/08/14 0927 06/09/14 0354 06/10/14 0645 01/07/15 03/29/15  WBC  7.5  < > 10.9*  < > 8.0 7.8 11.6* 6.7 6.3  NEUTROABS 5.0  --  8.2*  --  5.4  --   --   --   --   HGB 10.9*  < > 12.7  < > 10.9* 11.7* 12.3 10.2* 9.9*  HCT 33.1*  < > 38.1  < > 33.4* 35.8* 36.9 33* 31*  MCV 97.6  --  95.3  < > 96.0 96.2 96.6  --   --   PLT 241  < > 240  < > 215 216 268 219 213  < > = values in this interval not displayed.  Lab Results  Component Value Date   TSH 2.57 01/07/2015   Lab Results  Component Value Date   HGBA1C  07/27/2010    5.6 (NOTE)                                                                       According to the ADA Clinical Practice Recommendations for 2011, when HbA1c is used as a screening test:   >=6.5%   Diagnostic of Diabetes Mellitus           (if abnormal result  is confirmed)  5.7-6.4%   Increased risk of developing Diabetes Mellitus  References:Diagnosis and Classification of Diabetes Mellitus,Diabetes Care,2011,34(Suppl 1):S62-S69 and Standards of Medical Care in         Diabetes - 2011,Diabetes Care,2011,34  (Suppl 1):S11-S61.   Lab Results  Component Value Date   CHOL  07/28/2010    111        ATP III CLASSIFICATION:  <200     mg/dL   Desirable  478-295  mg/dL   Borderline High  >=621    mg/dL   High          HDL 29* 07/28/2010   LDLCALC  07/28/2010    56        Total Cholesterol/HDL:CHD Risk Coronary Heart Disease Risk Table                     Men   Women  1/2 Average Risk   3.4   3.3  Average Risk       5.0   4.4  2 X Average Risk   9.6   7.1  3 X Average Risk  23.4   11.0        Use the calculated Patient Ratio above and the CHD Risk Table to determine the patient's CHD Risk.        ATP III CLASSIFICATION (LDL):  <100     mg/dL   Optimal  308-657  mg/dL   Near or Above                    Optimal  130-159  mg/dL   Borderline  846-962  mg/dL   High  >119>190     mg/dL   Very High   TRIG [id number] 07/28/2010   CHOLHDL 3.8 07/28/2010    Significant Diagnostic Results since last visit: left superior and inferior ramus fx.     Patient Care Team: Kimber RelicArthur G Green, MD as PCP - General (Internal Medicine) Kimber RelicArthur G Green, MD (Internal Medicine)  Assessment/Plan Problem List Items Addressed This Visit    Renal insufficiency (Chronic)    03/29/15 Bun 33, creat 1.41      Hypothyroidism - Primary (Chronic)    Not on thyroid med, last TSH wnl 2.568 01/07/15      Hyponatremia (Chronic)    Resolved, last Na 138 01/07/15      Hyperkalemia    Resolved, last K 4.6 01/07/15      Hx of pancreatitis    01/07/15 Amylase 71, Lipase 12, asymptomatic presently.  Amylase 45, Lipase 10 03/29/15       GERD (gastroesophageal reflux disease)    C/o stomach crmaps, continue Famotidine 20mg  daily. Adding Omeprazole 20mg  daily. Amylase 45, Lipase 10 03/29/15. Repeat CBC, CMP, Amylase, Lipase, UA C/S if no better next Monday.       Fracture of left pelvis (HCC)    01/27/15 CT pelvis: IMPRESSION: Fractures of the left inferior and superior pubic rami. No acute abnormality of the proximal femur. F/u Ortho. Healing nicely,  Minimal pain with weight bearing      Essential hypertension (Chronic)    Chronic elevated SBP, continue Clonidine 0.1mg  daily, Hydralazine 50mg  bid, Metoprolol 75mg  bid. Prn Clonidine for Bp >180/90      Depression    Stable, continue Mirtazapine 15mg  since 07/14/14, continue Lorazepam 0.5mg  bid/prn bid.      Constipation    Stable, continue her MiraLAX daily/prn and prn MOM      Back pain    Pain is adequately controlled, continue Fentanyl patch 7325mcg/hr q72h. Observe the patient      Anemia, chronic disease    03/29/15 Hgb 9.9      A-fib (HCC) (Chronic)    Heart rate is in control. Continue Digoxin 07/19/14 digoxin level 1.0          Family/ staff Communication: continue AL for care needs.   Labs/tests ordered: X-ray L hip and pelvis done 01/16/15.CBC, CMP, Lipase, Amylase. Repeat CBC, CMP, Amylase, Lipase, UA C/S if no better next Monday.   [facility] Aviannah Castoro NP [facility] [phone] [address] [place] [place], [place] [address] On Call:  [phone] & follow prompts after 5pm & weekends Office Phone:  [phone] Office Fax:  [phone]

## 2015-04-13 NOTE — Assessment & Plan Note (Signed)
Resolved, last Na 138 01/07/15

## 2015-04-13 NOTE — Assessment & Plan Note (Signed)
03/29/15 Hgb 9.9

## 2015-04-13 NOTE — Assessment & Plan Note (Signed)
03/29/15 Bun 33, creat 1.41

## 2015-04-13 NOTE — Assessment & Plan Note (Signed)
Heart rate is in control. Continue Digoxin 07/19/14 digoxin level 1.0 

## 2015-04-13 NOTE — Assessment & Plan Note (Signed)
01/27/15 CT pelvis: IMPRESSION: Fractures of the left inferior and superior pubic rami. No acute abnormality of the proximal femur. F/u Ortho. Healing nicely,  Minimal pain with weight bearing

## 2015-04-13 NOTE — Assessment & Plan Note (Signed)
Pain is adequately controlled, continue Fentanyl patch 25mcg/hr q72h. Observe the patient 

## 2015-04-13 NOTE — Assessment & Plan Note (Addendum)
01/07/15 Amylase 71, Lipase 12, asymptomatic presently.  Amylase 45, Lipase 10 03/29/15

## 2015-04-13 NOTE — Assessment & Plan Note (Signed)
Resolved, last K 4.6 01/07/15

## 2015-05-11 ENCOUNTER — Non-Acute Institutional Stay: Payer: Medicare Other | Admitting: Nurse Practitioner

## 2015-05-11 ENCOUNTER — Encounter: Payer: Self-pay | Admitting: Nurse Practitioner

## 2015-05-11 DIAGNOSIS — E871 Hypo-osmolality and hyponatremia: Secondary | ICD-10-CM | POA: Diagnosis not present

## 2015-05-11 DIAGNOSIS — K5900 Constipation, unspecified: Secondary | ICD-10-CM

## 2015-05-11 DIAGNOSIS — K219 Gastro-esophageal reflux disease without esophagitis: Secondary | ICD-10-CM | POA: Diagnosis not present

## 2015-05-11 DIAGNOSIS — N289 Disorder of kidney and ureter, unspecified: Secondary | ICD-10-CM | POA: Diagnosis not present

## 2015-05-11 DIAGNOSIS — F329 Major depressive disorder, single episode, unspecified: Secondary | ICD-10-CM

## 2015-05-11 DIAGNOSIS — F32A Depression, unspecified: Secondary | ICD-10-CM

## 2015-05-11 DIAGNOSIS — Z8719 Personal history of other diseases of the digestive system: Secondary | ICD-10-CM

## 2015-05-11 DIAGNOSIS — I480 Paroxysmal atrial fibrillation: Secondary | ICD-10-CM

## 2015-05-11 DIAGNOSIS — I10 Essential (primary) hypertension: Secondary | ICD-10-CM | POA: Diagnosis not present

## 2015-05-11 DIAGNOSIS — M5440 Lumbago with sciatica, unspecified side: Secondary | ICD-10-CM | POA: Diagnosis not present

## 2015-05-11 DIAGNOSIS — E039 Hypothyroidism, unspecified: Secondary | ICD-10-CM

## 2015-05-11 DIAGNOSIS — D638 Anemia in other chronic diseases classified elsewhere: Secondary | ICD-10-CM | POA: Diagnosis not present

## 2015-05-11 NOTE — Progress Notes (Signed)
Patient ID: [name], female   DOB: 01/27/1919, 82 y.o.   MRN: [id number]  

## 2015-05-11 NOTE — Assessment & Plan Note (Signed)
03/29/15 Hgb 9.9, update CBC

## 2015-05-11 NOTE — Progress Notes (Signed)
Patient ID: [name], female   DOB: [birth year], 82 y.o.   MRN: [id number]  Location:  Clinic [facility] Provider:  Chipper Oman NP  Code Status:  DNR Goals of care: Advanced Directive information Does patient have an advance directive?: Yes, Type of Advance Directive: Out of facility DNR (pink MOST or yellow form);Healthcare Power of Heritage manager Complaint  Patient presents with  . Medical Management of Chronic Issues    Routine visit     HPI: Patient is a 82 y.o. female seen in the Clinic at [facility] today for evaluation of GERD-stomach cramps on and off, last Amylase and Lipase 03/29/15 wnl. Hx of HTN, pancreatitis, back pain, depression, left hip pain ED eval 01/17/15 CT showed IMPRESSION: Fractures of the left inferior and superior pubic rami. No acute abnormality of the proximal femur.  Review of Systems  Constitutional: Negative for fever, chills and diaphoresis.  HENT: Positive for hearing loss. Negative for congestion and ear pain.   Eyes: Negative for pain, discharge and redness.  Respiratory: Negative for cough, shortness of breath, wheezing and stridor.   Cardiovascular: Negative for chest pain, palpitations and leg swelling.  Gastrointestinal: Positive for heartburn and nausea. Negative for vomiting, abdominal pain, diarrhea and constipation.       Said better since she takes medicine with food.   Genitourinary: Positive for frequency. Negative for dysuria and urgency.  Musculoskeletal: Positive for back pain, joint pain and falls. Negative for myalgias and neck pain.       New onset left hip/pelvis pain with weight bearing sustained from the fall  Skin: Negative for rash.  Neurological: Positive for weakness. Negative for dizziness, tremors, seizures and headaches.  Psychiatric/Behavioral: Positive for memory loss. Negative for suicidal ideas and hallucinations. The patient is nervous/anxious.        Staff reported the patient has increased confusion in  the past a 2-3 days    Past Medical History  Diagnosis Date  . Anxiety   . GERD (gastroesophageal reflux disease)   . Anemia   . Hyperlipidemia   . Back pain   . Hypertension   . Hypothyroidism   . Atrial fibrillation (HCC)   . Hyponatremia     hx of  . Syncope     hx syncope and collapse per paper from [facility]  . Unconscious (HCC) 04/06/12    per [facility] paper -hx unconscious episodes -pt asked not to be sent to hospital for these  . Fx lumbar vertebra-closed (HCC)     hx of per [facility]  . Lower back pain     hx of  . Renal insufficiency 06/07/2014  . A-fib (HCC) 12/16/2012    12/28/13 Digoxin level 0.5   . Abdominal pain 05/31/2014  . CKD (chronic kidney disease) stage 3, GFR 30-59 ml/min 05/31/2014  . Compression fracture of L2 lumbar vertebra with routine healing 06/02/2014     Most likely due to thoracic compression fractures, she does have mild compression fracture of T11 and L2. - Discussed the case with neurosurgery Dr. Lovell Sheehan and he recommended narcotics and a brace - Physical therapy evaluated the patient and recommended home PT - Her pain was controlled with Vicodin and fentanyl.    . Compression fracture of thoracic vertebra, non-traumatic (HCC) 06/02/2014  . Constipation 12/16/2012  . Depression 08/19/2012    Mirtazapine 7.5mg  nightly helped.    Marland Kitchen Hx of pancreatitis 04/06/2012    10/12/13 Amylase 50(0-105) Lipase 11(0-75) 11/23/13 Amylase  39. Lipase 14.    . Protein-calorie malnutrition, severe (HCC) 06/08/2014    Patient Active Problem List   Diagnosis Date Noted  . Anemia, chronic disease 04/13/2015  . Fracture of left pelvis (HCC) 01/26/2015  . Infection of urinary tract 01/12/2015  . Subacute confusional state 01/05/2015  . Nausea without vomiting 07/14/2014  . Hyperkalemia 06/15/2014  . Protein-calorie malnutrition, severe (HCC) 06/08/2014  . Renal insufficiency 06/07/2014  . Compression fracture of thoracic vertebra, non-traumatic  (HCC) 06/02/2014  . Compression fracture of L2 lumbar vertebra with routine healing 06/02/2014  . Abdominal pain 05/31/2014  . CKD (chronic kidney disease) stage 3, GFR 30-59 ml/min 05/31/2014  . Hypothyroidism   . Constipation 12/16/2012  . A-fib (HCC) 12/16/2012  . Hyponatremia 08/19/2012  . GERD (gastroesophageal reflux disease) 08/19/2012  . Back pain 08/19/2012  . Depression 08/19/2012  . Essential hypertension 04/10/2012  . Abnormal ECG 04/10/2012  . Hx of pancreatitis 04/06/2012    Allergies  Allergen Reactions  . Codeine Other (See Comments)    Unknown reaction. Info from a MAR.  Marland Kitchen Codeine     Unknown.    Medications: Patient's Medications  New Prescriptions   No medications on file  Previous Medications   ACETAMINOPHEN (TYLENOL) 325 MG TABLET    Take 650 mg by mouth every 4 (four) hours as needed for mild pain or fever.   BRIMONIDINE (ALPHAGAN) 0.2 % OPHTHALMIC SOLUTION    Place 1 drop into both eyes 2 (two) times daily.   CHOLECALCIFEROL (VITAMIN D) 2000 UNITS TABLET    Take 2,000 Units by mouth every morning.   CLONIDINE (CATAPRES) 0.1 MG TABLET    Take 0.1 mg by mouth daily. Hold for Bp (100/60)   DIGOXIN (LANOXIN) 0.125 MG TABLET    Take 0.0625 mg by mouth daily. Take 1/2 tablet every morning * check pulse daily* Hold if pulse 60/min   FAMOTIDINE (PEPCID) 20 MG TABLET    Take 20 mg by mouth at bedtime.    FENTANYL (DURAGESIC) 25 MCG/HR PATCH    Apply 1 patch topically every 3 days, chart site, remove old patch before applying new patch   HYDRALAZINE (APRESOLINE) 50 MG TABLET    Take 50 mg by mouth 2 (two) times daily.   LORAZEPAM (ATIVAN) 0.5 MG TABLET    Take one tablet by mouth twice daily for anxiety   MAGNESIUM HYDROXIDE (MILK OF MAGNESIA) 400 MG/5ML SUSPENSION    Take 30 mLs by mouth daily as needed for mild constipation.   METOPROLOL TARTRATE (LOPRESSOR) 25 MG TABLET    Take 3 tablets (75 mg total) by mouth 2 (two) times daily.   MIRTAZAPINE (REMERON) 15 MG  TABLET    Take 15 mg by mouth at bedtime.   OMEPRAZOLE (PRILOSEC) 20 MG CAPSULE    Take 20 mg by mouth daily.   ONDANSETRON (ZOFRAN) 4 MG TABLET    Take 4 mg by mouth. Take one every 6 hours as needed for nausea or vomiting   POLYETHYLENE GLYCOL (MIRALAX / GLYCOLAX) PACKET    Take 17 g by mouth daily. And daily PRN constipation (standing order).   PROTEIN SUPPLEMENT (PROMOD) POWD    Take 1 scoop by mouth daily. In 120 cc of either Boost, Resource 2.0,  Or any Breeze drink every day per resident preference.   TRAMADOL (ULTRAM) 50 MG TABLET    Take 1 tablet (50 mg total) by mouth every 6 (six) hours as needed.   VITAMIN B-12 (CYANOCOBALAMIN) 500 MCG  TABLET    Take 500 mcg by mouth every morning. Reported on 05/11/2015  Modified Medications   No medications on file  Discontinued Medications   CLONIDINE (CATAPRES) 0.1 MG TABLET    Take 0.1 mg by mouth every 8 (eight) hours as needed (SBP >180 DBP >90.).   CLONIDINE (CATAPRES) 0.1 MG TABLET    Take 0.1 mg by mouth daily.   LORAZEPAM (ATIVAN) 0.5 MG TABLET    Take 1 tablet (0.5 mg total) by mouth every morning.    Physical Exam: Filed Vitals:   05/11/15 0825  BP: 126/70  Pulse: 68  Temp: 98.3 F (36.8 C)  TempSrc: Oral  Resp: 20   There is no weight on file to calculate BMI.  Physical Exam  Constitutional: She is oriented to person, place, and time. She appears well-developed and well-nourished. No distress.  HENT:  Head: Normocephalic and atraumatic.  Mouth/Throat: No oropharyngeal exudate.  Eyes: Conjunctivae and EOM are normal. Pupils are equal, round, and reactive to light. Right eye exhibits no discharge. Left eye exhibits no discharge.  Neck: Normal range of motion. Neck supple. No JVD present. No thyromegaly present.  Cardiovascular: Normal rate, regular rhythm and normal heart sounds.   No murmur heard. Pulmonary/Chest: Effort normal and breath sounds normal. No respiratory distress. She has no wheezes. She has no rales.    Abdominal: Soft. Bowel sounds are normal. She exhibits no distension. There is no tenderness.  Right inguinal hernia  Musculoskeletal: She exhibits tenderness (lower back). She exhibits no edema.  Left hip/pelvis pain with weight bearing since the fall 01/16/15  Neurological: She is alert and oriented to person, place, and time. She has normal reflexes. No cranial nerve deficit. She exhibits normal muscle tone. Coordination normal.  Skin: Skin is warm and dry. No rash noted. She is not diaphoretic. No erythema.  Psychiatric: She has a normal mood and affect. Her behavior is normal. Thought content normal. Her mood appears not anxious. Her affect is not angry, not blunt, not labile and not inappropriate. Her speech is not rapid and/or pressured, not delayed, not tangential and not slurred. She is not agitated, not aggressive, not hyperactive, not withdrawn, not actively hallucinating and not combative. Thought content is not paranoid and not delusional. Cognition and memory are impaired. She does not exhibit a depressed mood. She is communicative. She exhibits abnormal recent memory.    Labs reviewed: Basic Metabolic Panel:  Recent Labs  16/10/96 0400 06/09/14 0354 06/10/14 0645  07/19/14 01/07/15 03/29/15  NA 135 134* 137  < > 136* 138 141  K 4.0 3.4* 4.3  < > 4.2 4.6 4.1  CL 100 100 102  --   --   --   --   CO2 --   --   --   --   GLUCOSE 89 93 118*  --   --   --   --   BUN 29* 21 21  < > 26* 40* 33*  CREATININE 1.00 0.91 0.99  < > 1.1 1.4* 1.4*  CALCIUM 8.6 8.3* 8.8  --   --   --   --   < > = values in this interval not displayed.  Liver Function Tests:  Recent Labs  06/01/14 0510  06/07/14 1651 06/08/14 0400  07/19/14 01/07/15 03/29/15  AST 18  < > 24 21  < > ALT 9  < > 15 12  < > 9 10 6*  ALKPHOS 78  < > 134* 115  < > 87 75 71  BILITOT 0.9  --  0.7 0.6  --   --   --   --   PROT 6.2  --  6.9 6.2  --   --   --   --   ALBUMIN 3.2*  --  3.6 3.2*  --   --    --   --   < > = values in this interval not displayed.  CBC:  Recent Labs  06/01/14 0510  06/07/14 1651  06/08/14 0927 06/09/14 0354 06/10/14 0645 01/07/15 03/29/15  WBC 7.5  < > 10.9*  < > 8.0 7.8 11.6* 6.7 6.3  NEUTROABS 5.0  --  8.2*  --  5.4  --   --   --   --   HGB 10.9*  < > 12.7  < > 10.9* 11.7* 12.3 10.2* 9.9*  HCT 33.1*  < > 38.1  < > 33.4* 35.8* 36.9 33* 31*  MCV 97.6  --  95.3  < > 96.0 96.2 96.6  --   --   PLT 241  < > 240  < > 215 216 268 219 213  < > = values in this interval not displayed.  Lab Results  Component Value Date   TSH 2.57 01/07/2015   Lab Results  Component Value Date   HGBA1C  07/27/2010    5.6 (NOTE)                                                                       According to the ADA Clinical Practice Recommendations for 2011, when HbA1c is used as a screening test:   >=6.5%   Diagnostic of Diabetes Mellitus           (if abnormal result  is confirmed)  5.7-6.4%   Increased risk of developing Diabetes Mellitus  References:Diagnosis and Classification of Diabetes Mellitus,Diabetes Care,2011,34(Suppl 1):S62-S69 and Standards of Medical Care in         Diabetes - 2011,Diabetes Care,2011,34  (Suppl 1):S11-S61.   Lab Results  Component Value Date   CHOL  07/28/2010    111        ATP III CLASSIFICATION:  <200     mg/dL   Desirable  161-096  mg/dL   Borderline High  >=045    mg/dL   High          HDL 29* 07/28/2010   LDLCALC  07/28/2010    56        Total Cholesterol/HDL:CHD Risk Coronary Heart Disease Risk Table                     Men   Women  1/2 Average Risk   3.4   3.3  Average Risk       5.0   4.4  2 X Average Risk   9.6   7.1  3 X Average Risk  23.4   11.0        Use the calculated Patient Ratio above and the CHD Risk Table to determine the patient's CHD Risk.        ATP III CLASSIFICATION (LDL):  <100     mg/dL  Optimal  100-129  mg/dL   Near or Above                    Optimal  130-159  mg/dL   Borderline  161-096   mg/dL   High  >045     mg/dL   Very High   TRIG 409 07/28/2010   CHOLHDL 3.8 07/28/2010    Significant Diagnostic Results since last visit: left superior and inferior ramus fx.   Patient Care Team: Kimber Relic, MD as PCP - General (Internal Medicine) Kimber Relic, MD (Internal Medicine)  Assessment/Plan Problem List Items Addressed This Visit    Renal insufficiency - Primary (Chronic)    03/29/15 Bun 33, creat 1.41      Hypothyroidism (Chronic)    Not on thyroid med, last TSH wnl 2.568 01/07/15, update TSH      Hyponatremia (Chronic)    03/29/15 Na 141, update CMP      Hx of pancreatitis    01/07/15 Amylase 71, Lipase 12, asymptomatic presently.  Amylase 45, Lipase 10 03/29/15 05/11/15 c/o poor appetite, update Amylase, Lipase, UA C/S, CBC, TSH, CMP      GERD (gastroesophageal reflux disease)    Stable, continue Omeprazole  daily      Relevant Medications   omeprazole (PRILOSEC) 20 MG capsule   Essential hypertension (Chronic)    Chronic elevated SBP, continue Clonidine 0.1mg  daily, Hydralazine  bid, Metoprolol  bid. Prn Clonidine for Bp >180/90      Relevant Medications   cloNIDine (CATAPRES) 0.1 MG tablet   Depression    Stable, continue Mirtazapine  since 07/14/14, continue Lorazepam 0.5mg  bid/prn bid.      Constipation    Stable, continue her MiraLAX daily/prn and prn MOM      Back pain    Pain is adequately controlled, continue Fentanyl patch 66mcg/hr q72h. Observe the patient      Anemia, chronic disease    03/29/15 Hgb 9.9, update CBC      A-fib (HCC) (Chronic)    Heart rate is in control. Continue Digoxin 07/19/14 digoxin level 1.0. Repeat Dig level.       Relevant Medications   cloNIDine (CATAPRES) 0.1 MG tablet       Family/ staff Communication: continue AL for care needs.   Labs/tests ordered: CBC, CMP, amylase, lipase, UA c/s. Dig level.   [facility]ast NP Geriatrics [facility] [phone] [address], [place] [address] On Call:  [phone] & follow prompts after 5pm & weekends Office Phone:  [phone] Office Fax:  [phone]

## 2015-05-11 NOTE — Assessment & Plan Note (Signed)
03/29/15 Bun 33, creat 1.41 

## 2015-05-11 NOTE — Assessment & Plan Note (Signed)
Stable, continue Mirtazapine 15mg since 07/14/14, continue Lorazepam 0.5mg bid/prn bid.  

## 2015-05-11 NOTE — Assessment & Plan Note (Signed)
01/07/15 Amylase 71, Lipase 12, asymptomatic presently.  Amylase 45, Lipase 10 03/29/15 05/11/15 c/o poor appetite, update Amylase, Lipase, UA C/S, CBC, TSH, CMP

## 2015-05-11 NOTE — Assessment & Plan Note (Signed)
Stable, continue Omeprazole 20mg daily.  

## 2015-05-11 NOTE — Assessment & Plan Note (Addendum)
Heart rate is in control. Continue Digoxin 07/19/14 digoxin level 1.0. Repeat Dig level.

## 2015-05-11 NOTE — Assessment & Plan Note (Signed)
Pain is adequately controlled, continue Fentanyl patch 25mcg/hr q72h. Observe the patient 

## 2015-05-11 NOTE — Assessment & Plan Note (Addendum)
03/29/15 Na 141, update CMP

## 2015-05-11 NOTE — Progress Notes (Signed)
This encounter was created in error - please disregard.

## 2015-05-11 NOTE — Assessment & Plan Note (Signed)
Stable, continue her MiraLAX daily/prn and prn MOM 

## 2015-05-11 NOTE — Assessment & Plan Note (Signed)
Chronic elevated SBP, continue Clonidine 0.1mg daily, Hydralazine 50mg bid, Metoprolol 75mg bid. Prn Clonidine for Bp >180/90 

## 2015-05-11 NOTE — Assessment & Plan Note (Addendum)
Not on thyroid med, last TSH wnl 2.568 01/07/15, update TSH

## 2015-05-13 LAB — CBC AND DIFFERENTIAL
HEMATOCRIT: 34 % — AB (ref 36–46)
Hemoglobin: 11 g/dL — AB (ref 12.0–16.0)
PLATELETS: 174 10*3/uL (ref 150–399)
WBC: 6.7 10*3/mL

## 2015-05-13 LAB — BASIC METABOLIC PANEL
BUN: 51 mg/dL — AB (ref 4–21)
CREATININE: 2.3 mg/dL — AB (ref 0.5–1.1)
GLUCOSE: 97 mg/dL
Potassium: 4.9 mmol/L (ref 3.4–5.3)
Sodium: 141 mmol/L (ref 137–147)

## 2015-05-13 LAB — HEPATIC FUNCTION PANEL
ALK PHOS: 65 U/L (ref 25–125)
ALT: 6 U/L — AB (ref 7–35)
AST: 12 U/L — AB (ref 13–35)
BILIRUBIN, TOTAL: 0.4 mg/dL

## 2015-05-16 LAB — BASIC METABOLIC PANEL
BUN: 59 mg/dL — AB (ref 4–21)
BUN: 59 mg/dL — AB (ref 4–21)
CREATININE: 2.5 mg/dL — AB (ref 0.5–1.1)
Creatinine: 2.5 mg/dL — AB (ref 0.5–1.1)
GLUCOSE: 89 mg/dL
GLUCOSE: 89 mg/dL
Potassium: 5.2 mmol/L (ref 3.4–5.3)
Potassium: 5.2 mmol/L (ref 3.4–5.3)
SODIUM: 140 mmol/L (ref 137–147)
Sodium: 140 mmol/L (ref 137–147)

## 2015-05-17 ENCOUNTER — Non-Acute Institutional Stay (SKILLED_NURSING_FACILITY): Payer: Medicare Other | Admitting: Nurse Practitioner

## 2015-05-17 ENCOUNTER — Encounter: Payer: Self-pay | Admitting: Nurse Practitioner

## 2015-05-17 DIAGNOSIS — I10 Essential (primary) hypertension: Secondary | ICD-10-CM

## 2015-05-17 DIAGNOSIS — K219 Gastro-esophageal reflux disease without esophagitis: Secondary | ICD-10-CM

## 2015-05-17 DIAGNOSIS — N183 Chronic kidney disease, stage 3 unspecified: Secondary | ICD-10-CM

## 2015-05-17 DIAGNOSIS — K5900 Constipation, unspecified: Secondary | ICD-10-CM | POA: Diagnosis not present

## 2015-05-17 DIAGNOSIS — F329 Major depressive disorder, single episode, unspecified: Secondary | ICD-10-CM

## 2015-05-17 DIAGNOSIS — E039 Hypothyroidism, unspecified: Secondary | ICD-10-CM | POA: Diagnosis not present

## 2015-05-17 DIAGNOSIS — M5440 Lumbago with sciatica, unspecified side: Secondary | ICD-10-CM | POA: Diagnosis not present

## 2015-05-17 DIAGNOSIS — I482 Chronic atrial fibrillation: Secondary | ICD-10-CM | POA: Diagnosis not present

## 2015-05-17 DIAGNOSIS — I4821 Permanent atrial fibrillation: Secondary | ICD-10-CM

## 2015-05-17 DIAGNOSIS — F32A Depression, unspecified: Secondary | ICD-10-CM

## 2015-05-17 DIAGNOSIS — Z8719 Personal history of other diseases of the digestive system: Secondary | ICD-10-CM

## 2015-05-17 DIAGNOSIS — D638 Anemia in other chronic diseases classified elsewhere: Secondary | ICD-10-CM | POA: Diagnosis not present

## 2015-05-17 NOTE — Assessment & Plan Note (Signed)
Chronic elevated SBP, continue Clonidine 0.1mg daily, Hydralazine 50mg bid, Metoprolol 75mg bid. Prn Clonidine for Bp >180/90 

## 2015-05-17 NOTE — Assessment & Plan Note (Signed)
01/07/15 creatinine 1.361 05/16/15 Bun 59, creat 2.5, D5 1/2 NS 75cc/hr x 2000cc IVF, repeat BMP upon completion of IVF

## 2015-05-17 NOTE — Assessment & Plan Note (Signed)
Stable, continue Mirtazapine 15mg since 07/14/14, continue Lorazepam 0.5mg bid/prn bid.  

## 2015-05-17 NOTE — Assessment & Plan Note (Signed)
05/13/15 amylase 29, lipase 8

## 2015-05-17 NOTE — Assessment & Plan Note (Signed)
Stable, continue her MiraLAX daily/prn and prn MOM 

## 2015-05-17 NOTE — Assessment & Plan Note (Signed)
03/29/15 Hgb 9.9 05/13/15 Hgb 11 Hemoconcentration likely given elevated Bun/creat 59/2.5 05/16/15

## 2015-05-17 NOTE — Assessment & Plan Note (Signed)
Stable, continue Omeprazole 20mg daily.  

## 2015-05-17 NOTE — Assessment & Plan Note (Signed)
Not on thyroid med, last TSH wnl 2.568 01/07/15 

## 2015-05-17 NOTE — Assessment & Plan Note (Signed)
Pain is adequately controlled, continue Fentanyl patch 25mcg/hr q72h. Observe the patient 

## 2015-05-17 NOTE — Assessment & Plan Note (Signed)
Heart rate is in control. Continue Digoxin 07/19/14 digoxin level 1.0. 05/13/15 Dig level 2.7, dc Digoxin 

## 2015-05-17 NOTE — Progress Notes (Signed)
Patient ID: [name], female   DOB: March 11, 1919, 82 y.o.   MRN: [id number]

## 2015-05-17 NOTE — Progress Notes (Signed)
Patient ID: [name], female   DOB: 1918/05/16, 82 y.o.   MRN: [id number]  Location:  SNF [facility] Provider:  Chipper Oman NP  Code Status:  DNR Goals of care: Advanced Directive information Does patient have an advance directive?: Yes, Type of Advance Directive: Out of facility DNR (pink MOST or yellow form)  Chief Complaint  Patient presents with  . Acute Visit    Labs     HPI: Patient is a 82 y.o. female seen in the SNF at [facility] today for evaluation of AKI, GERD, Hx of pancreatitis. Hx of HTN, pancreatitis, back pain, depression, left hip pain ED eval 01/17/15 CT showed IMPRESSION: Fractures of the left inferior and superior pubic rami. No acute abnormality of the proximal femur.  Review of Systems  Constitutional: Negative for fever, chills and diaphoresis.  HENT: Positive for hearing loss. Negative for congestion and ear pain.   Eyes: Negative for pain, discharge and redness.  Respiratory: Negative for cough, shortness of breath, wheezing and stridor.   Cardiovascular: Negative for chest pain, palpitations and leg swelling.  Gastrointestinal: Positive for heartburn and nausea. Negative for vomiting, abdominal pain, diarrhea and constipation.       Said better since she takes medicine with food.   Genitourinary: Positive for frequency. Negative for dysuria and urgency.  Musculoskeletal: Positive for back pain, joint pain and falls. Negative for myalgias and neck pain.       New onset left hip/pelvis pain with weight bearing sustained from the fall  Skin: Negative for rash.  Neurological: Positive for weakness. Negative for dizziness, tremors, seizures and headaches.  Psychiatric/Behavioral: Positive for memory loss. Negative for suicidal ideas and hallucinations. The patient is nervous/anxious.        Staff reported the patient has increased confusion in the past a 2-3 days    Past Medical History  Diagnosis Date  . Anxiety   . GERD (gastroesophageal reflux  disease)   . Anemia   . Hyperlipidemia   . Back pain   . Hypertension   . Hypothyroidism   . Atrial fibrillation (HCC)   . Hyponatremia     hx of  . Syncope     hx syncope and collapse per paper from [facility]  . Unconscious (HCC) 04/06/12    per [facility] paper -hx unconscious episodes -pt asked not to be sent to hospital for these  . Fx lumbar vertebra-closed (HCC)     hx of per [facility]  . Lower back pain     hx of  . Renal insufficiency 06/07/2014  . A-fib (HCC) 12/16/2012    12/28/13 Digoxin level 0.5   . Abdominal pain 05/31/2014  . CKD (chronic kidney disease) stage 3, GFR 30-59 ml/min 05/31/2014  . Compression fracture of L2 lumbar vertebra with routine healing 06/02/2014     Most likely due to thoracic compression fractures, she does have mild compression fracture of T11 and L2. - Discussed the case with neurosurgery Dr. Lovell Sheehan and he recommended narcotics and a brace - Physical therapy evaluated the patient and recommended home PT - Her pain was controlled with Vicodin and fentanyl.    . Compression fracture of thoracic vertebra, non-traumatic (HCC) 06/02/2014  . Constipation 12/16/2012  . Depression 08/19/2012    Mirtazapine 7.5mg  nightly helped.    Marland Kitchen Hx of pancreatitis 04/06/2012    10/12/13 Amylase 50(0-105) Lipase 11(0-75) 11/23/13 Amylase 39. Lipase 14.    . Protein-calorie malnutrition, severe (HCC) 06/08/2014  Patient Active Problem List   Diagnosis Date Noted  . Anemia, chronic disease 04/13/2015  . Fracture of left pelvis (HCC) 01/26/2015  . Infection of urinary tract 01/12/2015  . Subacute confusional state 01/05/2015  . Nausea without vomiting 07/14/2014  . Hyperkalemia 06/15/2014  . Protein-calorie malnutrition, severe (HCC) 06/08/2014  . Renal insufficiency 06/07/2014  . Compression fracture of thoracic vertebra, non-traumatic (HCC) 06/02/2014  . Compression fracture of L2 lumbar vertebra with routine healing 06/02/2014  . Abdominal  pain 05/31/2014  . CKD (chronic kidney disease) stage 3, GFR 30-59 ml/min 05/31/2014  . Hypothyroidism   . Constipation 12/16/2012  . A-fib (HCC) 12/16/2012  . Hyponatremia 08/19/2012  . GERD (gastroesophageal reflux disease) 08/19/2012  . Back pain 08/19/2012  . Depression 08/19/2012  . Essential hypertension 04/10/2012  . Abnormal ECG 04/10/2012  . Hx of pancreatitis 04/06/2012    Allergies  Allergen Reactions  . Codeine Other (See Comments)    Unknown reaction. Info from a MAR.  Marland Kitchen Codeine     Unknown.    Medications: Patient's Medications  New Prescriptions   No medications on file  Previous Medications   ACETAMINOPHEN (TYLENOL) 325 MG TABLET    Take 650 mg by mouth every 4 (four) hours as needed for mild pain or fever.   BRIMONIDINE (ALPHAGAN) 0.2 % OPHTHALMIC SOLUTION    Place 1 drop into both eyes 2 (two) times daily.   CALCIUM & MAGNESIUM CARBONATES (MYLANTA PO)    Take by mouth. 30 ml by mouth every four hours as needed   CHOLECALCIFEROL (VITAMIN D) 2000 UNITS TABLET    Take 2,000 Units by mouth every morning.   CLONIDINE (CATAPRES) 0.1 MG TABLET    Take 0.1 mg by mouth every 8 (eight) hours. Hold for Bp (100/60)   DIGOXIN (LANOXIN) 0.125 MG TABLET    1/2 a tablet by mouth daily *hold for pulse < 60*   FAMOTIDINE (PEPCID) 20 MG TABLET    Take 20 mg by mouth at bedtime.    FENTANYL (DURAGESIC) 25 MCG/HR PATCH    Apply 1 patch topically every 3 days, chart site, remove old patch before applying new patch   HYDRALAZINE (APRESOLINE) 50 MG TABLET    Take 50 mg by mouth 2 (two) times daily.   LORAZEPAM (ATIVAN) 0.5 MG TABLET    Take one tablet by mouth twice daily for anxiety   MAGNESIUM HYDROXIDE (MILK OF MAGNESIA) 400 MG/5ML SUSPENSION    Take 30 mLs by mouth daily as needed for mild constipation.   METOPROLOL TARTRATE (LOPRESSOR) 25 MG TABLET    Take 3 tablets (75 mg total) by mouth 2 (two) times daily.   MIRTAZAPINE (REMERON) 15 MG TABLET    Take 15 mg by mouth at  bedtime.   OMEPRAZOLE (PRILOSEC) 20 MG CAPSULE    Take 20 mg by mouth daily.   ONDANSETRON (ZOFRAN) 4 MG TABLET    Take 4 mg by mouth. Take one every 6 hours as needed for nausea or vomiting   POLYETHYLENE GLYCOL (MIRALAX / GLYCOLAX) PACKET    Take 17 g by mouth daily. And daily PRN constipation (standing order).   PROTEIN SUPPLEMENT (PROMOD) POWD    Take 1 scoop by mouth daily. In 120 cc of either Boost, Resource 2.0,  Or any Breeze drink every day per resident preference.   TRAMADOL (ULTRAM) 50 MG TABLET    Take 1 tablet (50 mg total) by mouth every 6 (six) hours as needed.   VITAMIN B-12 (  CYANOCOBALAMIN) 500 MCG TABLET    Take 500 mcg by mouth every morning. Reported on 05/11/2015  Modified Medications   No medications on file  Discontinued Medications   DIGOXIN (LANOXIN) 0.125 MG TABLET    Take 0.0625 mg by mouth daily. Reported on 05/17/2015    Physical Exam: Filed Vitals:   05/17/15 0911  BP: 130/60  Pulse: 76  Temp: 97.4 F (36.3 C)  TempSrc: Oral  Resp: 20  SpO2: 93%   There is no weight on file to calculate BMI.  Physical Exam  Constitutional: She is oriented to person, place, and time. She appears well-developed and well-nourished. No distress.  HENT:  Head: Normocephalic and atraumatic.  Mouth/Throat: No oropharyngeal exudate.  Eyes: Conjunctivae and EOM are normal. Pupils are equal, round, and reactive to light. Right eye exhibits no discharge. Left eye exhibits no discharge.  Neck: Normal range of motion. Neck supple. No JVD present. No thyromegaly present.  Cardiovascular: Normal rate, regular rhythm and normal heart sounds.   No murmur heard. Pulmonary/Chest: Effort normal and breath sounds normal. No respiratory distress. She has no wheezes. She has no rales.  Abdominal: Soft. Bowel sounds are normal. She exhibits no distension. There is no tenderness.  Right inguinal hernia  Musculoskeletal: She exhibits tenderness (lower back). She exhibits no edema.  Left  hip/pelvis pain with weight bearing since the fall 01/16/15  Neurological: She is alert and oriented to person, place, and time. She has normal reflexes. No cranial nerve deficit. She exhibits normal muscle tone. Coordination normal.  Skin: Skin is warm and dry. No rash noted. She is not diaphoretic. No erythema.  Psychiatric: She has a normal mood and affect. Her behavior is normal. Thought content normal. Her mood appears not anxious. Her affect is not angry, not blunt, not labile and not inappropriate. Her speech is not rapid and/or pressured, not delayed, not tangential and not slurred. She is not agitated, not aggressive, not hyperactive, not withdrawn, not actively hallucinating and not combative. Thought content is not paranoid and not delusional. Cognition and memory are impaired. She does not exhibit a depressed mood. She is communicative. She exhibits abnormal recent memory.    Labs reviewed: Basic Metabolic Panel:  Recent Labs  40/98/11 0400 06/09/14 0354 06/10/14 0645  03/29/15 05/13/15 05/16/15  NA 135 134* 137  < > 141 141 140  K 4.0 3.4* 4.3  < > 4.1 4.9 5.2  CL 100 100 102  --   --   --   --   CO2 25 25 27   --   --   --   --   GLUCOSE 89 93 118*  --   --   --   --   BUN 29* 21 21  < > 33* 51* 59*  CREATININE 1.00 0.91 0.99  < > 1.4* 2.3* 2.5*  CALCIUM 8.6 8.3* 8.8  --   --   --   --   < > = values in this interval not displayed.  Liver Function Tests:  Recent Labs  06/01/14 0510  06/07/14 1651 06/08/14 0400  01/07/15 03/29/15 05/13/15  AST 18  < > 24 21  < > 18 13 12*  ALT 9  < > 15 12  < > 10 6* 6*  ALKPHOS 78  < > 134* 115  < > 75 71 65  BILITOT 0.9  --  0.7 0.6  --   --   --   --   PROT 6.2  --  6.9 6.2  --   --   --   --   ALBUMIN 3.2*  --  3.6 3.2*  --   --   --   --   < > = values in this interval not displayed.  CBC:  Recent Labs  06/01/14 0510  06/07/14 1651  06/08/14 0927 06/09/14 0354 06/10/14 0645 01/07/15 03/29/15 05/13/15  WBC 7.5  < > 10.9*   < > 8.0 7.8 11.6* 6.7 6.3 6.7  NEUTROABS 5.0  --  8.2*  --  5.4  --   --   --   --   --   HGB 10.9*  < > 12.7  < > 10.9* 11.7* 12.3 10.2* 9.9* 11.0*  HCT 33.1*  < > 38.1  < > 33.4* 35.8* 36.9 33* 31* 34*  MCV 97.6  --  95.3  < > 96.0 96.2 96.6  --   --   --   PLT 241  < > 240  < > 215 216 268 219 213 174  < > = values in this interval not displayed.  Lab Results  Component Value Date   TSH 2.57 01/07/2015   Lab Results  Component Value Date   HGBA1C  07/27/2010    5.6 (NOTE)                                                                       According to the ADA Clinical Practice Recommendations for 2011, when HbA1c is used as a screening test:   >=6.5%   Diagnostic of Diabetes Mellitus           (if abnormal result  is confirmed)  5.7-6.4%   Increased risk of developing Diabetes Mellitus  References:Diagnosis and Classification of Diabetes Mellitus,Diabetes Care,2011,34(Suppl 1):S62-S69 and Standards of Medical Care in         Diabetes - 2011,Diabetes Care,2011,34  (Suppl 1):S11-S61.   Lab Results  Component Value Date   CHOL  07/28/2010    111        ATP III CLASSIFICATION:  <200     mg/dL   Desirable  643-329  mg/dL   Borderline High  >=518    mg/dL   High          HDL 29* 07/28/2010   LDLCALC  07/28/2010    56        Total Cholesterol/HDL:CHD Risk Coronary Heart Disease Risk Table                     Men   Women  1/2 Average Risk   3.4   3.3  Average Risk       5.0   4.4  2 X Average Risk   9.6   7.1  3 X Average Risk  23.4   11.0        Use the calculated Patient Ratio above and the CHD Risk Table to determine the patient's CHD Risk.        ATP III CLASSIFICATION (LDL):  <100     mg/dL   Optimal  841-660  mg/dL   Near or Above  Optimal  130-159  mg/dL   Borderline  454-098  mg/dL   High  >119     mg/dL   Very High   TRIG 147 07/28/2010   CHOLHDL 3.8 07/28/2010    Significant Diagnostic Results since last visit: left superior and  inferior ramus fx.   Patient Care Team: Kimber Relic, MD as PCP - General (Internal Medicine) Kimber Relic, MD (Internal Medicine)  Assessment/Plan Problem List Items Addressed This Visit    Hypothyroidism (Chronic)    Not on thyroid med, last TSH wnl 2.568 01/07/15      Hx of pancreatitis    05/13/15 amylase 29, lipase 8      GERD (gastroesophageal reflux disease)    Stable, continue Omeprazole  daily      Relevant Medications   Calcium & Magnesium Carbonates (MYLANTA PO)   Essential hypertension (Chronic)    Chronic elevated SBP, continue Clonidine 0.1mg  daily, Hydralazine  bid, Metoprolol  bid. Prn Clonidine for Bp >180/90      Relevant Medications   digoxin (LANOXIN) 0.125 MG tablet   Depression    Stable, continue Mirtazapine  since 07/14/14, continue Lorazepam 0.5mg  bid/prn bid.      Constipation    Stable, continue her MiraLAX daily/prn and prn MOM      CKD (chronic kidney disease) stage 3, GFR 30-59 ml/min (Chronic)    01/07/15 creatinine 1.361 05/16/15 Bun 59, creat 2.5, D5 1/2 NS 75cc/hr x 2000cc IVF, repeat BMP upon completion of IVF      Back pain    Pain is adequately controlled, continue Fentanyl patch 58mcg/hr q72h. Observe the patient      Anemia, chronic disease    03/29/15 Hgb 9.9 05/13/15 Hgb 11 Hemoconcentration likely given elevated Bun/creat 59/2.5 05/16/15      A-fib (HCC) - Primary (Chronic)    Heart rate is in control. Continue Digoxin 07/19/14 digoxin level 1.0. 05/13/15 Dig level 2.7, dc Digoxin      Relevant Medications   digoxin (LANOXIN) 0.125 MG tablet       Family/ staff Communication: AL after IVF completed and AKI is resolved.   Labs/tests ordered: BMP upon completion of IVF  [facility] Tyneshia Stivers NP Geriatrics [facility] [address], [place] [address] On Call:  [phone] & follow prompts after 5pm & weekends Office Phone:  [phone] Office Fax:   [phone]

## 2015-05-18 ENCOUNTER — Encounter: Payer: Self-pay | Admitting: Nurse Practitioner

## 2015-05-18 ENCOUNTER — Non-Acute Institutional Stay (SKILLED_NURSING_FACILITY): Payer: Medicare Other | Admitting: Nurse Practitioner

## 2015-05-18 DIAGNOSIS — E039 Hypothyroidism, unspecified: Secondary | ICD-10-CM

## 2015-05-18 DIAGNOSIS — M5440 Lumbago with sciatica, unspecified side: Secondary | ICD-10-CM

## 2015-05-18 DIAGNOSIS — R627 Adult failure to thrive: Secondary | ICD-10-CM | POA: Insufficient documentation

## 2015-05-18 DIAGNOSIS — F329 Major depressive disorder, single episode, unspecified: Secondary | ICD-10-CM

## 2015-05-18 DIAGNOSIS — I10 Essential (primary) hypertension: Secondary | ICD-10-CM | POA: Diagnosis not present

## 2015-05-18 DIAGNOSIS — N183 Chronic kidney disease, stage 3 unspecified: Secondary | ICD-10-CM

## 2015-05-18 DIAGNOSIS — I480 Paroxysmal atrial fibrillation: Secondary | ICD-10-CM

## 2015-05-18 DIAGNOSIS — R0902 Hypoxemia: Secondary | ICD-10-CM | POA: Insufficient documentation

## 2015-05-18 DIAGNOSIS — K5900 Constipation, unspecified: Secondary | ICD-10-CM

## 2015-05-18 DIAGNOSIS — E875 Hyperkalemia: Secondary | ICD-10-CM | POA: Diagnosis not present

## 2015-05-18 DIAGNOSIS — F32A Depression, unspecified: Secondary | ICD-10-CM

## 2015-05-18 DIAGNOSIS — E871 Hypo-osmolality and hyponatremia: Secondary | ICD-10-CM

## 2015-05-18 DIAGNOSIS — K219 Gastro-esophageal reflux disease without esophagitis: Secondary | ICD-10-CM | POA: Diagnosis not present

## 2015-05-18 LAB — BASIC METABOLIC PANEL
BUN: 50 mg/dL — AB (ref 4–21)
Creatinine: 1.8 mg/dL — AB (ref 0.5–1.1)
Glucose: 82 mg/dL
POTASSIUM: 4.7 mmol/L (ref 3.4–5.3)
Sodium: 134 mmol/L — AB (ref 137–147)

## 2015-05-18 NOTE — Assessment & Plan Note (Signed)
Controlled,  continue Clonidine 0.1mg  daily, Hydralazine  bid, Metoprolol  bid. Prn Clonidine for Bp >180/90, hold Clonidine if BP<120/80

## 2015-05-18 NOTE — Assessment & Plan Note (Signed)
05/18/15 K 4.7

## 2015-05-18 NOTE — Assessment & Plan Note (Signed)
Denied chest pain or SOB, no noted moist rales or cough, Sat O2 93% O2 3lpm via [place], will obtain CXR, empirical tx for ? PNA, Augmentin  bid x 7 days.

## 2015-05-18 NOTE — Assessment & Plan Note (Signed)
Heart rate is in control. Continue Digoxin 07/19/14 digoxin level 1.0. 05/13/15 Dig level 2.7, dc Digoxin

## 2015-05-18 NOTE — Assessment & Plan Note (Signed)
05/18/15 Na 134

## 2015-05-18 NOTE — Assessment & Plan Note (Signed)
Comfort measures, pending BMP, low BP-VS q4h, will adjust bp meds.

## 2015-05-18 NOTE — Assessment & Plan Note (Signed)
01/07/15 creatinine 1.361 05/16/15 Bun 59, creat 2.5, D5 1/2 NS 75cc/hr x 2000cc IVF, repeat BMP upon completion of IVF 05/18/15 Bun 50, creat 1.8, Na 134, D5NS 50cc/hr x 2000cc.

## 2015-05-18 NOTE — Assessment & Plan Note (Signed)
Stable, continue Omeprazole 20mg daily.  

## 2015-05-18 NOTE — Assessment & Plan Note (Signed)
Pain is adequately controlled, continue Fentanyl patch 25mcg/hr q72h. Observe the patient 

## 2015-05-18 NOTE — Assessment & Plan Note (Signed)
Not on thyroid med, last TSH wnl 2.568 01/07/15 

## 2015-05-18 NOTE — Progress Notes (Signed)
Patient ID: [name], female   DOB: 05-03-1918, 82 y.o.   MRN: [id number]  Location:  SNF [facility] Provider:  Chipper Oman NP  Code Status:  DNR Goals of care: Advanced Directive information    Chief Complaint  Patient presents with  . Medical Management of Chronic Issues  . Acute Visit    O2 desaturation.     HPI: Patient is a 82 y.o. female seen in the SNF at [facility] today for evaluation of malaise, O2 desaturation, AKI, creat down from  GERD, Hx of pancreatitis. Hx of HTN, pancreatitis, back pain, depression, left hip pain ED eval 01/17/15 CT showed IMPRESSION: Fractures of the left inferior and superior pubic rami. No acute abnormality of the proximal femur.  Review of Systems  Constitutional: Positive for malaise/fatigue. Negative for fever, chills and diaphoresis.  HENT: Positive for hearing loss. Negative for congestion and ear pain.   Eyes: Negative for pain, discharge and redness.  Respiratory: Negative for cough, shortness of breath, wheezing and stridor.        O2 desaturation  Cardiovascular: Negative for chest pain, palpitations and leg swelling.  Gastrointestinal: Positive for heartburn and nausea. Negative for vomiting, abdominal pain, diarrhea and constipation.       Said better since she takes medicine with food.   Genitourinary: Positive for frequency. Negative for dysuria and urgency.  Musculoskeletal: Positive for back pain, joint pain and falls. Negative for myalgias and neck pain.       New onset left hip/pelvis pain with weight bearing sustained from the fall  Skin: Negative for rash.  Neurological: Positive for weakness. Negative for dizziness, tremors, seizures and headaches.  Psychiatric/Behavioral: Positive for memory loss. Negative for suicidal ideas and hallucinations. The patient is nervous/anxious.        Staff reported the patient has increased confusion in the past a 2-3 days    Past Medical History  Diagnosis Date  . Anxiety     . GERD (gastroesophageal reflux disease)   . Anemia   . Hyperlipidemia   . Back pain   . Hypertension   . Hypothyroidism   . Atrial fibrillation (HCC)   . Hyponatremia     hx of  . Syncope     hx syncope and collapse per paper from [facility]  . Unconscious (HCC) 04/06/12    per [facility] paper -hx unconscious episodes -pt asked not to be sent to hospital for these  . Fx lumbar vertebra-closed (HCC)     hx of [facility]  . Lower back pain     hx of  . Renal insufficiency 06/07/2014  . A-fib (HCC) 12/16/2012    12/28/13 Digoxin level 0.5   . Abdominal pain 05/31/2014  . CKD (chronic kidney disease) stage 3, GFR 30-59 ml/min 05/31/2014  . Compression fracture of L2 lumbar vertebra with routine healing 06/02/2014     Most likely due to thoracic compression fractures, she does have mild compression fracture of T11 and L2. - Discussed the case with neurosurgery Dr. Lovell Sheehan and he recommended narcotics and a brace - Physical therapy evaluated the patient and recommended home PT - Her pain was controlled with Vicodin and fentanyl.    . Compression fracture of thoracic vertebra, non-traumatic (HCC) 06/02/2014  . Constipation 12/16/2012  . Depression 08/19/2012    Mirtazapine 7.5mg  nightly helped.    Marland Kitchen Hx of pancreatitis 04/06/2012    10/12/13 Amylase 50(0-105) Lipase 11(0-75) 11/23/13 Amylase 39. Lipase 14.    Marland Kitchen  Protein-calorie malnutrition, severe (HCC) 06/08/2014    Patient Active Problem List   Diagnosis Date Noted  . FTT (failure to thrive) in adult 05/18/2015  . Hypoxemia 05/18/2015  . Anemia, chronic disease 04/13/2015  . Fracture of left pelvis (HCC) 01/26/2015  . Infection of urinary tract 01/12/2015  . Subacute confusional state 01/05/2015  . Nausea without vomiting 07/14/2014  . Hyperkalemia 06/15/2014  . Protein-calorie malnutrition, severe (HCC) 06/08/2014  . Renal insufficiency 06/07/2014  . Compression fracture of thoracic vertebra, non-traumatic (HCC)  06/02/2014  . Compression fracture of L2 lumbar vertebra with routine healing 06/02/2014  . Abdominal pain 05/31/2014  . CKD (chronic kidney disease) stage 3, GFR 30-59 ml/min 05/31/2014  . Hypothyroidism   . Constipation 12/16/2012  . A-fib (HCC) 12/16/2012  . Hyponatremia 08/19/2012  . GERD (gastroesophageal reflux disease) 08/19/2012  . Back pain 08/19/2012  . Depression 08/19/2012  . Essential hypertension 04/10/2012  . Abnormal ECG 04/10/2012  . Hx of pancreatitis 04/06/2012    Allergies  Allergen Reactions  . Codeine Other (See Comments)    Unknown reaction. Info from a MAR.  Marland Kitchen Codeine     Unknown.    Medications: Patient's Medications  New Prescriptions   No medications on file  Previous Medications   ACETAMINOPHEN (TYLENOL) 325 MG TABLET    Take 650 mg by mouth every 4 (four) hours as needed for mild pain or fever.   BRIMONIDINE (ALPHAGAN) 0.2 % OPHTHALMIC SOLUTION    Place 1 drop into both eyes 2 (two) times daily.   CALCIUM & MAGNESIUM CARBONATES (MYLANTA PO)    Take by mouth. 30 ml by mouth every four hours as needed   CHOLECALCIFEROL (VITAMIN D) 2000 UNITS TABLET    Take 2,000 Units by mouth every morning.   CLONIDINE (CATAPRES) 0.1 MG TABLET    Take 0.1 mg by mouth every 8 (eight) hours. Hold for Bp (100/60)   DIGOXIN (LANOXIN) 0.125 MG TABLET    1/2 a tablet by mouth daily *hold for pulse < 60*   FAMOTIDINE (PEPCID) 20 MG TABLET    Take 20 mg by mouth at bedtime.    FENTANYL (DURAGESIC) 25 MCG/HR PATCH    Apply 1 patch topically every 3 days, chart site, remove old patch before applying new patch   HYDRALAZINE (APRESOLINE) 50 MG TABLET    Take 50 mg by mouth 2 (two) times daily.   LORAZEPAM (ATIVAN) 0.5 MG TABLET    Take one tablet by mouth twice daily for anxiety   MAGNESIUM HYDROXIDE (MILK OF MAGNESIA) 400 MG/5ML SUSPENSION    Take 30 mLs by mouth daily as needed for mild constipation.   METOPROLOL TARTRATE (LOPRESSOR) 25 MG TABLET    Take 3 tablets (75 mg  total) by mouth 2 (two) times daily.   MIRTAZAPINE (REMERON) 15 MG TABLET    Take 15 mg by mouth at bedtime.   OMEPRAZOLE (PRILOSEC) 20 MG CAPSULE    Take 20 mg by mouth daily.   ONDANSETRON (ZOFRAN) 4 MG TABLET    Take 4 mg by mouth. Take one every 6 hours as needed for nausea or vomiting   POLYETHYLENE GLYCOL (MIRALAX / GLYCOLAX) PACKET    Take 17 g by mouth daily. And daily PRN constipation (standing order).   PROTEIN SUPPLEMENT (PROMOD) POWD    Take 1 scoop by mouth daily. In 120 cc of either Boost, Resource 2.0,  Or any Breeze drink every day per resident preference.   TRAMADOL (ULTRAM) 50 MG TABLET  Take 1 tablet (50 mg total) by mouth every 6 (six) hours as needed.   VITAMIN B-12 (CYANOCOBALAMIN) 500 MCG TABLET    Take 500 mcg by mouth every morning. Reported on 05/11/2015  Modified Medications   No medications on file  Discontinued Medications   No medications on file    Physical Exam: Filed Vitals:   05/18/15 1407  BP: 142/82  Pulse: 88  Temp: 96.7 F (35.9 C)  TempSrc: Tympanic  Resp: 20   There is no weight on file to calculate BMI.  Physical Exam  Constitutional: She is oriented to person, place, and time. She appears well-developed and well-nourished. No distress.  HENT:  Head: Normocephalic and atraumatic.  Mouth/Throat: No oropharyngeal exudate.  Eyes: Conjunctivae and EOM are normal. Pupils are equal, round, and reactive to light. Right eye exhibits no discharge. Left eye exhibits no discharge.  Neck: Normal range of motion. Neck supple. No JVD present. No thyromegaly present.  Cardiovascular: Normal rate, regular rhythm and normal heart sounds.   No murmur heard. Pulmonary/Chest: Effort normal and breath sounds normal. No respiratory distress. She has no wheezes. She has no rales.  Abdominal: Soft. Bowel sounds are normal. She exhibits no distension. There is no tenderness.  Right inguinal hernia  Musculoskeletal: She exhibits tenderness (lower back). She  exhibits no edema.  Left hip/pelvis pain with weight bearing since the fall 01/16/15  Neurological: She is alert and oriented to person, place, and time. She has normal reflexes. No cranial nerve deficit. She exhibits normal muscle tone. Coordination normal.  Skin: Skin is warm and dry. No rash noted. She is not diaphoretic. No erythema.  Psychiatric: She has a normal mood and affect. Her behavior is normal. Thought content normal. Her mood appears not anxious. Her affect is not angry, not blunt, not labile and not inappropriate. Her speech is not rapid and/or pressured, not delayed, not tangential and not slurred. She is not agitated, not aggressive, not hyperactive, not withdrawn, not actively hallucinating and not combative. Thought content is not paranoid and not delusional. Cognition and memory are impaired. She does not exhibit a depressed mood. She is communicative. She exhibits abnormal recent memory.    Labs reviewed: Basic Metabolic Panel:  Recent Labs  16/10/96 0400 06/09/14 0354 06/10/14 0645  05/13/15 05/16/15 05/18/15  NA 135 134* 137  < > 141 140 134*  K 4.0 3.4* 4.3  < > 4.9 5.2 4.7  CL 100 100 102  --   --   --   --   CO2 --   --   --   --   GLUCOSE 89 93 118*  --   --   --   --   BUN 29* 21 21  < > 51* 59* 50*  CREATININE 1.00 0.91 0.99  < > 2.3* 2.5* 1.8*  CALCIUM 8.6 8.3* 8.8  --   --   --   --   < > = values in this interval not displayed.  Liver Function Tests:  Recent Labs  06/01/14 0510  06/07/14 1651 06/08/14 0400  01/07/15 03/29/15 05/13/15  AST 18  < > 24 21  < > 18 13 12*  ALT 9  < > 15 12  < > 10 6* 6*  ALKPHOS 78  < > 134* 115  < > 75 71 65  BILITOT 0.9  --  0.7 0.6  --   --   --   --   PROT  6.2  --  6.9 6.2  --   --   --   --   ALBUMIN 3.2*  --  3.6 3.2*  --   --   --   --   < > = values in this interval not displayed.  CBC:  Recent Labs  06/01/14 0510  06/07/14 1651  06/08/14 0927 06/09/14 0354 06/10/14 0645 01/07/15 03/29/15  05/13/15  WBC 7.5  < > 10.9*  < > 8.0 7.8 11.6* 6.7 6.3 6.7  NEUTROABS 5.0  --  8.2*  --  5.4  --   --   --   --   --   HGB 10.9*  < > 12.7  < > 10.9* 11.7* 12.3 10.2* 9.9* 11.0*  HCT 33.1*  < > 38.1  < > 33.4* 35.8* 36.9 33* 31* 34*  MCV 97.6  --  95.3  < > 96.0 96.2 96.6  --   --   --   PLT 241  < > 240  < > 215 216 268 219 213 174  < > = values in this interval not displayed.  Lab Results  Component Value Date   TSH 2.57 01/07/2015   Lab Results  Component Value Date   HGBA1C  07/27/2010    5.6 (NOTE)                                                                       According to the ADA Clinical Practice Recommendations for 2011, when HbA1c is used as a screening test:   >=6.5%   Diagnostic of Diabetes Mellitus           (if abnormal result  is confirmed)  5.7-6.4%   Increased risk of developing Diabetes Mellitus  References:Diagnosis and Classification of Diabetes Mellitus,Diabetes Care,2011,34(Suppl 1):S62-S69 and Standards of Medical Care in         Diabetes - 2011,Diabetes Care,2011,34  (Suppl 1):S11-S61.   Lab Results  Component Value Date   CHOL  07/28/2010    111        ATP III CLASSIFICATION:  <200     mg/dL   Desirable  161-096  mg/dL   Borderline High  >=045    mg/dL   High          HDL 29* 07/28/2010   LDLCALC  07/28/2010    56        Total Cholesterol/HDL:CHD Risk Coronary Heart Disease Risk Table                     Men   Women  1/2 Average Risk   3.4   3.3  Average Risk       5.0   4.4  2 X Average Risk   9.6   7.1  3 X Average Risk  23.4   11.0        Use the calculated Patient Ratio above and the CHD Risk Table to determine the patient's CHD Risk.        ATP III CLASSIFICATION (LDL):  <100     mg/dL   Optimal  409-811  mg/dL   Near or Above  Optimal  130-159  mg/dL   Borderline  161-096  mg/dL   High  >045     mg/dL   Very High   TRIG 409 07/28/2010   CHOLHDL 3.8 07/28/2010    Significant Diagnostic Results since last  visit: hx of left superior and inferior ramus fx.   Patient Care Team: Kimber Relic, MD as PCP - General (Internal Medicine) Kimber Relic, MD (Internal Medicine)  Assessment/Plan Problem List Items Addressed This Visit    Essential hypertension (Chronic)    Controlled,  continue Clonidine 0.1mg  daily, Hydralazine 50mg  bid, Metoprolol 75mg  bid. Prn Clonidine for Bp >180/90, hold Clonidine if BP<120/80      Hyponatremia (Chronic)    05/18/15 Na 134      A-fib (HCC) (Chronic)    Heart rate is in control. Continue Digoxin 07/19/14 digoxin level 1.0. 05/13/15 Dig level 2.7, dc Digoxin      Hypothyroidism (Chronic)    Not on thyroid med, last TSH wnl 2.568 01/07/15      CKD (chronic kidney disease) stage 3, GFR 30-59 ml/min - Primary (Chronic)    01/07/15 creatinine 1.361 05/16/15 Bun 59, creat 2.5, D5 1/2 NS 75cc/hr x 2000cc IVF, repeat BMP upon completion of IVF 05/18/15 Bun 50, creat 1.8, Na 134, D5NS 50cc/hr x 2000cc.       GERD (gastroesophageal reflux disease)    Stable, continue Omeprazole 20mg  daily      Back pain    Pain is adequately controlled, continue Fentanyl patch 38mcg/hr q72h. Observe the patient      Depression    Stable, continue Mirtazapine 15mg  since 07/14/14, continue Lorazepam 0.5mg  bid/prn bid.      Constipation    Stable, continue her MiraLAX daily/prn and prn MOM      Hyperkalemia    05/18/15 K 4.7      FTT (failure to thrive) in adult    Comfort measures, pending BMP, low BP-VS q4h, will adjust bp meds.       Hypoxemia    Denied chest pain or SOB, no noted moist rales or cough, Sat O2 93% O2 3lpm via [place], will obtain CXR, empirical tx for ? PNA, Augmentin 875mg  bid x 7 days.           Family/ staff Communication: AL after IVF completed and AKI is resolved.   Labs/tests ordered: CXR  [facility] Mast NP [facility] [address], [place] [address] On Call:  [phone] & follow prompts  after 5pm & weekends Office Phone:  [phone] Office Fax:  [phone]

## 2015-05-18 NOTE — Assessment & Plan Note (Signed)
Stable, continue Mirtazapine 15mg since 07/14/14, continue Lorazepam 0.5mg bid/prn bid.  

## 2015-05-18 NOTE — Assessment & Plan Note (Signed)
Stable, continue her MiraLAX daily/prn and prn MOM 

## 2015-05-22 ENCOUNTER — Encounter: Payer: Self-pay | Admitting: Nurse Practitioner

## 2015-05-22 ENCOUNTER — Non-Acute Institutional Stay (SKILLED_NURSING_FACILITY): Payer: Medicare Other | Admitting: Nurse Practitioner

## 2015-05-22 DIAGNOSIS — I482 Chronic atrial fibrillation: Secondary | ICD-10-CM

## 2015-05-22 DIAGNOSIS — I10 Essential (primary) hypertension: Secondary | ICD-10-CM

## 2015-05-22 DIAGNOSIS — I5041 Acute combined systolic (congestive) and diastolic (congestive) heart failure: Secondary | ICD-10-CM

## 2015-05-22 DIAGNOSIS — M5440 Lumbago with sciatica, unspecified side: Secondary | ICD-10-CM

## 2015-05-22 DIAGNOSIS — I4821 Permanent atrial fibrillation: Secondary | ICD-10-CM

## 2015-05-22 DIAGNOSIS — R627 Adult failure to thrive: Secondary | ICD-10-CM

## 2015-05-22 HISTORY — DX: Acute combined systolic (congestive) and diastolic (congestive) heart failure: I50.41

## 2015-05-22 NOTE — Progress Notes (Signed)
This encounter was created in error - please disregard.

## 2015-05-24 ENCOUNTER — Encounter: Payer: Self-pay | Admitting: Nurse Practitioner

## 2015-05-24 ENCOUNTER — Non-Acute Institutional Stay (SKILLED_NURSING_FACILITY): Payer: Medicare Other | Admitting: Internal Medicine

## 2015-05-24 DIAGNOSIS — S329XXS Fracture of unspecified parts of lumbosacral spine and pelvis, sequela: Secondary | ICD-10-CM | POA: Diagnosis not present

## 2015-05-24 DIAGNOSIS — N183 Chronic kidney disease, stage 3 unspecified: Secondary | ICD-10-CM

## 2015-05-24 DIAGNOSIS — I5041 Acute combined systolic (congestive) and diastolic (congestive) heart failure: Secondary | ICD-10-CM

## 2015-05-26 ENCOUNTER — Encounter: Payer: Self-pay | Admitting: Internal Medicine

## 2015-05-26 NOTE — Progress Notes (Signed)
Patient ID: [name], female   DOB: 1919-04-14, 82 y.o.   MRN: [id number]    HISTORY AND PHYSICAL  Location:  [place] Room Number: 17 Place of Service: SNF [phone])   Extended Emergency Contact Information Primary Emergency Contact: [place] Phone: [phone] Relation: Relative Secondary Emergency Contact: Murrelle,Betsy Address: [address] [place], [place] [address] [place] Phone: [phone] Mobile Phone: [phone] Relation: Daughter  Advanced Directive information Does patient have an advance directive?: Yes, Type of Advance Directive: Out of facility DNR (pink MOST or yellow form), Pre-existing out of facility DNR order (yellow form or pink MOST form): Yellow form placed in chart (order not valid for inpatient use), Does patient want to make changes to advanced directive?: No - Patient declined  Chief Complaint  Patient presents with  . New Admit To SNF    Declining condition    HPI:  82 year old female admitted to the skilled nursing facility area of [facility] for increasing confusion, poor intake, acute kidney injury, and failure to thrive. She had a fracture of the left superior and inferior pubic rami last fall and has been declining since then. Recently she also had evidence of congestive heart failure on the chest x-ray 05/19/15. Pulmonary edema and a possible opacity left mid and lower lung was noted. She was noted to be dig toxic and toxic 05/12/2015 with a dig level II.7. Digoxin was discontinued upon receipt of this lab work 05/15/2015.  Patient's oral intake has been virtually nothing for the last several days. She is lying in bed and staring. There is no verbal response. She will blink her eyes on confrontation with a hand moving towards her. She is on comfort care and appears to be near death at this time.  When patient was initially admitted  with lab evidence of a significant rise in the BUN and creatinine, she received IV fluids 2000 mL. There was a slight improvement in both the BUN and creatinine subsequent to that. IV fluids were discontinued when she did not respond any better in regards to shortness or responsiveness.  Because of the appearance of the opacity in the left lung, she was started on Florastor and Augmentin on 05/18/2015. She has not swallowed these medications since they were started.  Past Medical History  Diagnosis Date  . Anxiety   . GERD (gastroesophageal reflux disease)   . Anemia   . Hyperlipidemia   . Back pain   . Hypertension   . Hypothyroidism   . Atrial fibrillation ([place])   . Hyponatremia     hx of  . Syncope     hx syncope and collapse per paper from [facility]  . Unconscious ([place]) 04/06/12    per [facility] paper -hx unconscious episodes -pt asked not to be sent to hospital for these  . Fx lumbar vertebra-closed ([place])     hx of per [facility]  . Lower back pain     hx of  . Renal insufficiency 06/07/2014  . A-fib ([place]) 12/16/2012    12/28/13 Digoxin level 0.5   . Abdominal pain 05/31/2014  . CKD (chronic kidney disease) stage 3, GFR 30-59 ml/min 05/31/2014  . Compression fracture of L2 lumbar vertebra with routine healing 06/02/2014     Most likely due to thoracic compression fractures, she does have mild compression  fracture of T11 and L2. - Discussed the case with neurosurgery Dr. Arnoldo Morale and he recommended narcotics and a brace - Physical therapy evaluated the patient and recommended home PT - Her pain was controlled with Vicodin and fentanyl.    . Compression fracture of thoracic vertebra, non-traumatic ([place]) 06/02/2014  . Constipation 12/16/2012  . Depression 08/19/2012    Mirtazapine 7.22m nightly helped.    .Marland KitchenHx of pancreatitis 04/06/2012    10/12/13 Amylase 50(0-105) Lipase 11(0-75) 11/23/13 Amylase 39. Lipase 14.    . Protein-calorie malnutrition, severe ([place] 06/08/2014   . Acute combined systolic and diastolic congestive heart failure ([place] 05/22/2015    Past Surgical History  Procedure Laterality Date  . Abdominal hysterectomy    . Cataract extraction w/ intraocular lens  implant, bilateral    . Abdominal hysterectomy  1987  . Tonsillectomy and adenoidectomy    . Breast lumpectomy    . Colonoscopy  2003    Dr. EOletta Lamas   Patient Care Team: AEstill Dooms MD as PCP - General (Internal Medicine) AEstill Dooms MD (Internal Medicine)  Social History   Social History  . Marital Status: Widowed    Spouse Name: N/A  . Number of Children: N/A  . Years of Education: N/A   Occupational History  . retired nMarine scientist   Social History Main Topics  . Smoking status: Never Smoker   . Smokeless tobacco: Never Used  . Alcohol Use: No  . Drug Use: No  . Sexual Activity: No   Other Topics Concern  . Not on file   Social History Narrative   ** Merged History Encounter **       [facility]   Widowed   Never smoked   Alcohol none   Exercise none   No siblings   Children one step daughter    Living Will/POA/DNR                   reports that she has never smoked. She has never used smokeless tobacco. She reports that she does not drink alcohol or use illicit drugs.  Family History  Problem Relation Age of Onset  . Bleeding Disorder Father     "died of blood poisoning"   Family Status  Relation Status Death Age  . Mother Deceased     child brith  . Father Deceased 328   Sepsis  . Daughter Alive     Immunization History  Administered Date(s) Administered  . Influenza-Unspecified 02/18/2013, 02/17/2014, 01/03/2015  . PPD Test 05/01/2012, 05/16/2015    Allergies  Allergen Reactions  . Codeine Other (See Comments)    Unknown reaction. Info from a MAR.  .Marland KitchenCodeine     Unknown.    Medications: Patient's Medications  New Prescriptions   No medications on file  Previous Medications    ACETAMINOPHEN (TYLENOL) 325 MG TABLET    Take 650 mg by mouth every 4 (four) hours as needed for mild pain or fever.   BRIMONIDINE (ALPHAGAN) 0.2 % OPHTHALMIC SOLUTION    Place 1 drop into both eyes 2 (two) times daily.   CALCIUM & MAGNESIUM CARBONATES (MYLANTA PO)    Take by mouth. 30 ml by mouth every four hours as needed   CHOLECALCIFEROL (VITAMIN D) 2000 UNITS TABLET    Take 2,000 Units by mouth every morning.   CLONIDINE (CATAPRES) 0.1 MG TABLET    Take 0.1 mg by mouth  every 8 (eight) hours. Hold for Bp (100/60)   DIGOXIN (LANOXIN) 0.125 MG TABLET    1/2 a tablet by mouth daily *hold for pulse < 60*   FAMOTIDINE (PEPCID) 20 MG TABLET    Take 20 mg by mouth at bedtime.    FENTANYL (DURAGESIC) 25 MCG/HR PATCH    Apply 1 patch topically every 3 days, chart site, remove old patch before applying new patch   HYDRALAZINE (APRESOLINE) 50 MG TABLET    Take 50 mg by mouth 2 (two) times daily.   LORAZEPAM (ATIVAN) 0.5 MG TABLET    Take one tablet by mouth twice daily for anxiety   MAGNESIUM HYDROXIDE (MILK OF MAGNESIA) 400 MG/5ML SUSPENSION    Take 30 mLs by mouth daily as needed for mild constipation.   METOPROLOL TARTRATE (LOPRESSOR) 25 MG TABLET    Take 3 tablets (75 mg total) by mouth 2 (two) times daily.   MIRTAZAPINE (REMERON) 15 MG TABLET    Take 15 mg by mouth at bedtime.   OMEPRAZOLE (PRILOSEC) 20 MG CAPSULE    Take 20 mg by mouth daily.   ONDANSETRON (ZOFRAN) 4 MG TABLET    Take 4 mg by mouth. Take one every 6 hours as needed for nausea or vomiting   POLYETHYLENE GLYCOL (MIRALAX / GLYCOLAX) PACKET    Take 17 g by mouth daily. And daily PRN constipation (standing order).   PROTEIN SUPPLEMENT (PROMOD) POWD    Take 1 scoop by mouth daily. In 120 cc of either Boost, Resource 2.0,  Or any Breeze drink every day per resident preference.   TRAMADOL (ULTRAM) 50 MG TABLET    Take 1 tablet (50 mg total) by mouth every 6 (six) hours as needed.   VITAMIN B-12 (CYANOCOBALAMIN) 500 MCG TABLET    Take 500  mcg by mouth every morning. Reported on 05/11/2015  Modified Medications   No medications on file  Discontinued Medications   No medications on file    Review of Systems  Constitutional: Negative for fever, chills and diaphoresis.  HENT: Positive for hearing loss. Negative for congestion and ear pain.   Eyes: Negative for pain, discharge and redness.  Respiratory: Negative for cough, shortness of breath, wheezing and stridor.        O2 desaturation  Cardiovascular: Negative for chest pain, palpitations and leg swelling.  Gastrointestinal: Positive for nausea. Negative for vomiting, abdominal pain, diarrhea and constipation.  Genitourinary: Positive for frequency. Negative for dysuria and urgency.  Musculoskeletal: Positive for back pain. Negative for myalgias and neck pain.       Left hip/pelvis pain with weight bearing sustained from the fall  Skin: Negative for rash.  Neurological: Positive for weakness. Negative for dizziness, tremors, seizures and headaches.       Staring. Poorly responsive. No verbal response.  Psychiatric/Behavioral: Positive for confusion. Negative for suicidal ideas and hallucinations. The patient is nervous/anxious.     Filed Vitals:   05/26/15 1656  BP: 120/86  Pulse: 105  Temp: 97.2 F (36.2 C)  Resp: 20  SpO2: 95%   There is no weight on file to calculate BMI. There were no vitals filed for this visit.   Physical Exam  Constitutional: She is oriented to person, place, and time. No distress.  Thin, frail  HENT:  Head: Normocephalic and atraumatic.  Mouth/Throat: No oropharyngeal exudate.  Eyes: Conjunctivae and EOM are normal. Pupils are equal, round, and reactive to light. Right eye exhibits no discharge. Left eye exhibits no discharge.  Neck: Normal range of motion. Neck supple. No JVD present. No thyromegaly present.  Cardiovascular: Normal rate, regular rhythm and normal heart sounds.   No murmur heard. Pulmonary/Chest: Effort normal and  breath sounds normal. No respiratory distress. She has no wheezes. She has no rales.  Abdominal: Soft. Bowel sounds are normal. She exhibits no distension. There is no tenderness.  Right inguinal hernia  Musculoskeletal: She exhibits tenderness (lower back). She exhibits no edema.  Left hip/pelvis pain with weight bearing since the fall 01/16/15  Neurological: She is alert and oriented to person, place, and time. She has normal reflexes. No cranial nerve deficit. She exhibits normal muscle tone. Coordination normal.  Blinks on confrontation. Staring and not verbally responsive.  Skin: Skin is warm and dry. No rash noted. She is not diaphoretic. No erythema.  Psychiatric: She has a normal mood and affect. Her behavior is normal. Thought content normal. Her mood appears not anxious. Her affect is not angry, not blunt, not labile and not inappropriate. Her speech is not rapid and/or pressured, not delayed, not tangential and not slurred. She is not agitated, not aggressive, not hyperactive, not withdrawn, not actively hallucinating and not combative. Thought content is not paranoid and not delusional. Cognition and memory are impaired. She does not exhibit a depressed mood. She is communicative. She exhibits abnormal recent memory.    Labs reviewed: Lab Summary Latest Ref Rng 05/18/2015 05/16/2015 05/16/2015 05/13/2015 03/29/2015  Hemoglobin 12.0 - 16.0 g/dL (None) (None) (None) 11.0(A) 9.9(A)  Hematocrit 36 - 46 % (None) (None) (None) 34(A) 31(A)  White count - (None) (None) (None) 6.7 6.3  Platelet count 150 - 399 K/L (None) (None) (None) 174 213  Sodium 137 - 147 mmol/L 134(A) 140 140 141 141  Potassium 3.4 - 5.3 mmol/L 4.7 5.2 5.2 4.9 4.1  Calcium - (None) (None) (None) (None) (None)  Phosphorus - (None) (None) (None) (None) (None)  Creatinine 0.5 - 1.1 mg/dL 1.8(A) 2.5(A) 2.5(A) 2.3(A) 1.4(A)  AST 13 - 35 U/L (None) (None) (None) 12(A) 13  Alk Phos 25 - 125 U/L (None) (None) (None) 65 71    Bilirubin - (None) (None) (None) (None) (None)  Glucose - 82 89 89 97 112  Cholesterol - (None) (None) (None) (None) (None)  HDL cholesterol - (None) (None) (None) (None) (None)  Triglycerides - (None) (None) (None) (None) (None)  LDL Direct - (None) (None) (None) (None) (None)  LDL Calc - (None) (None) (None) (None) (None)  Total protein - (None) (None) (None) (None) (None)  Albumin - (None) (None) (None) (None) (None)   Lab Results  Component Value Date   BUN 50* 05/18/2015   Lab Results  Component Value Date   HGBA1C  07/27/2010    5.6 (NOTE)                                                                       According to the ADA Clinical Practice Recommendations for 2011, when HbA1c is used as a screening test:   >=6.5%   Diagnostic of Diabetes Mellitus           (if abnormal result  is confirmed)  5.7-6.4%   Increased risk of developing Diabetes Mellitus  References:Diagnosis and Classification of Diabetes Mellitus,Diabetes LGXQ,1194,17(EYCXK  1):S62-S69 and Standards of Medical Care in         Diabetes - 2011,Diabetes GSPJ,2419,91  (Suppl 1):S11-S61.   Lab Results  Component Value Date   TSH 2.57 01/07/2015     Assessment/Plan  This patient appears to be dying at this point. There was an initial attempt at IV hydration, but her wishes were clear that she did not want a protracted state of dying. There is no oral intake of any substance now.  1. Acute combined systolic and diastolic congestive heart failure ([place]) Currently controlled  2. CKD (chronic kidney disease) stage 3, GFR 30-59 ml/min Significant decrease in kidney function related to poor oral intake  3. Fracture of left pelvis, sequela Remains painful with movement of the patient

## 2015-06-14 NOTE — Assessment & Plan Note (Signed)
Unable to take po meds, discontinue Bp meds.

## 2015-06-14 NOTE — Assessment & Plan Note (Signed)
Hospice service, comfort measures only, dc all other po meds, po as tolerated.

## 2015-06-14 NOTE — Assessment & Plan Note (Signed)
Continue Fentanyl transdermal patch for pain.

## 2015-06-14 NOTE — Progress Notes (Signed)
Patient ID: [name], female   DOB: 1918/11/18, 82 y.o.   MRN: [id number]  Location:  SNF [facility] Provider:  Chipper Oman NP  Code Status:  DNR Goals of care: Advanced Directive information    Chief Complaint  Patient presents with  . Medical Management of Chronic Issues  . Acute Visit    end of life care, FTT     HPI: Patient is a 82 y.o. female seen in the SNF at [facility] today for evaluation of malaise, O2 desaturation, AKI, creat 2s, overloaded after IVF, the patient and POA desires comfort measures only, Hospice service.  GERD, Hx of pancreatitis. Hx of HTN,  back pain, depression, left hip pain ED eval 01/17/15 CT showed IMPRESSION: Fractures of the left inferior and superior pubic rami. No acute abnormality of the proximal femur.  Review of Systems  Constitutional: Positive for malaise/fatigue. Negative for fever, chills and diaphoresis.  HENT: Positive for hearing loss. Negative for congestion and ear pain.   Eyes: Negative for pain, discharge and redness.  Respiratory: Negative for cough, shortness of breath, wheezing and stridor.        O2 desaturation  Cardiovascular: Negative for chest pain, palpitations and leg swelling.  Gastrointestinal: Positive for heartburn and nausea. Negative for vomiting, abdominal pain, diarrhea and constipation.       Said better since she takes medicine with food.   Genitourinary: Positive for frequency. Negative for dysuria and urgency.  Musculoskeletal: Positive for back pain, joint pain and falls. Negative for myalgias and neck pain.       New onset left hip/pelvis pain with weight bearing sustained from the fall  Skin: Negative for rash.  Neurological: Positive for weakness. Negative for dizziness, tremors, seizures and headaches.  Psychiatric/Behavioral: Positive for memory loss. Negative for suicidal ideas and hallucinations. The patient is nervous/anxious.        Staff reported the patient has increased confusion in the  past a 2-3 days    Past Medical History  Diagnosis Date  . Anxiety   . GERD (gastroesophageal reflux disease)   . Anemia   . Hyperlipidemia   . Back pain   . Hypertension   . Hypothyroidism   . Atrial fibrillation (HCC)   . Hyponatremia     hx of  . Syncope     hx syncope and collapse per paper from [facility]  . Unconscious (HCC) 04/06/12    per [facility] paper -hx unconscious episodes -pt asked not to be sent to hospital for these  . Fx lumbar vertebra-closed (HCC)     hx of per [facility]  . Lower back pain     hx of  . Renal insufficiency 06/07/2014  . A-fib (HCC) 12/16/2012    12/28/13 Digoxin level 0.5   . Abdominal pain 05/31/2014  . CKD (chronic kidney disease) stage 3, GFR 30-59 ml/min 05/31/2014  . Compression fracture of L2 lumbar vertebra with routine healing 06/02/2014     Most likely due to thoracic compression fractures, she does have mild compression fracture of T11 and L2. - Discussed the case with neurosurgery Dr. Lovell Sheehan and he recommended narcotics and a brace - Physical therapy evaluated the patient and recommended home PT - Her pain was controlled with Vicodin and fentanyl.    . Compression fracture of thoracic vertebra, non-traumatic (HCC) 06/02/2014  . Constipation 12/16/2012  . Depression 08/19/2012    Mirtazapine 7.5mg  nightly helped.    Marland Kitchen Hx of pancreatitis 04/06/2012  10/12/13 Amylase 50(0-105) Lipase 11(0-75) 11/23/13 Amylase 39. Lipase 14.    . Protein-calorie malnutrition, severe (HCC) 06/08/2014  . Acute combined systolic and diastolic congestive heart failure (HCC) 05/22/2015    Patient Active Problem List   Diagnosis Date Noted  . Acute combined systolic and diastolic congestive heart failure (HCC) 05/22/2015  . FTT (failure to thrive) in adult 05/18/2015  . Hypoxemia 05/18/2015  . Anemia, chronic disease 04/13/2015  . Fracture of left pelvis (HCC) 01/26/2015  . Infection of urinary tract 01/12/2015  . Subacute confusional state  01/05/2015  . Nausea without vomiting 07/14/2014  . Hyperkalemia 06/15/2014  . Protein-calorie malnutrition, severe (HCC) 06/08/2014  . Renal insufficiency 06/07/2014  . Compression fracture of thoracic vertebra, non-traumatic (HCC) 06/02/2014  . Compression fracture of L2 lumbar vertebra with routine healing 06/02/2014  . Abdominal pain 05/31/2014  . CKD (chronic kidney disease) stage 3, GFR 30-59 ml/min 05/31/2014  . Hypothyroidism   . Constipation 12/16/2012  . A-fib (HCC) 12/16/2012  . Hyponatremia 08/19/2012  . GERD (gastroesophageal reflux disease) 08/19/2012  . Back pain 08/19/2012  . Depression 08/19/2012  . Essential hypertension 04/10/2012  . Abnormal ECG 04/10/2012  . Hx of pancreatitis 04/06/2012    Allergies  Allergen Reactions  . Codeine Other (See Comments)    Unknown reaction. Info from a MAR.  Marland Kitchen Codeine     Unknown.    Medications: Patient's Medications  New Prescriptions   No medications on file  Previous Medications   ACETAMINOPHEN (TYLENOL) 325 MG TABLET    Take 650 mg by mouth every 4 (four) hours as needed for mild pain or fever.   BRIMONIDINE (ALPHAGAN) 0.2 % OPHTHALMIC SOLUTION    Place 1 drop into both eyes 2 (two) times daily.   CALCIUM & MAGNESIUM CARBONATES (MYLANTA PO)    Take by mouth. 30 ml by mouth every four hours as needed   CHOLECALCIFEROL (VITAMIN D) 2000 UNITS TABLET    Take 2,000 Units by mouth every morning.   CLONIDINE (CATAPRES) 0.1 MG TABLET    Take 0.1 mg by mouth every 8 (eight) hours. Hold for Bp (100/60)   DIGOXIN (LANOXIN) 0.125 MG TABLET    1/2 a tablet by mouth daily *hold for pulse < 60*   FAMOTIDINE (PEPCID) 20 MG TABLET    Take 20 mg by mouth at bedtime.    FENTANYL (DURAGESIC) 25 MCG/HR PATCH    Apply 1 patch topically every 3 days, chart site, remove old patch before applying new patch   HYDRALAZINE (APRESOLINE) 50 MG TABLET    Take 50 mg by mouth 2 (two) times daily.   LORAZEPAM (ATIVAN) 0.5 MG TABLET    Take one tablet  by mouth twice daily for anxiety   MAGNESIUM HYDROXIDE (MILK OF MAGNESIA) 400 MG/5ML SUSPENSION    Take 30 mLs by mouth daily as needed for mild constipation.   METOPROLOL TARTRATE (LOPRESSOR) 25 MG TABLET    Take 3 tablets (75 mg total) by mouth 2 (two) times daily.   MIRTAZAPINE (REMERON) 15 MG TABLET    Take 15 mg by mouth at bedtime.   OMEPRAZOLE (PRILOSEC) 20 MG CAPSULE    Take 20 mg by mouth daily.   ONDANSETRON (ZOFRAN) 4 MG TABLET    Take 4 mg by mouth. Take one every 6 hours as needed for nausea or vomiting   POLYETHYLENE GLYCOL (MIRALAX / GLYCOLAX) PACKET    Take 17 g by mouth daily. And daily PRN constipation (standing order).  PROTEIN SUPPLEMENT (PROMOD) POWD    Take 1 scoop by mouth daily. In 120 cc of either Boost, Resource 2.0,  Or any Breeze drink every day per resident preference.   TRAMADOL (ULTRAM) 50 MG TABLET    Take 1 tablet (50 mg total) by mouth every 6 (six) hours as needed.   VITAMIN B-12 (CYANOCOBALAMIN) 500 MCG TABLET    Take 500 mcg by mouth every morning. Reported on 05/11/2015  Modified Medications   No medications on file  Discontinued Medications   No medications on file    Physical Exam: There were no vitals filed for this visit. There is no weight on file to calculate BMI.  Physical Exam  Constitutional: She is oriented to person, place, and time. She appears well-developed and well-nourished. No distress.  HENT:  Head: Normocephalic and atraumatic.  Mouth/Throat: No oropharyngeal exudate.  Eyes: Conjunctivae and EOM are normal. Pupils are equal, round, and reactive to light. Right eye exhibits no discharge. Left eye exhibits no discharge.  Neck: Normal range of motion. Neck supple. No JVD present. No thyromegaly present.  Cardiovascular: Normal rate, regular rhythm and normal heart sounds.   No murmur heard. Pulmonary/Chest: Effort normal and breath sounds normal. No respiratory distress. She has no wheezes. She has no rales.  Abdominal: Soft. Bowel  sounds are normal. She exhibits no distension. There is no tenderness.  Right inguinal hernia  Musculoskeletal: She exhibits tenderness (lower back). She exhibits no edema.  Left hip/pelvis pain with weight bearing since the fall 01/16/15  Neurological: She is alert and oriented to person, place, and time. She has normal reflexes. No cranial nerve deficit. She exhibits normal muscle tone. Coordination normal.  Skin: Skin is warm and dry. No rash noted. She is not diaphoretic. No erythema.  Psychiatric: She has a normal mood and affect. Her behavior is normal. Thought content normal. Her mood appears not anxious. Her affect is not angry, not blunt, not labile and not inappropriate. Her speech is not rapid and/or pressured, not delayed, not tangential and not slurred. She is not agitated, not aggressive, not hyperactive, not withdrawn, not actively hallucinating and not combative. Thought content is not paranoid and not delusional. Cognition and memory are impaired. She does not exhibit a depressed mood. She is communicative. She exhibits abnormal recent memory.    Labs reviewed: Basic Metabolic Panel:  Recent Labs  96/04/54 0400 06/09/14 0354 06/10/14 0645  05/13/15 05/16/15 05/18/15  NA 135 134* 137  < > 141 140  140 134*  K 4.0 3.4* 4.3  < > 4.9 5.2  5.2 4.7  CL 100 100 102  --   --   --   --   CO2 25 25 27   --   --   --   --   GLUCOSE 89 93 118*  --   --   --   --   BUN 29* 21 21  < > 51* 59*  59* 50*  CREATININE 1.00 0.91 0.99  < > 2.3* 2.5*  2.5* 1.8*  CALCIUM 8.6 8.3* 8.8  --   --   --   --   < > = values in this interval not displayed.  Liver Function Tests:  Recent Labs  06/01/14 0510  06/07/14 1651 06/08/14 0400  01/07/15 03/29/15 05/13/15  AST 18  < > 24 21  < > 18 13 12*  ALT 9  < > 15 12  < > 10 6* 6*  ALKPHOS 78  < >  134* 115  < > 75 71 65  BILITOT 0.9  --  0.7 0.6  --   --   --   --   PROT 6.2  --  6.9 6.2  --   --   --   --   ALBUMIN 3.2*  --  3.6 3.2*  --   --    --   --   < > = values in this interval not displayed.  CBC:  Recent Labs  06/01/14 0510  06/07/14 1651  06/08/14 0927 06/09/14 0354 06/10/14 0645 01/07/15 03/29/15 05/13/15  WBC 7.5  < > 10.9*  < > 8.0 7.8 11.6* 6.7 6.3 6.7  NEUTROABS 5.0  --  8.2*  --  5.4  --   --   --   --   --   HGB 10.9*  < > 12.7  < > 10.9* 11.7* 12.3 10.2* 9.9* 11.0*  HCT 33.1*  < > 38.1  < > 33.4* 35.8* 36.9 33* 31* 34*  MCV 97.6  --  95.3  < > 96.0 96.2 96.6  --   --   --   PLT 241  < > 240  < > 215 216 268 219 213 174  < > = values in this interval not displayed.  Lab Results  Component Value Date   TSH 2.57 01/07/2015   Lab Results  Component Value Date   HGBA1C  07/27/2010    5.6 (NOTE)                                                                       According to the ADA Clinical Practice Recommendations for 2011, when HbA1c is used as a screening test:   >=6.5%   Diagnostic of Diabetes Mellitus           (if abnormal result  is confirmed)  5.7-6.4%   Increased risk of developing Diabetes Mellitus  References:Diagnosis and Classification of Diabetes Mellitus,Diabetes Care,2011,34(Suppl 1):S62-S69 and Standards of Medical Care in         Diabetes - 2011,Diabetes Care,2011,34  (Suppl 1):S11-S61.   Lab Results  Component Value Date   CHOL  07/28/2010    111        ATP III CLASSIFICATION:  <200     mg/dL   Desirable  161-096  mg/dL   Borderline High  >=045    mg/dL   High          HDL 29* 07/28/2010   LDLCALC  07/28/2010    56        Total Cholesterol/HDL:CHD Risk Coronary Heart Disease Risk Table                     Men   Women  1/2 Average Risk   3.4   3.3  Average Risk       5.0   4.4  2 X Average Risk   9.6   7.1  3 X Average Risk  23.4   11.0        Use the calculated Patient Ratio above and the CHD Risk Table to determine the patient's CHD Risk.        ATP III CLASSIFICATION (LDL):  <100  mg/dL   Optimal  161-096  mg/dL   Near or Above                    Optimal   130-159  mg/dL   Borderline  045-409  mg/dL   High  >811     mg/dL   Very High   TRIG 914 07/28/2010   CHOLHDL 3.8 07/28/2010    Significant Diagnostic Results since last visit: hx of left superior and inferior ramus fx.   Patient Care Team: Kimber Relic, MD as PCP - General (Internal Medicine) Kimber Relic, MD (Internal Medicine)  Assessment/Plan Problem List Items Addressed This Visit    Essential hypertension - Primary (Chronic)    Unable to take po meds, discontinue Bp meds.       A-fib (HCC) (Chronic)    Prn Morphine, Lorazepam, Atropine ophthalmic sol available for comfort measures.       Back pain    Continue Fentanyl transdermal patch for pain.       FTT (failure to thrive) in adult    Hospice service, comfort measures only, dc all other po meds, po as tolerated.           Family/ staff Communication: Hospice Service.   Labs/tests ordered: no further diagnotic studies.   [facility] Kostas Marrow NP Geriatrics [facility] [phone] [address] [place], [place] [address] On Call:  [phone] & follow prompts after 5pm & weekends Office Phone:  [phone] Office Fax:  [phone]

## 2015-06-14 NOTE — Assessment & Plan Note (Signed)
Prn Morphine, Lorazepam, Atropine ophthalmic sol available for comfort measures.

## 2015-06-14 DEATH — deceased

## 2015-07-06 ENCOUNTER — Encounter: Payer: Self-pay | Admitting: Nurse Practitioner

## 2016-06-10 IMAGING — CR DG HIP (WITH OR WITHOUT PELVIS) 2-3V*L*
3 series · 3 of 3 positions shown · non-contrast
Comparison: None.

CLINICAL DATA: Status post fall, left hip pain

EXAM:
DG HIP (WITH OR WITHOUT PELVIS) 2-3V LEFT

[x pelvis (1 of 2)]
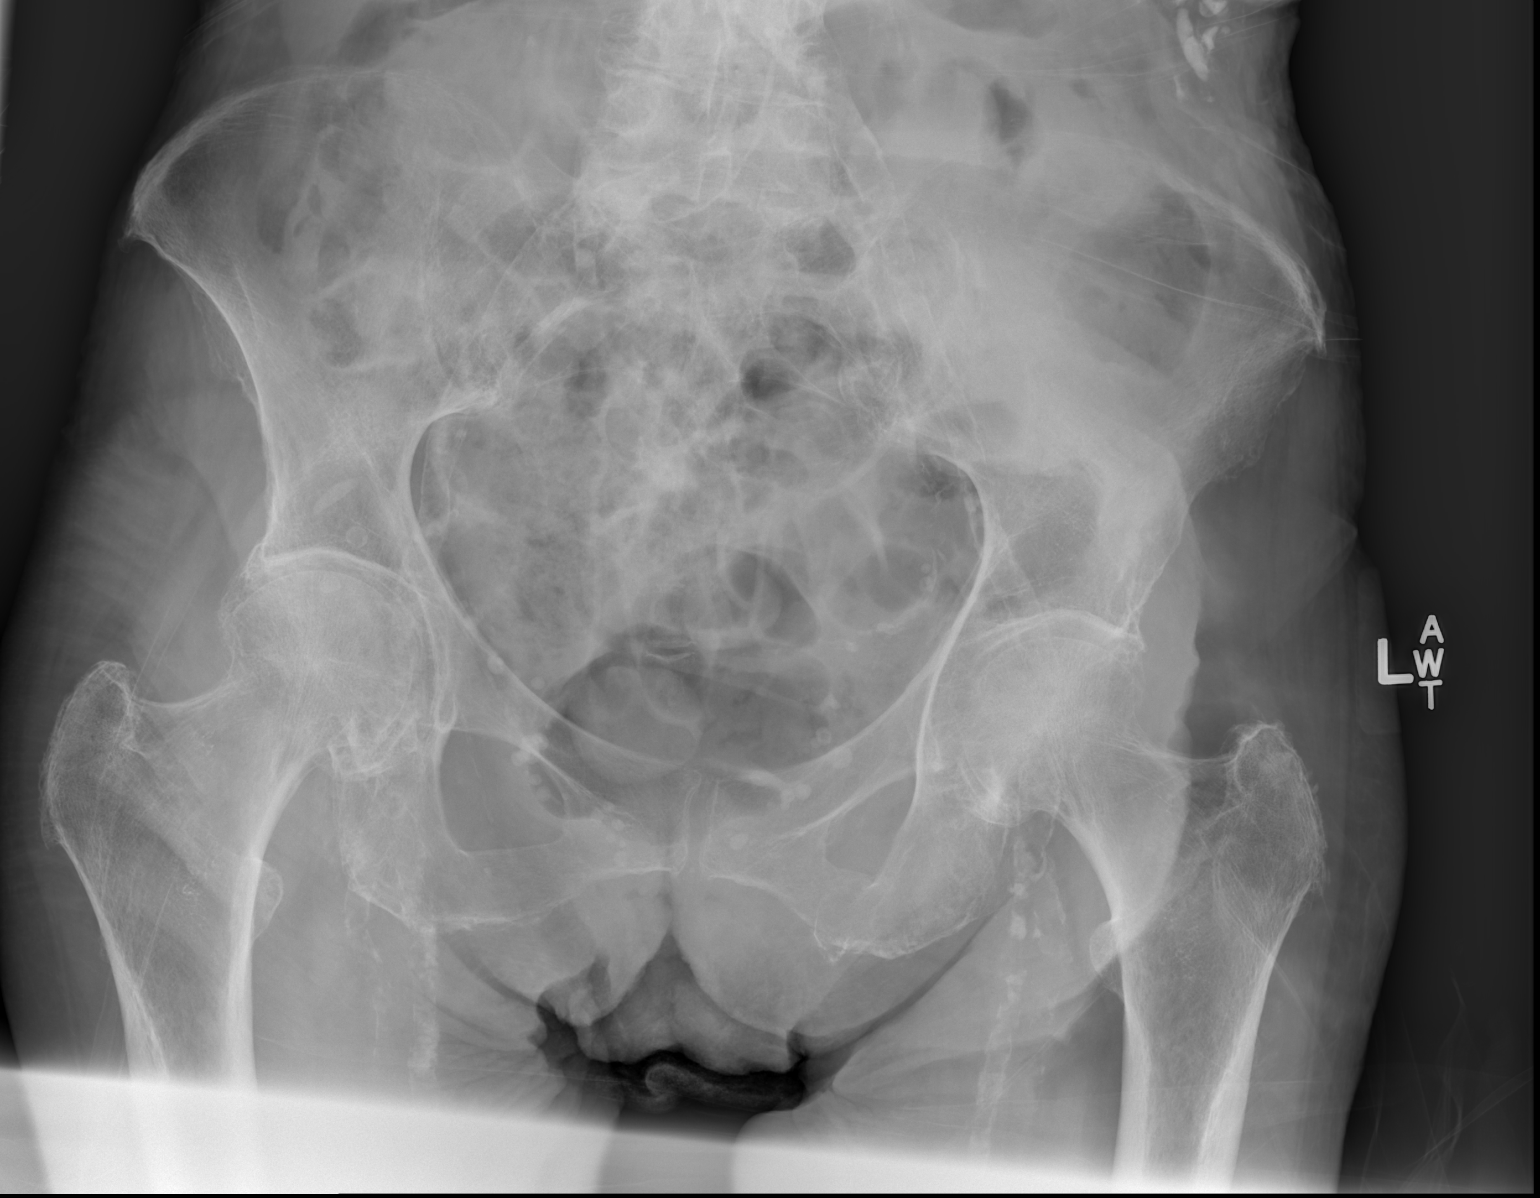

[x pelvis (2 of 2)]
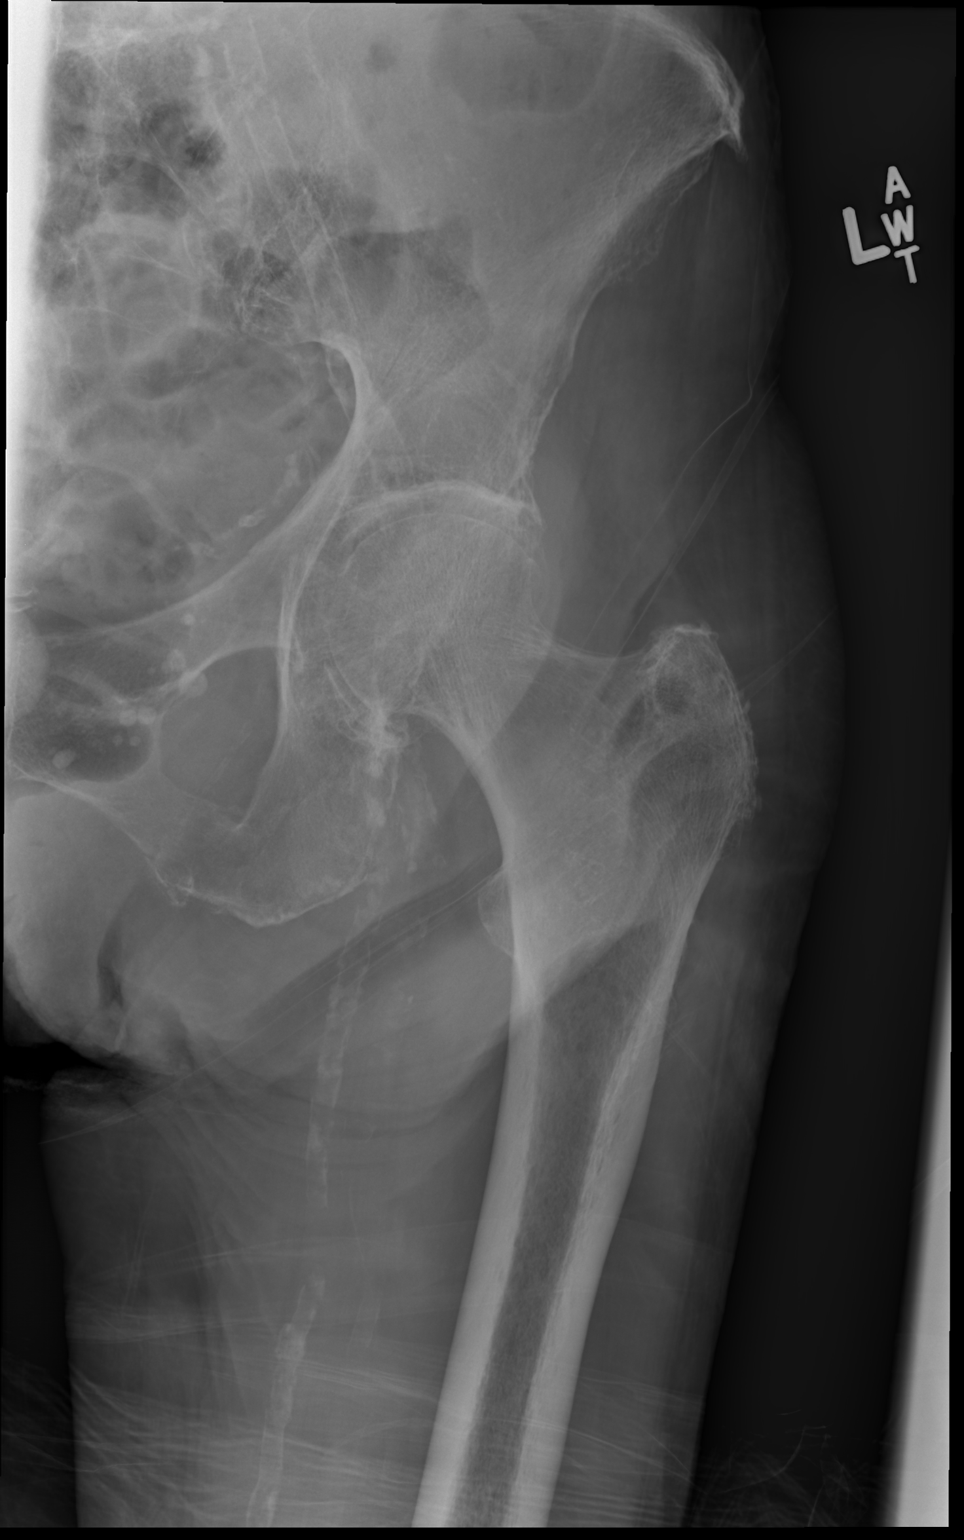

[w hip lat left]
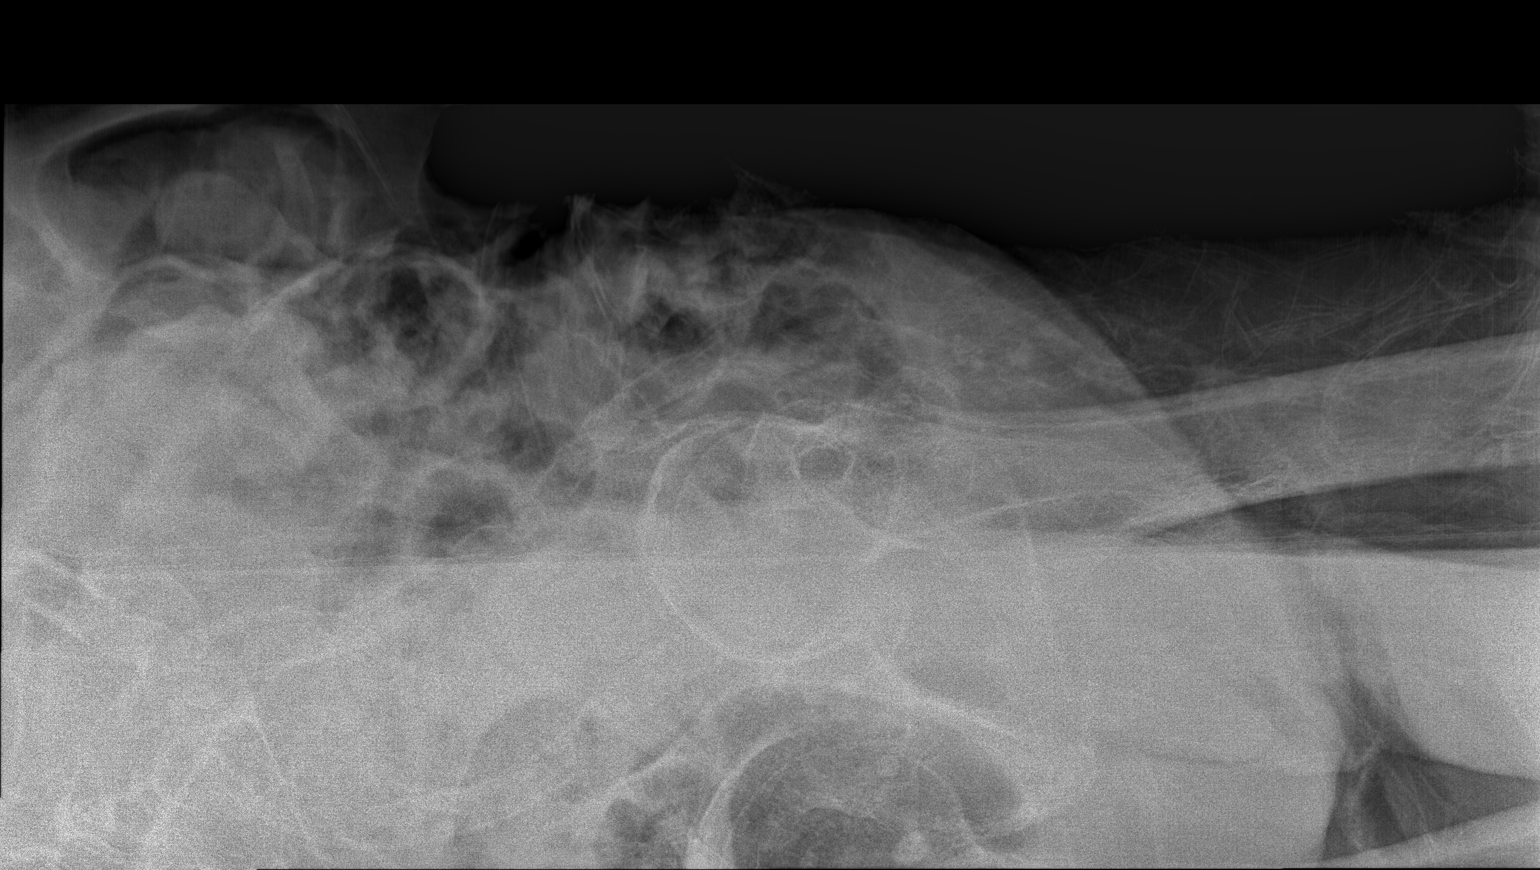

[3 of 3 positions shown; findings below may reference images not displayed]

FINDINGS: There is generalized osteopenia. There is no acute hip fracture or
dislocation. There is a small cortical lucency involving the left
inferior pubic ramus concerning for nondisplaced fracture. There is
no aggressive lytic or sclerotic osseous lesion. There is peripheral
vascular atherosclerotic disease.
IMPRESSION: 1. No acute hip fracture or dislocation. Given the patient's age and
osteopenia, if there is persistent clinical concern for an occult
hip fracture, a MRI of the hip is recommended for increased
sensitivity.
2. Small cortical lucency involving the left inferior pubic ramus
concerning for a nondisplaced fracture.
